# Patient Record
Sex: Female | Born: 1950
Health system: Southern US, Community
[De-identification: ages and names within clinical notes are randomized; demographics above are authoritative.]

## PROBLEM LIST (undated history)

## (undated) DIAGNOSIS — E785 Hyperlipidemia, unspecified: Secondary | ICD-10-CM

## (undated) DIAGNOSIS — M8580 Other specified disorders of bone density and structure, unspecified site: Secondary | ICD-10-CM

## (undated) DIAGNOSIS — H269 Unspecified cataract: Secondary | ICD-10-CM

## (undated) DIAGNOSIS — F419 Anxiety disorder, unspecified: Secondary | ICD-10-CM

## (undated) DIAGNOSIS — K219 Gastro-esophageal reflux disease without esophagitis: Secondary | ICD-10-CM

## (undated) DIAGNOSIS — T7840XA Allergy, unspecified, initial encounter: Secondary | ICD-10-CM

## (undated) HISTORY — DX: Gastro-esophageal reflux disease without esophagitis: K21.9

## (undated) HISTORY — PX: EYE SURGERY: SHX253

## (undated) HISTORY — DX: Other specified disorders of bone density and structure, unspecified site: M85.80

## (undated) HISTORY — DX: Unspecified cataract: H26.9

## (undated) HISTORY — DX: Hyperlipidemia, unspecified: E78.5

## (undated) HISTORY — DX: Allergy, unspecified, initial encounter: T78.40XA

## (undated) HISTORY — PX: BREAST EXCISIONAL BIOPSY: SUR124

## (undated) HISTORY — PX: OTHER SURGICAL HISTORY: SHX169

---

## 1998-10-31 ENCOUNTER — Ambulatory Visit (HOSPITAL_COMMUNITY): Admission: RE | Admit: 1998-10-31 | Discharge: 1998-10-31 | Payer: Self-pay | Admitting: Obstetrics and Gynecology

## 1998-10-31 ENCOUNTER — Encounter: Payer: Self-pay | Admitting: Obstetrics and Gynecology

## 1998-11-02 ENCOUNTER — Other Ambulatory Visit: Admission: RE | Admit: 1998-11-02 | Discharge: 1998-11-02 | Payer: Self-pay | Admitting: Obstetrics and Gynecology

## 1999-04-12 ENCOUNTER — Other Ambulatory Visit: Admission: RE | Admit: 1999-04-12 | Discharge: 1999-04-12 | Payer: Self-pay | Admitting: Obstetrics and Gynecology

## 2000-05-13 ENCOUNTER — Ambulatory Visit (HOSPITAL_COMMUNITY): Admission: RE | Admit: 2000-05-13 | Discharge: 2000-05-13 | Payer: Self-pay | Admitting: Obstetrics and Gynecology

## 2000-05-13 ENCOUNTER — Encounter: Payer: Self-pay | Admitting: Obstetrics and Gynecology

## 2001-10-01 ENCOUNTER — Encounter: Payer: Self-pay | Admitting: Family Medicine

## 2001-10-01 ENCOUNTER — Ambulatory Visit (HOSPITAL_COMMUNITY): Admission: RE | Admit: 2001-10-01 | Discharge: 2001-10-01 | Payer: Self-pay | Admitting: Family Medicine

## 2003-01-03 ENCOUNTER — Encounter: Payer: Self-pay | Admitting: Family Medicine

## 2003-01-03 ENCOUNTER — Ambulatory Visit (HOSPITAL_COMMUNITY): Admission: RE | Admit: 2003-01-03 | Discharge: 2003-01-03 | Payer: Self-pay | Admitting: Family Medicine

## 2004-01-25 ENCOUNTER — Ambulatory Visit (HOSPITAL_COMMUNITY): Admission: RE | Admit: 2004-01-25 | Discharge: 2004-01-25 | Payer: Self-pay | Admitting: Family Medicine

## 2005-01-16 ENCOUNTER — Ambulatory Visit: Payer: Self-pay | Admitting: Family Medicine

## 2005-01-17 ENCOUNTER — Other Ambulatory Visit: Admission: RE | Admit: 2005-01-17 | Discharge: 2005-01-17 | Payer: Self-pay | Admitting: Internal Medicine

## 2005-01-17 ENCOUNTER — Ambulatory Visit: Payer: Self-pay | Admitting: Family Medicine

## 2005-02-19 DIAGNOSIS — Z780 Asymptomatic menopausal state: Secondary | ICD-10-CM | POA: Insufficient documentation

## 2005-02-19 DIAGNOSIS — M8580 Other specified disorders of bone density and structure, unspecified site: Secondary | ICD-10-CM

## 2005-02-19 DIAGNOSIS — M899 Disorder of bone, unspecified: Secondary | ICD-10-CM

## 2005-02-19 DIAGNOSIS — M949 Disorder of cartilage, unspecified: Secondary | ICD-10-CM

## 2005-02-19 HISTORY — DX: Other specified disorders of bone density and structure, unspecified site: M85.80

## 2005-03-05 ENCOUNTER — Encounter: Admission: RE | Admit: 2005-03-05 | Discharge: 2005-03-05 | Payer: Self-pay | Admitting: Family Medicine

## 2005-03-13 ENCOUNTER — Ambulatory Visit: Payer: Self-pay | Admitting: Family Medicine

## 2005-03-18 ENCOUNTER — Ambulatory Visit: Payer: Self-pay | Admitting: Family Medicine

## 2005-08-07 ENCOUNTER — Ambulatory Visit: Payer: Self-pay | Admitting: Family Medicine

## 2005-10-07 ENCOUNTER — Ambulatory Visit: Payer: Self-pay | Admitting: Family Medicine

## 2005-10-24 ENCOUNTER — Ambulatory Visit: Payer: Self-pay

## 2005-11-20 ENCOUNTER — Ambulatory Visit: Payer: Self-pay | Admitting: Family Medicine

## 2006-01-13 ENCOUNTER — Ambulatory Visit: Payer: Self-pay | Admitting: Family Medicine

## 2006-07-22 ENCOUNTER — Encounter (INDEPENDENT_AMBULATORY_CARE_PROVIDER_SITE_OTHER): Payer: Self-pay | Admitting: Internal Medicine

## 2006-07-23 ENCOUNTER — Other Ambulatory Visit: Admission: RE | Admit: 2006-07-23 | Discharge: 2006-07-23 | Payer: Self-pay | Admitting: Family Medicine

## 2006-07-23 ENCOUNTER — Ambulatory Visit: Payer: Self-pay | Admitting: Family Medicine

## 2006-07-23 ENCOUNTER — Encounter (INDEPENDENT_AMBULATORY_CARE_PROVIDER_SITE_OTHER): Payer: Self-pay | Admitting: Internal Medicine

## 2006-08-13 ENCOUNTER — Ambulatory Visit: Payer: Self-pay | Admitting: Internal Medicine

## 2006-08-13 ENCOUNTER — Encounter: Admission: RE | Admit: 2006-08-13 | Discharge: 2006-08-13 | Payer: Self-pay | Admitting: Family Medicine

## 2006-08-22 DIAGNOSIS — K5730 Diverticulosis of large intestine without perforation or abscess without bleeding: Secondary | ICD-10-CM

## 2006-08-22 HISTORY — PX: COLONOSCOPY: SHX174

## 2006-08-29 ENCOUNTER — Ambulatory Visit: Payer: Self-pay | Admitting: Internal Medicine

## 2006-10-15 ENCOUNTER — Ambulatory Visit: Payer: Self-pay | Admitting: Family Medicine

## 2006-10-15 LAB — CONVERTED CEMR LAB
Cholesterol: 206 mg/dL (ref 0–200)
HDL: 86.5 mg/dL (ref >39.0)
Total CHOL/HDL Ratio: 2.4
Triglycerides: 85 mg/dL (ref 0–149)

## 2006-10-21 ENCOUNTER — Ambulatory Visit: Payer: Self-pay | Admitting: Family Medicine

## 2007-04-09 ENCOUNTER — Encounter (INDEPENDENT_AMBULATORY_CARE_PROVIDER_SITE_OTHER): Payer: Self-pay | Admitting: Internal Medicine

## 2007-04-09 DIAGNOSIS — R32 Unspecified urinary incontinence: Secondary | ICD-10-CM

## 2007-04-09 DIAGNOSIS — E785 Hyperlipidemia, unspecified: Secondary | ICD-10-CM

## 2007-06-08 ENCOUNTER — Ambulatory Visit: Payer: Self-pay | Admitting: Family Medicine

## 2007-06-08 DIAGNOSIS — F4321 Adjustment disorder with depressed mood: Secondary | ICD-10-CM | POA: Insufficient documentation

## 2007-06-30 ENCOUNTER — Ambulatory Visit (HOSPITAL_BASED_OUTPATIENT_CLINIC_OR_DEPARTMENT_OTHER): Admission: RE | Admit: 2007-06-30 | Discharge: 2007-06-30 | Payer: Self-pay | Admitting: Urology

## 2007-08-07 ENCOUNTER — Telehealth (INDEPENDENT_AMBULATORY_CARE_PROVIDER_SITE_OTHER): Payer: Self-pay | Admitting: *Deleted

## 2007-08-24 ENCOUNTER — Telehealth (INDEPENDENT_AMBULATORY_CARE_PROVIDER_SITE_OTHER): Payer: Self-pay | Admitting: Internal Medicine

## 2007-09-08 ENCOUNTER — Ambulatory Visit: Payer: Self-pay | Admitting: Family Medicine

## 2007-09-08 DIAGNOSIS — N951 Menopausal and female climacteric states: Secondary | ICD-10-CM

## 2007-09-09 ENCOUNTER — Encounter (INDEPENDENT_AMBULATORY_CARE_PROVIDER_SITE_OTHER): Payer: Self-pay | Admitting: Internal Medicine

## 2007-09-29 ENCOUNTER — Telehealth (INDEPENDENT_AMBULATORY_CARE_PROVIDER_SITE_OTHER): Payer: Self-pay | Admitting: Internal Medicine

## 2007-10-05 ENCOUNTER — Telehealth (INDEPENDENT_AMBULATORY_CARE_PROVIDER_SITE_OTHER): Payer: Self-pay | Admitting: Internal Medicine

## 2007-10-08 ENCOUNTER — Encounter (INDEPENDENT_AMBULATORY_CARE_PROVIDER_SITE_OTHER): Payer: Self-pay | Admitting: Internal Medicine

## 2007-10-12 ENCOUNTER — Encounter (INDEPENDENT_AMBULATORY_CARE_PROVIDER_SITE_OTHER): Payer: Self-pay | Admitting: Internal Medicine

## 2007-10-12 ENCOUNTER — Other Ambulatory Visit: Admission: RE | Admit: 2007-10-12 | Discharge: 2007-10-12 | Payer: Self-pay | Admitting: Family Medicine

## 2007-10-12 ENCOUNTER — Ambulatory Visit: Payer: Self-pay | Admitting: Family Medicine

## 2007-10-13 ENCOUNTER — Encounter (INDEPENDENT_AMBULATORY_CARE_PROVIDER_SITE_OTHER): Payer: Self-pay | Admitting: Internal Medicine

## 2007-10-19 ENCOUNTER — Encounter (INDEPENDENT_AMBULATORY_CARE_PROVIDER_SITE_OTHER): Payer: Self-pay | Admitting: Internal Medicine

## 2007-10-19 ENCOUNTER — Encounter (INDEPENDENT_AMBULATORY_CARE_PROVIDER_SITE_OTHER): Payer: Self-pay | Admitting: *Deleted

## 2007-11-03 ENCOUNTER — Encounter (INDEPENDENT_AMBULATORY_CARE_PROVIDER_SITE_OTHER): Payer: Self-pay | Admitting: *Deleted

## 2007-11-03 ENCOUNTER — Encounter (INDEPENDENT_AMBULATORY_CARE_PROVIDER_SITE_OTHER): Payer: Self-pay | Admitting: Internal Medicine

## 2007-11-13 ENCOUNTER — Ambulatory Visit: Payer: Self-pay | Admitting: Family Medicine

## 2007-11-13 ENCOUNTER — Encounter (INDEPENDENT_AMBULATORY_CARE_PROVIDER_SITE_OTHER): Payer: Self-pay | Admitting: Internal Medicine

## 2007-11-16 LAB — CONVERTED CEMR LAB
AST: 19 units/L (ref 0–37)
Basophils Absolute: 0 10*3/uL (ref 0.0–0.1)
Basophils Relative: 0.1 % (ref 0.0–1.0)
CO2: 31 meq/L (ref 19–32)
Creatinine, Ser: 0.7 mg/dL (ref 0.4–1.2)
Eosinophils Absolute: 0.4 10*3/uL (ref 0.0–0.7)
Eosinophils Relative: 5.1 % — ABNORMAL HIGH (ref 0.0–5.0)
GFR calc Af Amer: 112 mL/min
GFR calc non Af Amer: 93 mL/min
Glucose, Bld: 100 mg/dL — ABNORMAL HIGH (ref 70–99)
HDL: 78.1 mg/dL (ref >39.0)
Hemoglobin: 14.8 g/dL (ref 12.0–15.0)
MCHC: 35.5 g/dL (ref 30.0–36.0)
MCV: 97.4 fL (ref 78.0–100.0)
Monocytes Absolute: 0.6 10*3/uL (ref 0.1–1.0)
Monocytes Relative: 7.9 % (ref 3.0–12.0)
Neutro Abs: 4.9 10*3/uL (ref 1.4–7.7)
Neutrophils Relative %: 66 % (ref 43.0–77.0)
Platelets: 254 10*3/uL (ref 150–400)
Potassium: 4.3 meq/L (ref 3.5–5.1)
WBC: 7.4 10*3/uL (ref 4.5–10.5)

## 2007-11-18 LAB — CONVERTED CEMR LAB: Vit D, 1,25-Dihydroxy: 30 (ref 30–89)

## 2007-12-28 ENCOUNTER — Telehealth (INDEPENDENT_AMBULATORY_CARE_PROVIDER_SITE_OTHER): Payer: Self-pay | Admitting: Internal Medicine

## 2008-01-31 ENCOUNTER — Encounter: Payer: Self-pay | Admitting: Family Medicine

## 2008-03-10 ENCOUNTER — Encounter (INDEPENDENT_AMBULATORY_CARE_PROVIDER_SITE_OTHER): Payer: Self-pay | Admitting: Internal Medicine

## 2008-03-15 ENCOUNTER — Telehealth: Payer: Self-pay | Admitting: Family Medicine

## 2008-03-30 ENCOUNTER — Telehealth (INDEPENDENT_AMBULATORY_CARE_PROVIDER_SITE_OTHER): Payer: Self-pay | Admitting: Internal Medicine

## 2008-04-01 ENCOUNTER — Telehealth (INDEPENDENT_AMBULATORY_CARE_PROVIDER_SITE_OTHER): Payer: Self-pay | Admitting: Internal Medicine

## 2008-04-15 ENCOUNTER — Telehealth: Payer: Self-pay | Admitting: Family Medicine

## 2008-04-21 ENCOUNTER — Ambulatory Visit: Payer: Self-pay | Admitting: Family Medicine

## 2008-04-21 DIAGNOSIS — Z888 Allergy status to other drugs, medicaments and biological substances status: Secondary | ICD-10-CM

## 2008-04-21 DIAGNOSIS — J309 Allergic rhinitis, unspecified: Secondary | ICD-10-CM | POA: Insufficient documentation

## 2008-05-03 ENCOUNTER — Telehealth (INDEPENDENT_AMBULATORY_CARE_PROVIDER_SITE_OTHER): Payer: Self-pay | Admitting: Internal Medicine

## 2008-05-17 ENCOUNTER — Ambulatory Visit: Payer: Self-pay | Admitting: Family Medicine

## 2008-05-20 LAB — CONVERTED CEMR LAB
Albumin: 4.1 g/dL (ref 3.5–5.2)
HDL: 82.1 mg/dL (ref >39.0)
Total Protein: 6.7 g/dL (ref 6.0–8.3)
VLDL: 12 mg/dL (ref 0–40)

## 2008-08-23 ENCOUNTER — Ambulatory Visit: Payer: Self-pay | Admitting: Family Medicine

## 2008-08-23 DIAGNOSIS — F411 Generalized anxiety disorder: Secondary | ICD-10-CM

## 2008-08-23 DIAGNOSIS — F419 Anxiety disorder, unspecified: Secondary | ICD-10-CM | POA: Insufficient documentation

## 2008-09-12 ENCOUNTER — Telehealth (INDEPENDENT_AMBULATORY_CARE_PROVIDER_SITE_OTHER): Payer: Self-pay | Admitting: Internal Medicine

## 2008-09-22 ENCOUNTER — Telehealth (INDEPENDENT_AMBULATORY_CARE_PROVIDER_SITE_OTHER): Payer: Self-pay | Admitting: Internal Medicine

## 2008-11-02 ENCOUNTER — Telehealth (INDEPENDENT_AMBULATORY_CARE_PROVIDER_SITE_OTHER): Payer: Self-pay | Admitting: Internal Medicine

## 2009-02-24 ENCOUNTER — Telehealth (INDEPENDENT_AMBULATORY_CARE_PROVIDER_SITE_OTHER): Payer: Self-pay | Admitting: Internal Medicine

## 2009-10-18 ENCOUNTER — Encounter: Payer: Self-pay | Admitting: Family Medicine

## 2009-10-18 LAB — CONVERTED CEMR LAB: LDL Cholesterol: 105 mg/dL

## 2010-02-28 ENCOUNTER — Telehealth: Payer: Self-pay | Admitting: Family Medicine

## 2010-03-08 ENCOUNTER — Encounter: Payer: Self-pay | Admitting: Family Medicine

## 2010-03-19 ENCOUNTER — Ambulatory Visit: Payer: Self-pay | Admitting: Family Medicine

## 2010-03-19 ENCOUNTER — Other Ambulatory Visit: Admission: RE | Admit: 2010-03-19 | Discharge: 2010-03-19 | Payer: Self-pay | Admitting: Family Medicine

## 2010-03-19 DIAGNOSIS — R5383 Other fatigue: Secondary | ICD-10-CM

## 2010-03-19 DIAGNOSIS — R5381 Other malaise: Secondary | ICD-10-CM

## 2010-03-19 LAB — CONVERTED CEMR LAB
BUN: 11 mg/dL (ref 6–23)
Basophils Relative: 0.4 % (ref 0.0–3.0)
Calcium: 9 mg/dL (ref 8.4–10.5)
Chloride: 103 meq/L (ref 96–112)
Eosinophils Absolute: 0.2 10*3/uL (ref 0.0–0.7)
Eosinophils Relative: 4.3 % (ref 0.0–5.0)
Folate: 19.4 ng/mL
Monocytes Absolute: 0.5 10*3/uL (ref 0.1–1.0)
Monocytes Relative: 8.3 % (ref 3.0–12.0)
Neutrophils Relative %: 56.7 % (ref 43.0–77.0)
Platelets: 230 10*3/uL (ref 150.0–400.0)
Potassium: 4.3 meq/L (ref 3.5–5.1)
TSH: 1.51 microintl units/mL (ref 0.35–5.50)
Vitamin B-12: 384 pg/mL (ref 211–911)

## 2010-03-27 ENCOUNTER — Encounter: Payer: Self-pay | Admitting: Family Medicine

## 2010-08-23 NOTE — Assessment & Plan Note (Signed)
Summary: PAP SMEAR AND CPX/BILLIE'S PT/CLE   Vital Signs:  Patient profile:   60 year old female Height:      60.75 inches Weight:      123.38 pounds BMI:     23.59 Temp:     98.2 degrees F oral Pulse rate:   68 / minute Pulse rhythm:   regular BP sitting:   114 / 80  (left arm) Cuff size:   regular  Vitals Entered By: Sydell Axon LPN (March 19, 2010 8:23 AM) CC: 30 Minute checkup with pap   History of Present Illness: 60 yo female new to me here for CPX/Pap.  Hyperlipidemia- well controlled on Simvastatin 20 mg daily.  Denies any myalgias.  Brings in blood work from 09/2009.  Well woman- menopausal for past 3 years.  On Estratest daily for past 3 years, helped with hotflashes and vaginal dryness. No h/o abnormal pap smears. Due for mammogram.  Fatigue- does feel more fatigued than usual although she has been "running around a lot." No CP, SOB, DOE, HA or blurred vision. No weight loss or night sweats.  Current Medications (verified): 1)  Simvastatin 20 Mg  Tabs (Simvastatin) .... Take 1 Tablet By Mouth Once A Day 2)  Calcium 600/vitamin D 600-200 Mg-Unit  Tabs (Calcium Carbonate-Vitamin D) .... Take 1 Tablet By Mouth Two Times A Day 3)  Alprazolam 0.25 Mg  Tabs (Alprazolam) .Marland Kitchen.. 1 Every 6 Hours As Needed For Anxiety By Mouth 4)  Vitamin D 1000 Unit  Tabs (Cholecalciferol) .... Take 1 Tablet By Mouth Once A Day 5)  Flonase 50 Mcg/act Susp (Fluticasone Propionate) .... 2 Sprays Each Nostril Once Daily For Allergic Rhinitis 6)  Estratest Hs .Marland Kitchen.. 1 Once Daily For Hrt  Allergies: 1)  ! * Amoxil  Past History:  Past Medical History: Last updated: 04/09/2007 Osteopenia (02/19/2005) Diverticulosis, colon (08/22/2006) Hyperlipidemia Urinary incontinence  Past Surgical History: Last updated: 04/09/2007 colonoscopy-- 2/08 cardiolite stress test-- neg-- 4/07 bone densometry-- 8/06  Family History: Last updated: 04/09/2007 01/16/05 father living age 22 CVA age 45,  smoked, several MI's/CABG mother-- died age 62-- MI x2,  elevated lipids, smoker PGF died,-- DM gallbladder prob PGM died-- MI age 32 MGM died-- breast CA MGF died MI--  age 81+ 2 brothers living --1 w/ DM, HBP, does not take care of self, 2 MI/stents + CABG, depression/ elevated lipids 1 brother w/ elevated lipids sisters 5 living-- 1 w/ HBP, depression, abd prob., wt loss 90 lbs, smokes/ETOH 1 obese, others l + w  Social History: Last updated: 04/09/2007 walks married 28 years children 2-- (24/22)  Risk Factors: Alcohol Use: <1 (10/12/2007) Caffeine Use: 2 (10/12/2007) Exercise: yes (10/12/2007)  Risk Factors: Smoking Status: never (04/09/2007) Passive Smoke Exposure: no (10/12/2007)  Review of Systems      See HPI General:  Complains of fatigue; denies fever and loss of appetite. Eyes:  Denies blurring. ENT:  Denies difficulty swallowing. CV:  Denies chest pain or discomfort. Resp:  Denies shortness of breath. GI:  Denies abdominal pain, bloody stools, and change in bowel habits. GU:  Denies abnormal vaginal bleeding, discharge, and dysuria. MS:  Denies joint pain, joint redness, and joint swelling. Derm:  Denies rash. Neuro:  Denies headaches. Psych:  Denies anxiety and depression. Endo:  Denies cold intolerance and heat intolerance. Heme:  Denies abnormal bruising and bleeding.  Physical Exam  General:  alert, well-developed, well-nourished, and well-hydrated.   Head:  normocephalic, atraumatic, and no abnormalities observed.   Eyes:  pupils equal and pupils round.   Ears:  External ear exam shows no significant lesions or deformities.  Otoscopic examination reveals clear canals, tympanic membranes are intact bilaterally without bulging, retraction, inflammation or discharge. Hearing is grossly normal bilaterally. Nose:  External nasal examination shows no deformity or inflammation. Nasal mucosa are pink and moist without lesions or exudates. Mouth:  no exudates  and pharyngeal erythema.   Breasts:  skin/areolae normal, no masses, no abnormal thickening, no nipple discharge, no tenderness, and no adenopathy.  pendulous Lungs:  Normal respiratory effort, chest expands symmetrically. Lungs are clear to auscultation, no crackles or wheezes. Heart:  normal rate, regular rhythm, no murmur, and no gallop.  r Abdomen:  Bowel sounds positive,abdomen soft and non-tender without masses, organomegaly or hernias noted. Rectal:  no external abnormalities and no hemorrhoids.   Genitalia:  Normal introitus for age, no external lesions, no vaginal discharge, mucosa pink and moist, no vaginal or cervical lesions, no vaginal atrophy, no friaility or hemorrhage, normal uterus size and position, no adnexal masses or tenderness Msk:  No deformity or scoliosis noted of thoracic or lumbar spine.   Extremities:  No clubbing, cyanosis, edema, or deformity noted with normal full range of motion of all joints.   Neurologic:  alert & oriented X3, strength normal in all extremities, sensation intact to light touch, gait normal, and DTRs symmetrical and normal.   Psych:  Cognition and judgment appear intact. Alert and cooperative with normal attention span and concentration. No apparent delusions, illusions, hallucinations   Impression & Recommendations:  Problem # 1:  WELL ADULT EXAM (ICD-V70.0) Reviewed preventive care protocols, scheduled due services, and updated immunizations Discussed nutrition, exercise, diet, and healthy lifestyle.  Pap smear today.    Problem # 2:  FATIGUE (ICD-780.79) Assessment: New Likely due to busy lifestyle, but will check labs to rule out reverisble causes. Orders: Venipuncture ([address]) TLB-CBC Platelet - w/Differential (85025-CBCD) TLB-TSH (Thyroid Stimulating Hormone) (84443-TSH) TLB-BMP (Basic Metabolic Panel-BMET) (80048-METABOL) TLB-B12 + Folate Pnl (60454_09811-B14/NWG)  Complete Medication List: 1)  Simvastatin 20 Mg Tabs  (Simvastatin) .... Take 1 tablet by mouth once a day 2)  Calcium 600/vitamin D 600-200 Mg-unit Tabs (Calcium carbonate-vitamin d) .... Take 1 tablet by mouth two times a day 3)  Alprazolam 0.25 Mg Tabs (Alprazolam) .Marland Kitchen.. 1 every 6 hours as needed for anxiety by mouth 4)  Vitamin D 1000 Unit Tabs (Cholecalciferol) .... Take 1 tablet by mouth once a day 5)  Flonase 50 Mcg/act Susp (Fluticasone propionate) .... 2 sprays each nostril once daily for allergic rhinitis 6)  Estratest Hs  .Marland Kitchen.. 1 once daily for hrt  Current Allergies (reviewed today): ! * AMOXIL  Flu Vaccine Next Due:  Not Indicated Last HDL:  82.1 (05/17/2008 8:28:00 AM) HDL Result Date:  10/18/2009 HDL Result:  83 HDL Next Due:  1 yr Last LDL:  DEL (05/17/2008 8:28:00 AM) LDL Result Date:  10/18/2009 LDL Result:  105 LDL Next Due:  1 yr Flex Sig Next Due:  Not Indicated Hemoccult Next Due:  Not Indicated Last Mammogram:  normal (11/03/2007 4:39:54 PM) Mammogram Result Date:  03/01/2010 Mammogram Result:  normal Mammogram Next Due:  1 yr  Prevention & Chronic Care Immunizations   Influenza vaccine: Not documented   Influenza vaccine due: Not Indicated    Tetanus booster: 01/16/2005: Td   Tetanus booster due: 01/17/2015    Pneumococcal vaccine: Not documented  Colorectal Screening   Hemoccult: Not documented   Hemoccult due: Not Indicated  Colonoscopy: Abnormal  (08/22/2006)   Colonoscopy due: 08/2016  Other Screening   Pap smear: normal  (10/08/2007)   Pap smear action/deferral: Ordered  (03/19/2010)   Pap smear due: 10/2008    Mammogram: normal  (03/01/2010)   Mammogram due: 03/02/2011   Smoking status: never  (04/09/2007)  Lipids   Total Cholesterol: 220  (05/17/2008)   Lipid panel action/deferral: Not indicated   LDL: 105  (10/18/2009)   LDL Direct: 113.1  (05/17/2008)   HDL: 83  (10/18/2009)   Triglycerides: 59  (05/17/2008)    SGOT (AST): 21  (05/17/2008)   BMP action: Not indicated   SGPT  (ALT): 19  (05/17/2008)   Alkaline phosphatase: 44  (05/17/2008)   Total bilirubin: 0.8  (05/17/2008)    Lipid flowsheet reviewed?: Yes   Progress toward LDL goal: At goal  Self-Management Support :    Lipid self-management support: Not documented    Nursing Instructions: Pap smear today

## 2010-08-23 NOTE — Progress Notes (Signed)
Summary: needs order for mammogram  Phone Note Call from Patient Call back at Home Phone [phone]   Caller: Patient Call For: Gildardo Griffes FNP Summary of Call: Patient is asking if she could have an order faxed over to the breast center at [facility]. Her appt has already been scheduled, they just need an order.  She has an appt scheduled w/ you on 03-08-10.  Initial call taken by: Melody Comas,  February 28, 2010 1:53 PM

## 2010-08-23 NOTE — Letter (Signed)
Summary: Results Follow up Letter  [place] [facility]  [address] [place], [place] [address]   Phone: [phone]  Fax: [phone]    03/27/2010 MRN: [id number]  [facility] [address] [place], [place]  [address]  Dear Ms. Pettus,  The following are the results of your recent test(s):  Test         Result    Pap Smear:        Normal __X___  Not Normal _____ Comments: ______________________________________________________ Cholesterol: LDL(Bad cholesterol):         Your goal is less than:         HDL (Good cholesterol):       Your goal is more than: Comments:  ______________________________________________________ Mammogram:        Normal _____  Not Normal _____ Comments:  ___________________________________________________________________ Hemoccult:        Normal _____  Not normal _______ Comments:    _____________________________________________________________________ Other Tests:    We routinely do not discuss normal results over the telephone.  If you desire a copy of the results, or you have any questions about this information we can discuss them at your next office visit.   Sincerely,      Ruthe Mannan, MD

## 2010-11-26 ENCOUNTER — Telehealth: Payer: Self-pay | Admitting: *Deleted

## 2010-11-26 MED ORDER — CONJ ESTROG-MEDROXYPROGEST ACE 0.3-1.5 MG PO TABS: 1.0000 | ORAL_TABLET | Freq: Every day | ORAL | Status: DC

## 2010-11-26 NOTE — Telephone Encounter (Signed)
Patient has been taking Estratest for the past 3 years, but she is asking if for this months she could try prempro to see how that works for her. Uses [facility] [address]

## 2010-11-26 NOTE — Telephone Encounter (Signed)
Rx sent 

## 2010-11-26 NOTE — Telephone Encounter (Signed)
Patient advised as instructed via telephone, Rx sent to pharmacy.

## 2010-11-27 ENCOUNTER — Telehealth: Payer: Self-pay | Admitting: *Deleted

## 2010-11-27 NOTE — Telephone Encounter (Signed)
Opened in error

## 2010-12-04 ENCOUNTER — Ambulatory Visit (INDEPENDENT_AMBULATORY_CARE_PROVIDER_SITE_OTHER): Payer: BC Managed Care – PPO | Admitting: Family Medicine

## 2010-12-04 ENCOUNTER — Encounter: Payer: Self-pay | Admitting: Family Medicine

## 2010-12-04 VITALS — BP 130/90 | HR 79 | Temp 98.7°F | Ht 60.5 in | Wt 122.8 lb

## 2010-12-04 DIAGNOSIS — F4321 Adjustment disorder with depressed mood: Secondary | ICD-10-CM

## 2010-12-04 DIAGNOSIS — F411 Generalized anxiety disorder: Secondary | ICD-10-CM

## 2010-12-04 LAB — HM PAP SMEAR

## 2010-12-04 LAB — HM MAMMOGRAPHY

## 2010-12-04 MED ORDER — VITAMIN D 1000 UNITS PO TABS: 1000.0000 [IU] | ORAL_TABLET | Freq: Every day | ORAL | Status: DC

## 2010-12-04 MED ORDER — CALCIUM CARBONATE-VITAMIN D 600-400 MG-UNIT PO TABS: 1.0000 | ORAL_TABLET | Freq: Two times a day (BID) | ORAL | Status: DC

## 2010-12-04 MED ORDER — BIOTIN 1000 MCG PO TABS: 1000.0000 ug | ORAL_TABLET | Freq: Every day | ORAL | Status: DC

## 2010-12-04 MED ORDER — CITALOPRAM HYDROBROMIDE 10 MG PO TABS: 10.0000 mg | ORAL_TABLET | Freq: Every day | ORAL | Status: DC

## 2010-12-04 NOTE — Progress Notes (Signed)
60 yo here for depression.  Has never been on any antidepressants. Husband was deployed overseas for 3rd time and this time has been harder on her. They have less communication with each other so she is worried. Not having panic attacks but does feel anxious. Frequently tearful. No SI or HI. Sleeping ok. Does not want psychotherapy--says she has a good support system in her family and friends.  The PMH, PSH, Social History, Family History, Medications, and allergies have been reviewed in [facility], and have been updated if relevant.   Review of Systems       See HPI General:  Complains of fatigue; denies fever and loss of appetite. Eyes:  Denies blurring. ENT:  Denies difficulty swallowing. CV:  Denies chest pain or discomfort. Resp:  Denies shortness of breath. GI:  Denies abdominal pain, bloody stools, and change in bowel habits. GU:  Denies abnormal vaginal bleeding, discharge, and dysuria. MS:  Denies joint pain, joint redness, and joint swelling. Derm:  Denies rash. Neuro:  Denies headaches. Psych:  Complains of anxiety and depression. Denies SI or HI Endo:  Denies cold intolerance and heat intolerance. Heme:  Denies abnormal bruising and bleeding.  Physical Exam BP 130/90  Pulse 79  Temp(Src) 98.7 F (37.1 C) (Oral)  Ht 5' 0.5" (1.537 m)  Wt 122 lb 12.8 oz (55.702 kg)  BMI 23.59 kg/m2  General:  alert, well-developed, well-nourished, and well-hydrated.   Head:  normocephalic, atraumatic, and no abnormalities observed.   Eyes:  pupils equal and pupils round.   Lungs:  Normal respiratory effort, chest expands symmetrically. Lungs are clear to auscultation, no crackles or wheezes. Heart:  normal rate, regular rhythm, no murmur, and no gallop.  r Abdomen:  Bowel sounds positive,abdomen soft and non-tender without masses, organomegaly or hernias noted. Extremities:  No clubbing, cyanosis, edema, or deformity noted with normal full range of motion of all joints.   Neurologic:   alert & oriented X3, strength normal in all extremities, sensation intact to light touch, gait normal, and DTRs symmetrical and normal.   Psych:  Cognition and judgment appear intact. Alert and cooperative with normal attention span and concentration. No apparent delusions, illusions, hallucinations  A/P- adjustment disorder with mixed features. >25 min spent with face to face with patient counseling and coordinating care Will start low dose Celexa 10 mg daily. Pt to call me in a few weeks with an update. See pt instruction for details.

## 2010-12-04 NOTE — Patient Instructions (Signed)
Have fun at the graduation this weekend. Please call me in a few weeks to let me know how you're feeling.  IMPORTANT: HOW TO USE THIS INFORMATION:  This is a summary and does NOT have all possible information about this product. This information does not assure that this product is safe, effective, or appropriate for you. This information is not individual medical advice and does not substitute for the advice of your health care professional. Always ask your health care professional for complete information about this product and your specific health needs.    CITALOPRAM - ORAL (sye-TAL-oh-pram)    COMMON BRAND NAME(S): Celexa    WARNING:  Antidepressant medications are used to treat a variety of conditions, including depression and other mental/mood disorders. These medications can help prevent suicidal thoughts/attempts and provide other important benefits. However, studies have shown that a small number of people (especially people younger than 17) who take antidepressants for any condition may experience worsening depression, other mental/mood symptoms, or suicidal thoughts/attempts. Therefore, it is very important to talk with the doctor about the risks and benefits of antidepressant medication (especially for people younger than 25), even if treatment is not for a mental/mood condition. Tell the doctor immediately if you notice worsening depression/other psychiatric conditions, unusual behavior changes (including possible suicidal thoughts/attempts), or other mental/mood changes (including new/worsening anxiety, panic attacks, trouble sleeping, irritability, hostile/angry feelings, impulsive actions, severe restlessness, very rapid speech). Be especially watchful for these symptoms when a new antidepressant is started or when the dose is changed.    USES:  Citalopram is an antidepressant (selective serotonin reuptake inhibitor-SSRI) used to treat depression. It works by restoring the balance of  certain natural substances (neurotransmitters such as serotonin) in the brain. Citalopram may improve your feelings of well-being and energy level.    OTHER USES:  This section contains uses of this drug that are not listed in the approved professional labeling for the drug but that may be prescribed by your health care professional. Use this drug for a condition that is listed in this section only if it has been so prescribed by your health care professional. This drug is also used to treat other mental conditions (obsessive-compulsive disorder, panic disorder).    HOW TO USE:  Read the Medication Guide provided by your pharmacist before you start using citalopram and each time you get a refill. If you have any questions, consult your doctor or pharmacist. Take this medication once daily in the morning or evening, with or without food or as directed by your doctor. The dosage is based on your medical condition and response to treatment. If you are using the liquid form of this medication, measure the dose carefully using a special measuring device/spoon. Do not use a household spoon because you may not get the correct dose. To reduce your risk of side effects, your doctor may direct you to start taking this drug at a low dose and gradually increase your dose. Follow your doctor's instructions carefully. Do not take more or less medication or take it more frequently than prescribed. Your condition will not improve any faster, and your risk of side effects will increase. Use this medication regularly in order to get the most benefit from it. To help you remember, use it at the same time each day. It is important to continue taking this medication even if you feel well. Do not stop taking this medication without consulting your doctor. Some conditions may become worse when the drug is suddenly  stopped. Your dose may need to be gradually decreased. This medication may cause withdrawal reactions, especially if it  has been used regularly for a long time or in high doses. In such cases, withdrawal symptoms (such as nervousness, headache, numbness, tingling, trouble sleeping, nightmares) may occur if you suddenly stop using this medication. To prevent withdrawal reactions, your doctor may reduce your dose gradually. Consult your doctor or pharmacist for more details, and report any withdrawal reactions immediately. It may take 1 to 2 weeks to feel a benefit from this drug and 4 weeks to feel the full benefit of this medication. Tell your doctor if your condition persists or worsens.    SIDE EFFECTS:  See also Warning section. Nausea, dry mouth, trouble sleeping, loss of appetite, weakness, tiredness, drowsiness, dizziness, increased sweating, blurred vision, or yawning may occur. If any of these effects persist or worsen, tell your doctor promptly. Remember that your doctor has prescribed this medication because he or she has judged that the benefit to you is greater than the risk of side effects. Many people using this medication do not have serious side effects. Tell your doctor immediately if any of these unlikely but serious side effects occur: unusual or severe mental/mood changes (e.g., nervousness, unusual high energy/excitement, rare thoughts of suicide), shakiness (tremor), decreased interest in sex, changes in sexual ability. Tell your doctor immediately if any of these rare but very serious side effects occur: stomach pain, fainting, bloody/black/tarry stools, vomit that looks like coffee grounds, easy bruising/bleeding, muscle weakness/cramps, seizures, fast/irregular/pounding heartbeat, difficulty urinating, change in the amount of urine. This medication may rarely cause a very serious condition called serotonin syndrome. The risk increases when this medication is used with certain other drugs such as "triptans" used to treat migraine headaches (e.g., sumatriptan, eletriptan), certain antidepressants including  other SSRIs (e.g., fluoxetine, paroxetine) and SNRIs (e.g., venlafaxine), lithium, tramadol, tryptophan, or a certain drug to treat obesity (sibutramine). See also Drug Interactions section. Before taking this drug, tell your doctor if you take any of these medications. Serotonin syndrome may be more likely when you start or increase the dose of any of these medications. Seek immediate medical attention if you develop some of the following symptoms: hallucinations, restlessness, loss of coordination, fast heartbeat, severe dizziness, unexplained fever, severe nausea/vomiting/diarrhea, twitchy muscles. For males, in the very unlikely event you have a painful or prolonged erection lasting 4 or more hours, stop using this drug and seek immediate medical attention, or permanent problems could occur. A very serious allergic reaction to this drug is rare. However, seek immediate medical attention if you notice any symptoms of a serious allergic reaction, including: rash, itching/swelling (especially of the face/tongue/throat), severe dizziness, trouble breathing. This is not a complete list of possible side effects. If you notice other effects not listed above, contact your doctor or pharmacist. In the [place] - Call your doctor for medical advice about side effects. You may report side effects to FDA at 1-800-FDA-1088. In [place] - Call your doctor for medical advice about side effects. You may report side effects to [facility] [place] at [phone].    PRECAUTIONS:  Before taking citalopram, tell your doctor or pharmacist if you are allergic to it; or to escitalopram; or if you have any other allergies. This product may contain inactive ingredients, which can cause allergic reactions or other problems. Talk to your pharmacist for more details. Before using this medication, tell your doctor or pharmacist your medical history, especially of: personal or family history of  psychiatric disorders (e.g., bipolar/manic-depressive  disorder), personal or family history of suicide attempts, bleeding problems, liver disease, seizures, severe kidney disease, stomach bleeding, severe loss of body water (dehydration), low sodium in the blood (hyponatremia). This drug may make you dizzy or drowsy. Do not drive, use machinery, or do any activity that requires alertness until you are sure you can perform such activities safely. Avoid alcoholic beverages. Caution is advised when using this drug in the elderly because they may be more sensitive to its effects. The elderly are more likely to lose too much salt (hyponatremia), especially if they are also taking "water pills" (diuretics) with this medication. Use this medication only when clearly needed during pregnancy. It may harm an unborn baby. Also, babies born to mothers who have used this drug during the last 3 months of pregnancy may infrequently develop withdrawal symptoms such as feeding/breathing difficulties, seizures, muscle stiffness, or constant crying. If you notice any of these symptoms in your newborn, tell the doctor promptly. Since untreated depression can be a serious condition, do not stop taking this medication unless directed by your doctor. If you are planning pregnancy, become pregnant, or think you may be pregnant, immediately discuss the benefits and risks of using this medication during pregnancy with your doctor. This drug passes into breast milk and may have undesirable effects on a nursing infant. Consult your doctor before breast-feeding.    DRUG INTERACTIONS:  Your doctor or pharmacist may already be aware of any possible drug interactions and may be monitoring you for them. Do not start, stop, or change the dosage of any medicine before checking with your doctor or pharmacist first. Taking certain medications with this product could result in serious (rarely fatal) drug interactions. Avoid taking MAO inhibitors (e.g., furazolidone, isocarboxazid, linezolid, moclobemide,  phenelzine, procarbazine, rasagiline, selegiline, tranylcypromine) with citalopram for 2 weeks before treatment, during treatment, or for 2 weeks after your last dose of citalopram. This drug should not be used with the following medications because very serious interactions may occur: pimozide, tryptophan, weight loss drugs (e.g., sibutramine, phentermine). If you are currently using any of these medications, tell your doctor or pharmacist before starting citalopram. Before using this medication, tell your doctor or pharmacist of all prescription and nonprescription/herbal products you may use, especially of: cimetidine, metoprolol, "water pills" (diuretics such as furosemide), drugs that can cause bleeding/bruising (e.g., aspirin, antiplatelet drugs such as clopidogrel, NSAIDs such as ibuprofen, "blood thinners" such as heparin/warfarin). Aspirin can increase the risk of bleeding when used with this medication (see above). If your doctor has directed you to take low-dose aspirin for heart attack or stroke prevention (usually at dosages of 81-325 milligrams a day), you should continue taking it unless your doctor instructs you otherwise. Discuss the risks and benefits with your doctor. Also tell your doctor if you take any other drugs that increase serotonin, such as buspirone, dextromethorphan, lithium, meperidine, propoxyphene, phentermine, other SSRIs (e.g., paroxetine), SNRIs (e.g., duloxetine), tryptophan, St. John's wort, drugs used to treat migraines such as "triptans" and dihydroergotamine, street drugs such as MDMA/"ecstasy," amphetamine. (See also Side Effects section.) Tell your doctor or pharmacist if you also take drugs that cause drowsiness, such as certain antihistamines (e.g., diphenhydramine), anti-seizure drugs (e.g., carbamazepine), medicine for sleep or anxiety (e.g., lorazepam, zolpidem), muscle relaxants, narcotic pain relievers (e.g., codeine), psychiatric medicines (e.g., chlorpromazine,  quetiapine, nortriptyline, trazodone). Check the labels on all your medicines (e.g., cough-and-cold products) because they may contain ingredients that cause drowsiness. Cimetidine is a nonprescription drug  that is commonly used to treat extra stomach acid. Because it may cause undesirable interactions when used with citalopram, ask your pharmacist about other products to treat stomach acid. This document does not contain all possible interactions. Therefore, before using this product, tell your doctor or pharmacist of all the products you use. Keep a list of all your medications with you, and share the list with your doctor and pharmacist.    OVERDOSE:  If overdose is suspected, contact your local poison control center or emergency room immediately. [place] residents can call the [place] National Poison Hotline at 848-155-3500. [place] residents can call a Technical sales engineer center.    NOTES:  Do not share this medication with others. Psychiatric/medical check-ups and possibly lab tests should be done periodically to monitor your progress or check for side effects. Consult your doctor for more details.    MISSED DOSE:  If you miss a dose, take it as soon as you remember. If it is near the time of the next dose, skip the missed dose and resume your usual dosing schedule. Do not double the dose to catch up.    STORAGE:  Store at room temperature at 77 degrees F (25 degrees C) away from light and moisture. Brief storage between 59-86 degrees F (15-30 degrees C) is permitted. Do not store in the bathroom. Keep all medicines away from children and pets. Do not flush medications down the toilet or pour them into a drain unless instructed to do so. Properly discard this product when it is expired or no longer needed. Consult your pharmacist or local waste disposal company for more details about how to safely discard your product.    Information last revised October 2010 Copyright(c) 2010 [employer].

## 2010-12-04 NOTE — Op Note (Signed)
NAMEAMYRE, Patricia Hampton           ACCOUNT NO.:  [id number]   MEDICAL RECORD NO.:  [id number]          PATIENT TYPE:  AMB   LOCATION:  [facility]                         FACILITY:  [facility]   PHYSICIAN:  Martina Sinner, MD DATE OF BIRTH:  11/24/1950   DATE OF PROCEDURE:  06/30/2007  DATE OF DISCHARGE:                               OPERATIVE REPORT   PREOPERATIVE DIAGNOSIS:  Stress incontinence.   POSTOPERATIVE DIAGNOSES:  Stress incontinence.   SURGERY:  Sling, cystourethropexy (SPARC plus cystoscopy).   SURGEON:  Dr. Lorin Picket MacDiarmid   Patricia Hampton has stress urinary incontinence.  She consented to a  sling.   The patient was prepped and draped in the usual fashion.  Preoperative  lab and blood were, of course, normal.  She was given preoperative  antibiotics.  Extra care was taken when we positioned her legs to  minimize the risk of compartment syndrome and DVT and neuropathy.   Two 1 cm incisions were made 0.5 cm lateral to the midline 1  fingerbreadth above the symphysis pubis where she had a previous  Pfannenstiel incision.  I made a 2 cm incision under the mid urethra  after instilling approximately 7 mL of a lidocaine/epinephrine mixture.  I made the incision such that there was a thick pubis urethral flap.  I  dissected sharply to the urethrovesical angle bilaterally.   With the bladder emptied, I passed the [facility] needle on top all the way  in the back of the symphysis pubis parallel to the midline onto the pulp  of my index finger bilaterally.  I cystoscoped the patient.  I felt that  the left needle was a little bit close to the bladder since I could see  it easily, indent the bladder.  For this reason, I placed approximately  0.5 cm more lateral.  I then recystoscoped the patient, and there was no  indentation of the bladder with the flexion of the needle.  It was  effluxing some indigo carmine from both ureteral orifices.  There was no  injury to the urethra.  I  had a very good look inside the bladder, and I  was happy with the position of the trocars.   With the bladder empty, I attached the sling and brought it up through  the retropubic space,  I tensioned it over the fat part of a midsize  Kelly clamp.  A couple of the blue dots, irrigated the seeds, and  removed the seeds.  I was very happy with the hypermobility of the  sling.  I was happy with the tension with no spring-back effect.  It  nicely sat in the mid urethra.  Importantly, she did have a short  urethra, and I was diligent to try to stay in the middle aspect of it.   All incisions were irrigated.  Then 2-0 Vicryl on a CT-1 needle was used  to close the vaginal incisions followed by 1 interrupted suture.  I cut  the sling below the skin and closed the suprapubic incisions with 4-0  Vicryl and Dermabond.   The catheter was draining blue urine at the  end of the case.  The  patient was taken to the recovery room.  Hopefully, this operation will  reach her treatment goal.           ______________________________  Martina Sinner, MD  Electronically Signed     SAM/MEDQ  D:  06/30/2007  T:  06/30/2007  Job:  [id number]

## 2010-12-13 ENCOUNTER — Other Ambulatory Visit: Payer: Self-pay | Admitting: *Deleted

## 2010-12-13 MED ORDER — SIMVASTATIN 20 MG PO TABS: 20.0000 mg | ORAL_TABLET | Freq: Every day | ORAL | Status: DC

## 2010-12-26 ENCOUNTER — Telehealth: Payer: Self-pay | Admitting: *Deleted

## 2010-12-26 NOTE — Telephone Encounter (Signed)
[facility] has faxed forms for new scripts for citalopram, simvastatin, prempro.  Forms are on your desk.  Please check and or correct quantities and refills.

## 2010-12-27 NOTE — Telephone Encounter (Signed)
Completed on my desk

## 2011-01-17 MED ORDER — CITALOPRAM HYDROBROMIDE 10 MG PO TABS: 10.0000 mg | ORAL_TABLET | Freq: Every day | ORAL | Status: DC

## 2011-01-17 MED ORDER — CONJ ESTROG-MEDROXYPROGEST ACE 0.3-1.5 MG PO TABS: 1.0000 | ORAL_TABLET | Freq: Every day | ORAL | Status: DC

## 2011-01-17 MED ORDER — SIMVASTATIN 20 MG PO TABS: 20.0000 mg | ORAL_TABLET | Freq: Every day | ORAL | Status: DC

## 2011-01-21 ENCOUNTER — Telehealth: Payer: Self-pay | Admitting: *Deleted

## 2011-01-21 MED ORDER — CITALOPRAM HYDROBROMIDE 20 MG PO TABS: 20.0000 mg | ORAL_TABLET | Freq: Every day | ORAL | Status: DC

## 2011-01-21 NOTE — Telephone Encounter (Signed)
Rx called to pharmacy, patient notified as instructed via message left on cell phone voicemail.

## 2011-01-21 NOTE — Telephone Encounter (Signed)
Patient is currently taking celexa 10 mg tablets. She says that it is not working and is asking if she could increase it. Uses [facility] [place].

## 2011-01-21 NOTE — Telephone Encounter (Signed)
Yes, I will send 20 mg dose to her pharmacy. Please let me know how she is doing in a few weeks.

## 2011-02-20 ENCOUNTER — Telehealth: Payer: Self-pay | Admitting: *Deleted

## 2011-02-20 NOTE — Telephone Encounter (Signed)
Patient notified

## 2011-02-20 NOTE — Telephone Encounter (Signed)
Pt states she has been taking citalopram for about 2 months, takes 20 mg's a day, and she says this isn't working well for her.  She is asking if she can increase the dose.  [facility] [place]

## 2011-02-20 NOTE — Telephone Encounter (Signed)
Yes try taking two tablets daily- 40 mg. If that helps, we can call in higher dose.

## 2011-04-02 ENCOUNTER — Other Ambulatory Visit: Payer: Self-pay | Admitting: *Deleted

## 2011-04-02 MED ORDER — CONJ ESTROG-MEDROXYPROGEST ACE 0.3-1.5 MG PO TABS: 1.0000 | ORAL_TABLET | Freq: Every day | ORAL | Status: DC

## 2011-04-02 NOTE — Telephone Encounter (Signed)
Pt is requesting a 90 day supply of prempro be [facility] [place].

## 2011-04-08 NOTE — Telephone Encounter (Signed)
Prempro called to [facility].

## 2011-04-22 ENCOUNTER — Other Ambulatory Visit: Payer: Self-pay | Admitting: *Deleted

## 2011-04-22 NOTE — Telephone Encounter (Signed)
Pt is requesting refill. Advised her that you are out until Wednesday, so you may not see this note till then. Please call pt when this is called in.

## 2011-04-23 MED ORDER — ALPRAZOLAM 0.25 MG PO TABS: 0.2500 mg | ORAL_TABLET | Freq: Four times a day (QID) | ORAL | Status: DC | PRN

## 2011-04-24 NOTE — Telephone Encounter (Signed)
[facility], patient notified as instructed via telephone. 

## 2011-04-29 LAB — TYPE AND SCREEN: ABO/RH(D): A NEG

## 2011-04-29 LAB — CBC
Hemoglobin: 14.7
RBC: 4.42
RDW: 11.5
WBC: 6

## 2011-04-29 LAB — ABO/RH: ABO/RH(D): A NEG

## 2011-04-29 LAB — PROTIME-INR
INR: 0.9
Prothrombin Time: 12.1

## 2011-04-29 LAB — APTT: aPTT: 27

## 2011-06-25 ENCOUNTER — Telehealth: Payer: Self-pay | Admitting: *Deleted

## 2011-06-25 NOTE — Telephone Encounter (Signed)
Yes I will

## 2011-06-25 NOTE — Telephone Encounter (Signed)
[facility] is called to advise that patient is scheduled for a screening mammogram on 07/02/11 and wants to make sure that you will accept the report. This does not require an order from a physician, but does need a doctor to confirm that they will receive the report.

## 2011-06-25 NOTE — Telephone Encounter (Signed)
Spoke with Herbert Seta at [facility] and advised her as instructed.

## 2011-07-02 ENCOUNTER — Encounter: Payer: Self-pay | Admitting: Family Medicine

## 2011-07-02 ENCOUNTER — Ambulatory Visit (INDEPENDENT_AMBULATORY_CARE_PROVIDER_SITE_OTHER): Payer: BC Managed Care – PPO | Admitting: Family Medicine

## 2011-07-02 DIAGNOSIS — N951 Menopausal and female climacteric states: Secondary | ICD-10-CM

## 2011-07-02 DIAGNOSIS — R6889 Other general symptoms and signs: Secondary | ICD-10-CM

## 2011-07-02 DIAGNOSIS — Z0000 Encounter for general adult medical examination without abnormal findings: Secondary | ICD-10-CM

## 2011-07-02 DIAGNOSIS — Z1211 Encounter for screening for malignant neoplasm of colon: Secondary | ICD-10-CM

## 2011-07-02 DIAGNOSIS — F411 Generalized anxiety disorder: Secondary | ICD-10-CM

## 2011-07-02 DIAGNOSIS — E785 Hyperlipidemia, unspecified: Secondary | ICD-10-CM

## 2011-07-02 DIAGNOSIS — M949 Disorder of cartilage, unspecified: Secondary | ICD-10-CM

## 2011-07-02 DIAGNOSIS — R5381 Other malaise: Secondary | ICD-10-CM

## 2011-07-02 LAB — HEPATIC FUNCTION PANEL
ALT: 33 U/L (ref 0–35)
AST: 27 U/L (ref 0–37)
Bilirubin, Direct: 0.1 mg/dL (ref 0.0–0.3)
Total Bilirubin: 0.8 mg/dL (ref 0.3–1.2)

## 2011-07-02 LAB — CBC WITH DIFFERENTIAL/PLATELET
Basophils Absolute: 0 10*3/uL (ref 0.0–0.1)
Eosinophils Absolute: 0.4 10*3/uL (ref 0.0–0.7)
HCT: 42.9 % (ref 36.0–46.0)
Hemoglobin: 15 g/dL (ref 12.0–15.0)
Lymphs Abs: 1.9 10*3/uL (ref 0.7–4.0)
MCHC: 35 g/dL (ref 30.0–36.0)
Monocytes Relative: 8.2 % (ref 3.0–12.0)
Neutro Abs: 3.9 10*3/uL (ref 1.4–7.7)
RDW: 12 % (ref 11.5–14.6)

## 2011-07-02 LAB — BASIC METABOLIC PANEL
CO2: 28 mEq/L (ref 19–32)
Calcium: 9.2 mg/dL (ref 8.4–10.5)
Creatinine, Ser: 0.7 mg/dL (ref 0.4–1.2)
Glucose, Bld: 103 mg/dL — ABNORMAL HIGH (ref 70–99)

## 2011-07-02 MED ORDER — CALCIUM CARBONATE-VITAMIN D 600-400 MG-UNIT PO TABS: 1.0000 | ORAL_TABLET | Freq: Two times a day (BID) | ORAL | Status: DC

## 2011-07-02 MED ORDER — SIMVASTATIN 20 MG PO TABS: 20.0000 mg | ORAL_TABLET | Freq: Every day | ORAL | Status: DC

## 2011-07-02 MED ORDER — BIOTIN 1000 MCG PO TABS: 1000.0000 ug | ORAL_TABLET | Freq: Every day | ORAL | Status: DC

## 2011-07-02 MED ORDER — VITAMIN D 1000 UNITS PO TABS: 1000.0000 [IU] | ORAL_TABLET | Freq: Every day | ORAL | Status: DC

## 2011-07-02 MED ORDER — ALPRAZOLAM 0.25 MG PO TABS: 0.2500 mg | ORAL_TABLET | Freq: Four times a day (QID) | ORAL | Status: DC | PRN

## 2011-07-02 MED ORDER — ESTRADIOL-NORETHINDRONE ACET 0.05-0.14 MG/DAY TD PTTW: 1.0000 | MEDICATED_PATCH | TRANSDERMAL | Status: DC

## 2011-07-02 NOTE — Patient Instructions (Addendum)
Great to see you. Please take one tablet of your nexium nightly. Call me with an update in 2 weeks. Have a wonderful Christmas.

## 2011-07-02 NOTE — Progress Notes (Signed)
60 yo here for CPX. Hyperlipidemia- well controlled on Simvastatin 20 mg daily.  Denies any myalgias.   Due for fasting labs.  Well woman- menopausal for past 4 1/2 years.  On Prempro which has helped with hotflashes and vaginal dryness. No h/o abnormal pap smears.  Mammogram scheduled for later today.  Husband is back from [place] so she feels she no longer needs celexa, stopped taking it.  Menopausal symptoms- hot flashes and night sweats improved with Prempro but still having decreased sex drive. Years ago was taking combipatch and would like to restart that again.  Feels like she is clearing her throat frequently. Does have a runny nose or other symptoms of allergic rhinitis. Does not feel sensation of acid in throat when she lies down. Husband reports she has done this for months. Food not getting stuck. Appetite good.  No nausea or vomiting.  Patient Active Problem List  Diagnoses  . HYPERLIPIDEMIA  . ANXIETY  . DEPRESSION, SITUATIONAL  . ALLERGIC RHINITIS  . DIVERTICULOSIS, COLON  . MENOPAUSAL SYNDROME  . OSTEOPENIA  . FATIGUE  . URINARY INCONTINENCE  . OTHER DRUG ALLERGY  . Routine general medical examination at a health care facility   Past Medical History  Diagnosis Date  . Osteopenia 02/19/2005  . Diverticulosis of colon 08/22/2006  . Hyperlipidemia   . Urinary incontinence    Past Surgical History  Procedure Date  . Colonoscopy 08/2006   History  Substance Use Topics  . Smoking status: Never Smoker   . Smokeless tobacco: Not on file  . Alcohol Use: Not on file   Family History  Problem Relation Age of Onset  . Hyperlipidemia Mother   . Stroke Father   . Hypertension Sister   . Depression Sister   . Alcohol abuse Sister   . Hyperlipidemia Brother   . Cancer Maternal Grandmother     breast  . Diabetes Paternal Grandfather   . Diabetes Brother   . Hypertension Brother   . Depression Brother   . Hyperlipidemia Brother   . Obesity Sister     Allergies  Allergen Reactions  . Amoxicillin    Current Outpatient Prescriptions on File Prior to Visit  Medication Sig Dispense Refill  . ALPRAZolam (XANAX) 0.25 MG tablet Take 1 tablet (0.25 mg total) by mouth every 6 (six) hours as needed.  30 tablet  0  . Biotin 1000 MCG tablet Take 1 tablet (1 mg total) by mouth daily.  180 tablet  3  . Calcium Carbonate-Vitamin D (CALCIUM 600/VITAMIN D) 600-400 MG-UNIT per tablet Take 1 tablet by mouth 2 (two) times daily.  180 tablet  3  . cholecalciferol (VITAMIN D) 1000 UNITS tablet Take 1 tablet (1,000 Units total) by mouth daily.  90 tablet  3  . citalopram (CELEXA) 20 MG tablet Take 1 tablet (20 mg total) by mouth daily.  90 tablet  0  . estrogen, conjugated,-medroxyprogesterone (PREMPRO) 0.3-1.5 MG per tablet Take 1 tablet by mouth daily.  90 tablet  0  . fluticasone (FLONASE) 50 MCG/ACT nasal spray 2 sprays by Nasal route daily.        . simvastatin (ZOCOR) 20 MG tablet Take 1 tablet (20 mg total) by mouth daily.  90 tablet  0   The PMH, PSH, Social History, Family History, Medications, and allergies have been reviewed in [facility], and have been updated if relevant.  ROS: See HPI Patient reports no  vision/ hearing changes,anorexia, weight change, fever ,adenopathy, persistant / recurrent hoarseness, swallowing  issues, chest pain, edema,persistant / recurrent cough, hemoptysis, dyspnea(rest, exertional, paroxysmal nocturnal), gastrointestinal  bleeding (melena, rectal bleeding), abdominal pain, excessive heart burn, GU symptoms(dysuria, hematuria, pyuria, voiding/incontinence  Issues) syncope, focal weakness, severe memory loss, concerning skin lesions, depression, anxiety, abnormal bruising/bleeding, major joint swelling, breast masses or abnormal vaginal bleeding.    Physical exam: BP 130/80  Pulse 78  Temp(Src) 98.1 F (36.7 C) (Oral)  Ht 5' 0.05" (1.525 m)  Wt 122 lb (55.339 kg)  BMI 23.79 kg/m2  General:   Well-developed,well-nourished,in no acute distress; alert,appropriate and cooperative throughout examination Head:  normocephalic and atraumatic.   Eyes:  vision grossly intact, pupils equal, pupils round, and pupils reactive to light.   Ears:  R ear normal and L ear normal.   Nose:  no external deformity.   Mouth:  good dentition.   Neck:  No deformities, masses, or tenderness noted. Lungs:  Normal respiratory effort, chest expands symmetrically. Lungs are clear to auscultation, no crackles or wheezes. Heart:  Normal rate and regular rhythm. S1 and S2 normal without gallop, murmur, click, rub or other extra sounds. Abdomen:  Bowel sounds positive,abdomen soft and non-tender without masses, organomegaly or hernias noted. Msk:  No deformity or scoliosis noted of thoracic or lumbar spine.   Extremities:  No clubbing, cyanosis, edema, or deformity noted with normal full range of motion of all joints.   Neurologic:  alert & oriented X3 and gait normal.   Skin:  Intact without suspicious lesions or rashes Cervical Nodes:  No lymphadenopathy noted Psych:  Cognition and judgment appear intact. Alert and cooperative with normal attention span and concentration. No apparent delusions, illusions, hallucinations  Assessment and Plan:  1. Routine general medical examination at a health care facility  Reviewed preventive care protocols, scheduled due services, and updated immunizations Discussed nutrition, exercise, diet, and healthy lifestyle.  Basic Metabolic Panel (BMET) IFOB  2. HYPERLIPIDEMIA  Stable, recheck labs today. Continue Simvastatin 20 mg qhs. Lipid Profile, Hepatic function panel  3. FATIGUE  Unchanged, likely due to life changes. Will check labs to rule out other possible reversible causes.  TSH, T4, free, CBC w/Diff, Vitamin D (25 hydroxy)  4. MENOPAUSAL SYNDROME   Improved but would like to switch to combipatch to see if it will help with sex drive. D/c Prempro.   5. Throat  clearing  New. ?laryngopharyngeal reflux. Will try Nexium- samples given.  Follow up in 2 weeks.

## 2011-07-03 ENCOUNTER — Encounter: Payer: Self-pay | Admitting: *Deleted

## 2011-07-04 ENCOUNTER — Encounter: Payer: Self-pay | Admitting: Family Medicine

## 2011-07-08 ENCOUNTER — Other Ambulatory Visit: Payer: Self-pay | Admitting: Family Medicine

## 2011-07-08 ENCOUNTER — Other Ambulatory Visit: Payer: BC Managed Care – PPO

## 2011-07-08 DIAGNOSIS — Z1211 Encounter for screening for malignant neoplasm of colon: Secondary | ICD-10-CM

## 2011-07-08 LAB — FECAL OCCULT BLOOD, IMMUNOCHEMICAL: Fecal Occult Bld: NEGATIVE

## 2011-07-09 ENCOUNTER — Encounter: Payer: Self-pay | Admitting: *Deleted

## 2011-08-01 ENCOUNTER — Other Ambulatory Visit: Payer: Self-pay | Admitting: Internal Medicine

## 2011-08-01 MED ORDER — ESTRADIOL-NORETHINDRONE ACET 0.05-0.14 MG/DAY TD PTTW: 1.0000 | MEDICATED_PATCH | TRANSDERMAL | Status: DC

## 2011-08-01 NOTE — Telephone Encounter (Signed)
Patient called and stated the Combipatch worked great and wanted to know if she could have a 3 month supply [facility] [address].

## 2011-08-26 ENCOUNTER — Telehealth: Payer: Self-pay | Admitting: *Deleted

## 2011-08-26 MED ORDER — ESOMEPRAZOLE MAGNESIUM 40 MG PO CPDR: 40.0000 mg | DELAYED_RELEASE_CAPSULE | Freq: Every day | ORAL | Status: DC

## 2011-08-26 NOTE — Telephone Encounter (Signed)
Patient was given samples of Nexium. Patient would like a 90 day script [facility].

## 2011-08-26 NOTE — Telephone Encounter (Signed)
rx sent

## 2011-08-26 NOTE — Telephone Encounter (Signed)
Patient advised as instructed via telephone. 

## 2011-09-12 ENCOUNTER — Other Ambulatory Visit: Payer: Self-pay

## 2011-09-12 ENCOUNTER — Telehealth: Payer: Self-pay | Admitting: Family Medicine

## 2011-09-12 MED ORDER — ESTRADIOL-NORETHINDRONE ACET 0.05-0.14 MG/DAY TD PTTW: 1.0000 | MEDICATED_PATCH | TRANSDERMAL | Status: DC

## 2011-09-12 NOTE — Telephone Encounter (Signed)
Ok to increase to 2 pills at a time.  Changed in med list and will route to PCP as fyi.

## 2011-09-12 NOTE — Telephone Encounter (Signed)
Triage Record Num: [id number] Operator: Di Kindle Patient Name: Patricia Hampton [facility]" [facility] Call Date & Time: 09/12/2011 10:01:12AM Patient Phone: [phone] PCP: Ruthe Mannan Patient Gender: Female PCP Fax : [phone] Patient DOB: 12-02-1950 Practice Name: [facility] Reason for Call: Caller: Madhavi/Patient; PCP: Ruthe Mannan [facility]); CB#: [phone]; Call regarding diarrhea; onset ~ 1 week of diarrhea, fatigue, low grade fever earlier. Symptoms reviewed Diarrhea, BM x 3-4 since midnight, only small amts, using Gatoraide, decreased appetite, now with a headache, with nausea, that has now improved with hives that have improved now. Questions dosage of Xanax that she uses as needed, dosage .25 to take 1 PO Q 6 hrs as needed, states last used 1-2 weeks ago, and she felt like she needed more, can she increase to take 2 at a times when she needs? Please call [phone]. OFFICE PLEASE MED Question: Xanax Protocol(s) Used: Diarrhea or Other Change in Bowel Habits Recommended Outcome per Protocol: Provide Home/Self Care Reason for Outcome: New onset diarrhea with any of the following: mild abdominal cramping, generalized aching, temperature up to 101.5 F (38.6 C) or several episodes of nausea/vomiting Care Advice: Antidiarrheal medications are usually unnecessary. Use only after consulting your provider. Application of A&D ointment or witch hazel medicated pads may help relieve anal irritation. ~ ~ Consult your provider for advice regarding continuing prescription medication. Go to the ED if you have developed signs and symptoms of dehydration such as very dry mouth and tongue; increased pulse rate at rest; no urine output for 8 hours or more; increasing weakness or drowsiness, or lightheadedness when trying to sit upright or standing. ~ Vomiting and Diarrhea Care: - Do not eat solid foods until vomiting subsides. - Begin taking fluids by sucking on ice chips or  popsicles or taking sips of cool, clear fluids (soda, fruit juices that are low acid, sports drinks or nonprescription oral rehydration solution). - Gradually drink larger amounts of these fluids so that you are drinking six to eight 8 oz. (1.2 to 1.6 liters) of fluids a day. - Keep activity to a minimum. - Once vomiting and diarrhea subside, eat smaller, more frequent meals of easily digested foods such as crackers, toast, bananas, rice, cooked cereal, applesauce, broth, baked or mashed potatoes, chicken or [place] without skin. Eat slowly. - Take fluids 30 minutes before or 60 minutes after meals. - Avoid high fat, highly seasoned, high fiber or high sugar content foods. - Avoid extremely hot or cold foods. - Do not take pain medication (such as aspirin, NSAIDs) while nauseated or vomiting. - Do not drink caffeinated or alcoholic beverages.

## 2011-09-12 NOTE — Telephone Encounter (Signed)
Pharmacy called to request a change in quantity on patient's Combipatch prescription.  Patient is wanting a 90 day supply now instead of 30 day.  I sent a new prescription with the change made.

## 2011-09-13 NOTE — Telephone Encounter (Signed)
Patient advised.

## 2012-02-13 ENCOUNTER — Other Ambulatory Visit: Payer: Self-pay

## 2012-02-13 MED ORDER — ALPRAZOLAM 0.25 MG PO TABS: ORAL_TABLET | ORAL | Status: DC

## 2012-02-13 NOTE — Telephone Encounter (Signed)
Pt request refill alprazolam 0.25 mg called to [facility].Please advise.

## 2012-02-13 NOTE — Telephone Encounter (Signed)
[facility] 

## 2012-02-22 ENCOUNTER — Emergency Department (HOSPITAL_BASED_OUTPATIENT_CLINIC_OR_DEPARTMENT_OTHER)
Admission: EM | Admit: 2012-02-22 | Discharge: 2012-02-22 | Disposition: A | Discharge status: Home / Self Care | Payer: BC Managed Care – PPO | Attending: Emergency Medicine | Admitting: Emergency Medicine

## 2012-02-22 ENCOUNTER — Encounter (HOSPITAL_BASED_OUTPATIENT_CLINIC_OR_DEPARTMENT_OTHER): Payer: Self-pay | Admitting: *Deleted

## 2012-02-22 DIAGNOSIS — Z209 Contact with and (suspected) exposure to unspecified communicable disease: Secondary | ICD-10-CM

## 2012-02-22 DIAGNOSIS — Z23 Encounter for immunization: Secondary | ICD-10-CM | POA: Insufficient documentation

## 2012-02-22 MED ORDER — RABIES VACCINE, PCEC IM SUSR
1.0000 mL | Freq: Once | INTRAMUSCULAR | Status: AC
  Administered 2012-02-22: 1 mL via INTRAMUSCULAR
  Filled 2012-02-22: qty 1

## 2012-02-22 MED ORDER — RABIES IMMUNE GLOBULIN 150 UNIT/ML IM INJ
20.0000 [IU]/kg | INJECTION | Freq: Once | INTRAMUSCULAR | Status: AC
  Administered 2012-02-22: 1200 [IU] via INTRAMUSCULAR

## 2012-02-22 MED ORDER — RABIES IMMUNE GLOBULIN 150 UNIT/ML IM INJ
INJECTION | INTRAMUSCULAR | Status: AC
  Filled 2012-02-22: qty 6

## 2012-02-22 MED ORDER — RABIES IMMUNE GLOBULIN 150 UNIT/ML IM INJ
INJECTION | INTRAMUSCULAR | Status: AC
  Administered 2012-02-22: 1200 [IU] via INTRAMUSCULAR
  Filled 2012-02-22: qty 2

## 2012-02-22 NOTE — ED Notes (Signed)
Patient states that a bat landed on her foot last night and concerned it bit her on her foot, no pain

## 2012-02-22 NOTE — ED Provider Notes (Signed)
History     CSN: [id number]  Arrival date & time 02/22/12  1228   First MD Initiated Contact with Patient 02/22/12 1246      Chief Complaint  Patient presents with  . Animal Bite    (Consider location/radiation/quality/duration/timing/severity/associated sxs/prior treatment) HPI Comments: Pt states that a bat landed on her foot last night and she is here for the rabies series  Patient is a 61 y.o. female presenting with animal bite. The history is provided by the patient. No language interpreter was used.  Animal Bite  The incident occurred yesterday. There is an injury to the left foot. The patient is experiencing no pain.    Past Medical History  Diagnosis Date  . Osteopenia 02/19/2005  . Hyperlipidemia   . Urinary incontinence     Past Surgical History  Procedure Date  . Colonoscopy 08/2006    Family History  Problem Relation Age of Onset  . Hyperlipidemia Mother   . Stroke Father   . Hypertension Sister   . Depression Sister   . Alcohol abuse Sister   . Hyperlipidemia Brother   . Cancer Maternal Grandmother     breast  . Diabetes Paternal Grandfather   . Diabetes Brother   . Hypertension Brother   . Depression Brother   . Hyperlipidemia Brother   . Obesity Sister     History  Substance Use Topics  . Smoking status: Never Smoker   . Smokeless tobacco: Not on file  . Alcohol Use: Yes    OB History    Grav Para Term Preterm Abortions TAB SAB Ect Mult Living                  Review of Systems  Constitutional: Negative.   Respiratory: Negative.   Cardiovascular: Negative.     Allergies  Amoxicillin  Home Medications   Current Outpatient Rx  Name Route Sig Dispense Refill  . ALPRAZOLAM 0.25 MG PO TABS  1-2 tab every 6 hours as needed for anxiety 30 tablet 0  . BIOTIN 1000 MCG PO TABS Oral Take 1 tablet (1 mg total) by mouth daily. 180 tablet 3  . CALCIUM CARBONATE-VITAMIN D 600-400 MG-UNIT PO TABS Oral Take 1 tablet by mouth 2 (two) times  daily. 180 tablet 3  . VITAMIN D 1000 UNITS PO TABS Oral Take 1 tablet (1,000 Units total) by mouth daily. 90 tablet 3  . ESOMEPRAZOLE MAGNESIUM 40 MG PO CPDR Oral Take 1 capsule (40 mg total) by mouth daily. 90 capsule 3  . ESTRADIOL-NORETHINDRONE ACET 0.05-0.14 MG/DAY TD PTTW Transdermal Place 1 patch onto the skin 2 (two) times a week. 24 patch 3  . SIMVASTATIN 20 MG PO TABS Oral Take 1 tablet (20 mg total) by mouth daily. 90 tablet 3    BP 125/76  Pulse 85  Temp 98 F (36.7 C) (Oral)  Resp 18  Wt 126 lb (57.153 kg)  Physical Exam  Nursing note and vitals reviewed. Constitutional: She appears well-developed and well-nourished.  Cardiovascular: Normal rate and regular rhythm.   Pulmonary/Chest: Effort normal and breath sounds normal.  Musculoskeletal: Normal range of motion.  Skin: Skin is warm and dry.    ED Course  Procedures (including critical care time)  Labs Reviewed - No data to display No results found.   1. Exposure to bat without known bite   2. Need for prophylactic vaccination against rabies       MDM  Pt given rabies series and follow up instructions  Teressa Lower, NP 02/22/12 1407

## 2012-02-22 NOTE — ED Provider Notes (Signed)
Medical screening examination/treatment/procedure(s) were performed by non-physician practitioner and as supervising physician I was immediately available for consultation/collaboration.   Carleene Cooper III, MD 02/22/12 [phone]

## 2012-02-24 ENCOUNTER — Telehealth: Payer: Self-pay | Admitting: Family Medicine

## 2012-02-24 NOTE — Telephone Encounter (Signed)
Dee, we do not have this is stock here, correct?  If so, please call pt to let her know that she needs to return to UC. Thanks.

## 2012-02-24 NOTE — Telephone Encounter (Signed)
Noted! Thank you

## 2012-02-24 NOTE — Telephone Encounter (Signed)
Spoke with patient and advised that we do not carry the rabies vaccine, that it wouldn't be cost effective for [place] to stock the rabies vaccine. Advised pt to try the health dept and she contacted them and they don't carry the vaccine either, they told her to contact the ER or UC, pt states she will, she tried to avoid the high co-pay.

## 2012-02-24 NOTE — Telephone Encounter (Signed)
Caller: Patricia Hampton/Patient; PCP: Ruthe Mannan ([facility]); CB#: [phone]; ; ; Call regarding Rabies Vaccine; Seen in ED 02/22/12; got rabies immune globulin, and first vaccine.  Told to come to [facility] for remainder of vaccine series.  States her copayment is cheaper at primary care office, and wants to know if office carries vaccine and can dose her.  RN unable to answer question; note to office for staff review/callback. MAY REACH PATIENT AT [phone].

## 2012-02-25 ENCOUNTER — Emergency Department (HOSPITAL_COMMUNITY)
Admission: EM | Admit: 2012-02-25 | Discharge: 2012-02-25 | Disposition: A | Discharge status: Home / Self Care | Payer: BC Managed Care – PPO | Source: Home / Self Care

## 2012-02-25 ENCOUNTER — Encounter (HOSPITAL_COMMUNITY): Payer: Self-pay | Admitting: *Deleted

## 2012-02-25 DIAGNOSIS — Z23 Encounter for immunization: Secondary | ICD-10-CM

## 2012-02-25 MED ORDER — RABIES VACCINE, PCEC IM SUSR
INTRAMUSCULAR | Status: AC
  Filled 2012-02-25: qty 1

## 2012-02-25 MED ORDER — RABIES VACCINE, PCEC IM SUSR
1.0000 mL | Freq: Once | INTRAMUSCULAR | Status: AC
  Administered 2012-02-25: 1 mL via INTRAMUSCULAR

## 2012-02-25 NOTE — ED Notes (Signed)
Here for 2nd rabies vaccine for a bat exposure.  States the bat landed on her L foot and looked sickly.  Her husband is a Administrator, [employer] and took it to be tested. Had 2 small dots on her foot but are not visible now.

## 2012-02-28 ENCOUNTER — Telehealth (HOSPITAL_COMMUNITY): Payer: Self-pay | Admitting: *Deleted

## 2012-02-28 NOTE — ED Notes (Signed)
Call pt. to remind her about rabies shot tomorrow @ 1100.

## 2012-02-29 ENCOUNTER — Emergency Department (HOSPITAL_COMMUNITY)
Admission: EM | Admit: 2012-02-29 | Discharge: 2012-02-29 | Disposition: A | Discharge status: Home / Self Care | Payer: BC Managed Care – PPO | Source: Home / Self Care

## 2012-02-29 ENCOUNTER — Encounter (HOSPITAL_COMMUNITY): Payer: Self-pay | Admitting: *Deleted

## 2012-02-29 HISTORY — DX: Anxiety disorder, unspecified: F41.9

## 2012-02-29 MED ORDER — RABIES VACCINE, PCEC IM SUSR
1.0000 mL | Freq: Once | INTRAMUSCULAR | Status: AC
  Administered 2012-02-29: 1 mL via INTRAMUSCULAR

## 2012-02-29 MED ORDER — RABIES VACCINE, PCEC IM SUSR
INTRAMUSCULAR | Status: AC
  Filled 2012-02-29: qty 1

## 2012-02-29 NOTE — ED Notes (Signed)
Patient here for Day 7 rabies injection.

## 2012-03-07 ENCOUNTER — Emergency Department (INDEPENDENT_AMBULATORY_CARE_PROVIDER_SITE_OTHER)
Admission: EM | Admit: 2012-03-07 | Discharge: 2012-03-07 | Disposition: A | Discharge status: Home / Self Care | Payer: BC Managed Care – PPO | Source: Home / Self Care

## 2012-03-07 DIAGNOSIS — Z23 Encounter for immunization: Secondary | ICD-10-CM

## 2012-03-07 MED ORDER — RABIES VACCINE, PCEC IM SUSR
1.0000 mL | Freq: Once | INTRAMUSCULAR | Status: AC
  Administered 2012-03-07: 1 mL via INTRAMUSCULAR

## 2012-03-07 MED ORDER — RABIES VACCINE, PCEC IM SUSR
INTRAMUSCULAR | Status: AC
  Filled 2012-03-07: qty 1

## 2012-05-01 ENCOUNTER — Telehealth: Payer: Self-pay | Admitting: *Deleted

## 2012-05-01 MED ORDER — TRETINOIN (FACIAL WRINKLES) 0.02 % EX CREA: TOPICAL_CREAM | CUTANEOUS | Status: DC

## 2012-05-01 NOTE — Telephone Encounter (Signed)
Pt calls requesting rx for Renova 0.02% cream. She was given a 60 gram sample of it (can't remember if it was from you or dermatologist) and now wants an rx called in. She has already called dermatologist but there were not accepting appointments until late November. Wants to know if you will prescribe. If so she request a written rx of the generic so she can purchase it online.

## 2012-05-01 NOTE — Telephone Encounter (Signed)
Script is on your desk

## 2012-05-01 NOTE — Telephone Encounter (Signed)
Yes it is not a medication that requiring lab monitoring so ok to refill. Ok to print out as entered.

## 2012-05-01 NOTE — Telephone Encounter (Signed)
Left message advising patient script is ready for pick up. 

## 2012-05-07 ENCOUNTER — Telehealth: Payer: Self-pay | Admitting: *Deleted

## 2012-05-07 MED ORDER — CETIRIZINE HCL 10 MG PO TABS: 10.0000 mg | ORAL_TABLET | Freq: Every day | ORAL | Status: DC

## 2012-05-07 NOTE — Telephone Encounter (Signed)
Pt calls stating she is going to d/c nexium because it is not working. She wants to try zyrtec for her post nasal drip. She request an written rx to pick up because her health savings account will cover it with an rx. She is coming to office to pick up another rx and would like to pick up the zyrtec rx with it.

## 2012-05-07 NOTE — Telephone Encounter (Signed)
Script given to patient

## 2012-05-07 NOTE — Telephone Encounter (Signed)
Zyrtec rx printed.

## 2012-05-14 ENCOUNTER — Other Ambulatory Visit: Payer: Self-pay | Admitting: *Deleted

## 2012-05-14 MED ORDER — ESTRADIOL-NORETHINDRONE ACET 0.05-0.14 MG/DAY TD PTTW: 1.0000 | MEDICATED_PATCH | TRANSDERMAL | Status: DC

## 2012-05-14 MED ORDER — SIMVASTATIN 20 MG PO TABS: 20.0000 mg | ORAL_TABLET | Freq: Every day | ORAL | Status: DC

## 2012-05-14 NOTE — Addendum Note (Signed)
Addended by: Liane Comber C on: 05/14/2012 01:30 PM   Modules accepted: Orders

## 2012-06-22 ENCOUNTER — Other Ambulatory Visit: Payer: Self-pay

## 2012-06-22 MED ORDER — ALPRAZOLAM 0.25 MG PO TABS: ORAL_TABLET | ORAL | Status: DC

## 2012-06-22 NOTE — Telephone Encounter (Signed)
[facility] 

## 2012-06-22 NOTE — Telephone Encounter (Signed)
Pt left v/m requesting refill [facility].Please advise.

## 2012-08-20 ENCOUNTER — Encounter: Payer: Self-pay | Admitting: Family Medicine

## 2012-08-20 ENCOUNTER — Encounter: Payer: Self-pay | Admitting: *Deleted

## 2012-08-20 ENCOUNTER — Telehealth: Payer: Self-pay

## 2012-08-20 ENCOUNTER — Ambulatory Visit (INDEPENDENT_AMBULATORY_CARE_PROVIDER_SITE_OTHER): Payer: BC Managed Care – PPO | Admitting: Family Medicine

## 2012-08-20 ENCOUNTER — Other Ambulatory Visit (HOSPITAL_COMMUNITY)
Admission: RE | Admit: 2012-08-20 | Discharge: 2012-08-20 | Disposition: A | Discharge status: Home / Self Care | Payer: BC Managed Care – PPO | Source: Ambulatory Visit | Attending: Family Medicine | Admitting: Family Medicine

## 2012-08-20 VITALS — BP 120/82 | HR 64 | Temp 97.9°F | Ht 60.75 in | Wt 127.0 lb

## 2012-08-20 DIAGNOSIS — E559 Vitamin D deficiency, unspecified: Secondary | ICD-10-CM

## 2012-08-20 DIAGNOSIS — Z1211 Encounter for screening for malignant neoplasm of colon: Secondary | ICD-10-CM

## 2012-08-20 DIAGNOSIS — K5730 Diverticulosis of large intestine without perforation or abscess without bleeding: Secondary | ICD-10-CM

## 2012-08-20 DIAGNOSIS — Z01419 Encounter for gynecological examination (general) (routine) without abnormal findings: Secondary | ICD-10-CM | POA: Insufficient documentation

## 2012-08-20 DIAGNOSIS — M949 Disorder of cartilage, unspecified: Secondary | ICD-10-CM

## 2012-08-20 DIAGNOSIS — E785 Hyperlipidemia, unspecified: Secondary | ICD-10-CM

## 2012-08-20 DIAGNOSIS — M899 Disorder of bone, unspecified: Secondary | ICD-10-CM

## 2012-08-20 DIAGNOSIS — Z0000 Encounter for general adult medical examination without abnormal findings: Secondary | ICD-10-CM

## 2012-08-20 DIAGNOSIS — R32 Unspecified urinary incontinence: Secondary | ICD-10-CM

## 2012-08-20 DIAGNOSIS — F411 Generalized anxiety disorder: Secondary | ICD-10-CM

## 2012-08-20 LAB — LIPID PANEL
HDL: 85.7 mg/dL (ref >39.00)
Triglycerides: 98 mg/dL (ref 0.0–149.0)
VLDL: 19.6 mg/dL (ref 0.0–40.0)

## 2012-08-20 LAB — COMPREHENSIVE METABOLIC PANEL
Alkaline Phosphatase: 34 U/L — ABNORMAL LOW (ref 39–117)
Creatinine, Ser: 0.7 mg/dL (ref 0.4–1.2)
Glucose, Bld: 100 mg/dL — ABNORMAL HIGH (ref 70–99)
Sodium: 138 mEq/L (ref 135–145)
Total Bilirubin: 0.9 mg/dL (ref 0.3–1.2)
Total Protein: 6.6 g/dL (ref 6.0–8.3)

## 2012-08-20 NOTE — Patient Instructions (Addendum)
Restart Zyrtec daily. Call me in 10 days with an update.  Please call to schedule your mammogram.

## 2012-08-20 NOTE — Telephone Encounter (Signed)
Pt was seen today and when got home noticed on AVS a long list of medical problems that were not discussed today. Pt said only talked about check up and hormone replacements. Pt wants to know why all med diagnosis listed for today's visit.Please advise.

## 2012-08-20 NOTE — Progress Notes (Signed)
62 yo very pleasant female here for CPX.  Last pap smear was normal in 02/2010.  No h/o abnormal pap smears.    Hyperlipidemia- well controlled on Simvastatin 20 mg daily.  Denies any myalgias.   Due for fasting labs.  Well woman- menopausal for past 4 1/2 years.  On combipatch which has helped with hotflashes and vaginal dryness. No h/o abnormal pap smears.  Denies any post menopausal bleeding.  Has not yet scheduled her mammogram this year.     . Patient Active Problem List  Diagnosis  . HYPERLIPIDEMIA  . ANXIETY  . DEPRESSION, SITUATIONAL  . ALLERGIC RHINITIS  . DIVERTICULOSIS, COLON  . OSTEOPENIA  . URINARY INCONTINENCE  . Routine general medical examination at a health care facility   Past Medical History  Diagnosis Date  . Osteopenia 02/19/2005  . Hyperlipidemia   . Urinary incontinence   . Anxiety    Past Surgical History  Procedure Date  . Colonoscopy 08/2006  . Cesarean section   . Repeat cesarean section   . Bladder tack    History  Substance Use Topics  . Smoking status: Never Smoker   . Smokeless tobacco: Not on file  . Alcohol Use: Yes   Family History  Problem Relation Age of Onset  . Hyperlipidemia Mother   . Stroke Father   . Hypertension Sister   . Depression Sister   . Alcohol abuse Sister   . Hyperlipidemia Brother   . Cancer Maternal Grandmother     breast  . Diabetes Paternal Grandfather   . Diabetes Brother   . Hypertension Brother   . Depression Brother   . Hyperlipidemia Brother   . Obesity Sister    No Known Allergies Current Outpatient Prescriptions on File Prior to Visit  Medication Sig Dispense Refill  . ALPRAZolam (XANAX) 0.25 MG tablet 1-2 tab every 6 hours as needed for anxiety  30 tablet  0  . Biotin 1000 MCG tablet Take 1 tablet (1 mg total) by mouth daily.  180 tablet  3  . Calcium Carbonate-Vitamin D (CALCIUM 600/VITAMIN D) 600-400 MG-UNIT per tablet Take 1 tablet by mouth 2 (two) times daily.  180 tablet  3  .  cetirizine (ZYRTEC) 10 MG tablet Take 1 tablet (10 mg total) by mouth daily.  30 tablet  11  . cholecalciferol (VITAMIN D) 1000 UNITS tablet Take 1 tablet (1,000 Units total) by mouth daily.  90 tablet  3  . esomeprazole (NEXIUM) 40 MG capsule Take 1 capsule (40 mg total) by mouth daily.  90 capsule  3  . estradiol-norethindrone (COMBIPATCH) 0.05-0.14 MG/DAY Place 1 patch onto the skin 2 (two) times a week.  24 patch  3  . fish oil-omega-3 fatty acids 1000 MG capsule Take 2 g by mouth daily.      . simvastatin (ZOCOR) 20 MG tablet Take 1 tablet (20 mg total) by mouth daily.  90 tablet  0  . Tretinoin, Facial Wrinkles, 0.02 % CREA Apply topically qhs  1 Tube  0   The PMH, PSH, Social History, Family History, Medications, and allergies have been reviewed in [facility], and have been updated if relevant.  ROS: See HPI Patient reports no  vision/ hearing changes,anorexia, weight change, fever ,adenopathy, persistant / recurrent hoarseness, swallowing issues, chest pain, edema,persistant / recurrent cough, hemoptysis, dyspnea(rest, exertional, paroxysmal nocturnal), gastrointestinal  bleeding (melena, rectal bleeding), abdominal pain, excessive heart burn, GU symptoms(dysuria, hematuria, pyuria, voiding/incontinence  Issues) syncope, focal weakness, severe memory loss, concerning skin  lesions, depression, anxiety, abnormal bruising/bleeding, major joint swelling, breast masses or abnormal vaginal bleeding.    Physical exam: BP 120/82  Pulse 64  Temp 97.9 F (36.6 C)  Ht 5' 0.75" (1.543 m)  Wt 127 lb (57.607 kg)  BMI 24.19 kg/m2   General:  Well-developed,well-nourished,in no acute distress; alert,appropriate and cooperative throughout examination Head:  normocephalic and atraumatic.   Eyes:  vision grossly intact, pupils equal, pupils round, and pupils reactive to light.   Ears:  R ear normal and L ear normal.   Nose:  no external deformity.   Mouth:  good dentition.   Neck:  No deformities, masses,  or tenderness noted. Breasts:  No mass, nodules, thickening, tenderness, bulging, retraction, inflamation, nipple discharge or skin changes noted.   Lungs:  Normal respiratory effort, chest expands symmetrically. Lungs are clear to auscultation, no crackles or wheezes. Heart:  Normal rate and regular rhythm. S1 and S2 normal without gallop, murmur, click, rub or other extra sounds. Abdomen:  Bowel sounds positive,abdomen soft and non-tender without masses, organomegaly or hernias noted. Rectal:  no external abnormalities.   Genitalia:  Pelvic Exam:        External: normal female genitalia without lesions or masses        Vagina: normal without lesions or masses        Cervix: normal without lesions or masses        Adnexa: normal bimanual exam without masses or fullness        Uterus: normal by palpation        Pap smear: performed Msk:  No deformity or scoliosis noted of thoracic or lumbar spine.   Extremities:  No clubbing, cyanosis, edema, or deformity noted with normal full range of motion of all joints.   Neurologic:  alert & oriented X3 and gait normal.   Skin:  Intact without suspicious lesions or rashes Cervical Nodes:  No lymphadenopathy noted Axillary Nodes:  No palpable lymphadenopathy Psych:  Cognition and judgment appear intact. Alert and cooperative with normal attention span and concentration. No apparent delusions, illusions, hallucinations   Assessment and Plan:  1. Routine general medical examination at a health care facility  Reviewed preventive care protocols, scheduled due services, and updated immunizations Discussed nutrition, exercise, diet, and healthy lifestyle.  Orders Placed This Encounter  Procedures  . Fecal occult blood, imunochemical  . Lipid Panel  . Comprehensive metabolic panel  Pap smear   2. HYPERLIPIDEMIA  Stable, recheck labs today. Continue Simvastatin 20 mg qhs.   3. MENOPAUSAL SYNDROME   Stable with combipatch       4.  Throat  clearing- Did see some improvement with Zyrtec.  She will restart this and call me.  If no improvement, refer to GI for endoscopy.

## 2012-08-21 ENCOUNTER — Encounter: Payer: Self-pay | Admitting: Family Medicine

## 2012-08-21 NOTE — Telephone Encounter (Signed)
Spoke with patient, she says she understands that her problem list is a summary, that will print on every AVS sheet.

## 2012-08-24 ENCOUNTER — Encounter: Payer: Self-pay | Admitting: Family Medicine

## 2012-08-24 ENCOUNTER — Encounter: Payer: Self-pay | Admitting: *Deleted

## 2012-08-24 LAB — HM PAP SMEAR: HM Pap smear: NORMAL

## 2012-08-25 ENCOUNTER — Encounter: Payer: Self-pay | Admitting: Family Medicine

## 2012-08-26 ENCOUNTER — Encounter: Payer: Self-pay | Admitting: Family Medicine

## 2012-08-26 ENCOUNTER — Encounter: Payer: Self-pay | Admitting: *Deleted

## 2012-08-26 LAB — HM MAMMOGRAPHY: HM Mammogram: NORMAL

## 2012-09-14 ENCOUNTER — Other Ambulatory Visit: Payer: Self-pay | Admitting: *Deleted

## 2012-09-14 MED ORDER — SIMVASTATIN 20 MG PO TABS: 20.0000 mg | ORAL_TABLET | Freq: Every day | ORAL | Status: DC

## 2012-09-15 ENCOUNTER — Other Ambulatory Visit: Payer: Self-pay | Admitting: *Deleted

## 2012-09-15 MED ORDER — BIOTIN 1000 MCG PO TABS: 1000.0000 ug | ORAL_TABLET | Freq: Every day | ORAL | Status: DC

## 2012-10-15 ENCOUNTER — Other Ambulatory Visit: Payer: Self-pay | Admitting: Family Medicine

## 2012-10-15 ENCOUNTER — Encounter: Payer: Self-pay | Admitting: Family Medicine

## 2012-10-16 MED ORDER — TRETINOIN (FACIAL WRINKLES) 0.02 % EX CREA: TOPICAL_CREAM | CUTANEOUS | Status: DC

## 2012-10-16 NOTE — Telephone Encounter (Signed)
Medicine called to [facility], but I had to change to 0.025% so that patient could get generic.

## 2012-11-05 ENCOUNTER — Other Ambulatory Visit: Payer: Self-pay | Admitting: Family Medicine

## 2012-11-05 ENCOUNTER — Other Ambulatory Visit (INDEPENDENT_AMBULATORY_CARE_PROVIDER_SITE_OTHER): Payer: Self-pay

## 2012-11-05 MED ORDER — ALPRAZOLAM 0.25 MG PO TABS: ORAL_TABLET | ORAL | Status: DC

## 2012-11-09 ENCOUNTER — Encounter: Payer: Self-pay | Admitting: Family Medicine

## 2012-11-09 MED ORDER — CONJ ESTROG-MEDROXYPROGEST ACE 0.3-1.5 MG PO TABS: 1.0000 | ORAL_TABLET | Freq: Every day | ORAL | Status: DC

## 2012-12-31 ENCOUNTER — Encounter: Payer: Self-pay | Admitting: Family Medicine

## 2013-01-05 ENCOUNTER — Other Ambulatory Visit: Payer: Self-pay | Admitting: Family Medicine

## 2013-01-05 MED ORDER — ESTRADIOL-NORETHINDRONE ACET 0.05-0.14 MG/DAY TD PTTW: 1.0000 | MEDICATED_PATCH | TRANSDERMAL | Status: DC

## 2013-02-03 ENCOUNTER — Other Ambulatory Visit: Payer: Self-pay | Admitting: Family Medicine

## 2013-02-17 ENCOUNTER — Encounter: Payer: Self-pay | Admitting: Family Medicine

## 2013-03-02 ENCOUNTER — Other Ambulatory Visit: Payer: Self-pay | Admitting: Family Medicine

## 2013-04-19 ENCOUNTER — Encounter: Payer: Self-pay | Admitting: Family Medicine

## 2013-04-19 DIAGNOSIS — J329 Chronic sinusitis, unspecified: Secondary | ICD-10-CM

## 2013-05-03 ENCOUNTER — Other Ambulatory Visit: Payer: Self-pay | Admitting: Family Medicine

## 2013-05-11 ENCOUNTER — Other Ambulatory Visit: Payer: Self-pay | Admitting: Family Medicine

## 2013-05-11 NOTE — Telephone Encounter (Signed)
Ok to refill by phone it- thx

## 2013-05-11 NOTE — Telephone Encounter (Signed)
Rx called to pharmacy as instructed. 

## 2013-05-11 NOTE — Telephone Encounter (Signed)
Received refill request electroncially. Last office visit 08/20/12. Is it okay to refill medication?

## 2013-05-27 ENCOUNTER — Other Ambulatory Visit: Payer: Self-pay

## 2013-07-29 NOTE — Addendum Note (Signed)
Addended by: Lucille Passy on: 07/29/2013 02:11 PM   Modules accepted: Orders

## 2013-08-24 ENCOUNTER — Ambulatory Visit (INDEPENDENT_AMBULATORY_CARE_PROVIDER_SITE_OTHER): Payer: BC Managed Care – PPO | Admitting: Family Medicine

## 2013-08-24 ENCOUNTER — Encounter: Payer: Self-pay | Admitting: Family Medicine

## 2013-08-24 VITALS — BP 112/70 | HR 70 | Temp 97.7°F | Ht 60.25 in | Wt 123.5 lb

## 2013-08-24 DIAGNOSIS — Z1231 Encounter for screening mammogram for malignant neoplasm of breast: Secondary | ICD-10-CM

## 2013-08-24 DIAGNOSIS — Z0000 Encounter for general adult medical examination without abnormal findings: Secondary | ICD-10-CM

## 2013-08-24 DIAGNOSIS — M949 Disorder of cartilage, unspecified: Secondary | ICD-10-CM

## 2013-08-24 DIAGNOSIS — R32 Unspecified urinary incontinence: Secondary | ICD-10-CM

## 2013-08-24 DIAGNOSIS — K5730 Diverticulosis of large intestine without perforation or abscess without bleeding: Secondary | ICD-10-CM

## 2013-08-24 DIAGNOSIS — J309 Allergic rhinitis, unspecified: Secondary | ICD-10-CM

## 2013-08-24 DIAGNOSIS — Z136 Encounter for screening for cardiovascular disorders: Secondary | ICD-10-CM

## 2013-08-24 DIAGNOSIS — F4321 Adjustment disorder with depressed mood: Secondary | ICD-10-CM

## 2013-08-24 DIAGNOSIS — Z79890 Hormone replacement therapy (postmenopausal): Secondary | ICD-10-CM

## 2013-08-24 DIAGNOSIS — M899 Disorder of bone, unspecified: Secondary | ICD-10-CM

## 2013-08-24 DIAGNOSIS — E785 Hyperlipidemia, unspecified: Secondary | ICD-10-CM

## 2013-08-24 DIAGNOSIS — F411 Generalized anxiety disorder: Secondary | ICD-10-CM

## 2013-08-24 LAB — LIPID PANEL
Cholesterol: 256 mg/dL — ABNORMAL HIGH (ref 0–200)
HDL: 97.7 mg/dL (ref >39.00)
Total CHOL/HDL Ratio: 3
Triglycerides: 96 mg/dL (ref 0.0–149.0)
VLDL: 19.2 mg/dL (ref 0.0–40.0)

## 2013-08-24 LAB — COMPREHENSIVE METABOLIC PANEL
ALBUMIN: 4.4 g/dL (ref 3.5–5.2)
ALT: 24 U/L (ref 0–35)
AST: 20 U/L (ref 0–37)
Alkaline Phosphatase: 43 U/L (ref 39–117)
BUN: 13 mg/dL (ref 6–23)
CALCIUM: 9.6 mg/dL (ref 8.4–10.5)
CHLORIDE: 103 meq/L (ref 96–112)
CO2: 26 meq/L (ref 19–32)
CREATININE: 0.6 mg/dL (ref 0.4–1.2)
GFR: 100.51 mL/min (ref >60.00)
Glucose, Bld: 91 mg/dL (ref 70–99)
POTASSIUM: 4 meq/L (ref 3.5–5.1)
SODIUM: 139 meq/L (ref 135–145)
TOTAL PROTEIN: 7.3 g/dL (ref 6.0–8.3)
Total Bilirubin: 1.1 mg/dL (ref 0.3–1.2)

## 2013-08-24 LAB — CBC WITH DIFFERENTIAL/PLATELET
Basophils Absolute: 0 10*3/uL (ref 0.0–0.1)
Basophils Relative: 0.6 % (ref 0.0–3.0)
EOS PCT: 3.7 % (ref 0.0–5.0)
Eosinophils Absolute: 0.2 10*3/uL (ref 0.0–0.7)
HEMATOCRIT: 45.5 % (ref 36.0–46.0)
Hemoglobin: 15.1 g/dL — ABNORMAL HIGH (ref 12.0–15.0)
LYMPHS PCT: 31.8 % (ref 12.0–46.0)
Lymphs Abs: 1.9 10*3/uL (ref 0.7–4.0)
MCHC: 33.2 g/dL (ref 30.0–36.0)
MCV: 100.8 fl — ABNORMAL HIGH (ref 78.0–100.0)
Monocytes Absolute: 0.6 10*3/uL (ref 0.1–1.0)
Monocytes Relative: 9.4 % (ref 3.0–12.0)
NEUTROS PCT: 54.5 % (ref 43.0–77.0)
Neutro Abs: 3.3 10*3/uL (ref 1.4–7.7)
PLATELETS: 248 10*3/uL (ref 150.0–400.0)
RBC: 4.51 Mil/uL (ref 3.87–5.11)
RDW: 12.5 % (ref 11.5–14.6)
WBC: 6 10*3/uL (ref 4.5–10.5)

## 2013-08-24 LAB — TSH: TSH: 1.06 u[IU]/mL (ref 0.35–5.50)

## 2013-08-24 LAB — LDL CHOLESTEROL, DIRECT: LDL DIRECT: 134.6 mg/dL

## 2013-08-24 MED ORDER — CONJ ESTROG-MEDROXYPROGEST ACE 0.3-1.5 MG PO TABS: 1.0000 | ORAL_TABLET | Freq: Every day | ORAL | Status: DC

## 2013-08-24 MED ORDER — ALPRAZOLAM 0.25 MG PO TABS: ORAL_TABLET | ORAL | Status: DC

## 2013-08-24 NOTE — Patient Instructions (Signed)
Great to see you. I will send a note when we get your labs back on mychart.  Please call to set up your mammogram.

## 2013-08-24 NOTE — Assessment & Plan Note (Signed)
No changes- on zocor. Recheck labs today. Orders Placed This Encounter  Procedures  . MM DIGITAL SCREENING BILATERAL  . Comprehensive metabolic panel  . Lipid panel  . CBC with Differential  . TSH  . Vitamin D, 25-hydroxy

## 2013-08-24 NOTE — Assessment & Plan Note (Signed)
Stopped taking flonase and zyrtec bc she has appt coming up with ENT and would like to "start over." She will update me after office visit.

## 2013-08-24 NOTE — Progress Notes (Signed)
Pre-visit discussion using our clinic review tool. No additional management support is needed unless otherwise documented below in the visit note.  

## 2013-08-24 NOTE — Assessment & Plan Note (Signed)
Reviewed preventive care protocols, scheduled due services, and updated immunizations Discussed nutrition, exercise, diet, and healthy lifestyle.  

## 2013-08-24 NOTE — Progress Notes (Signed)
63  yo very pleasant female here for CPX.   Well woman- menopausal for past 5 + years.   No h/o abnormal pap smears.  Denies any post menopausal bleeding. Last pap smear was normal and done by me in 07/2012.  No h/o abnormal pap smears.    [facility].  Due for mammogram.  Colonoscopy UTD- Sees Dr. Carlean Purl.  Recall 08/22/2016.   Unfortunately her sister had a massive stroke with very significant deficits in 05/2013.  She feels she is handling this ok.  Taking very infrequent xanax.  Hyperlipidemia- well controlled on Simvastatin 20 mg daily.  Denies any myalgias.   Due for fasting labs. Lab Results  Component Value Date   CHOL 194 08/20/2012   HDL 85.70 08/20/2012   LDLCALC 89 08/20/2012   LDLDIRECT 128.4 07/02/2011   TRIG 98.0 08/20/2012   CHOLHDL 2 08/20/2012   Lab Results  Component Value Date   ALT 20 08/20/2012   AST 19 08/20/2012   ALKPHOS 34* 08/20/2012   BILITOT 0.9 08/20/2012          . Patient Active Problem List   Diagnosis Date Noted  . Routine general medical examination at a health care facility 07/02/2011  . ANXIETY 08/23/2008  . ALLERGIC RHINITIS 04/21/2008  . DEPRESSION, SITUATIONAL 06/08/2007  . HYPERLIPIDEMIA 04/09/2007  . URINARY INCONTINENCE 04/09/2007  . DIVERTICULOSIS, COLON 08/22/2006  . OSTEOPENIA 02/19/2005   Past Medical History  Diagnosis Date  . Osteopenia 02/19/2005  . Hyperlipidemia   . Urinary incontinence   . Anxiety    Past Surgical History  Procedure Laterality Date  . Colonoscopy  08/2006  . Cesarean section    . Repeat cesarean section    . Bladder tack     History  Substance Use Topics  . Smoking status: Never Smoker   . Smokeless tobacco: Not on file  . Alcohol Use: Yes   Family History  Problem Relation Age of Onset  . Hyperlipidemia Mother   . Stroke Father   . Hypertension Sister   . Depression Sister   . Alcohol abuse Sister   . Hyperlipidemia Brother   . Cancer  Maternal Grandmother     breast  . Diabetes Paternal Grandfather   . Diabetes Brother   . Hypertension Brother   . Depression Brother   . Hyperlipidemia Brother   . Obesity Sister    No Known Allergies Current Outpatient Prescriptions on File Prior to Visit  Medication Sig Dispense Refill  . ALPRAZolam (XANAX) 0.25 MG tablet TAKE 1 TO 2 TABLETS BY MOUTH EVERY 6 HOURS AS NEEDED ANXIETY  30 tablet  0  . Biotin 1000 MCG tablet Take 1 tablet (1 mg total) by mouth daily.  180 tablet  1  . Calcium Carbonate-Vitamin D (CALCIUM 600/VITAMIN D) 600-400 MG-UNIT per tablet Take 1 tablet by mouth 2 (two) times daily.  180 tablet  3  . cetirizine (ZYRTEC) 10 MG tablet Take 1 tablet (10 mg total) by mouth daily.  30 tablet  11  . Cholecalciferol ([facility] VITAMIN D3) 1000 UNITS capsule TAKE 1 TABLET BY MOUTH 2 (TWO) TIMES DAILY.  180 capsule  1  . estradiol-norethindrone (COMBIPATCH) 0.05-0.14 MG/DAY Place 1 patch onto the skin 2 (two) times a week.  8 patch  12  . estradiol-norethindrone (COMBIPATCH) 0.05-0.14 MG/DAY Place 1 patch onto the skin 2 (two) times a week.  24 patch  3  . estrogen, conjugated,-medroxyprogesterone (PREMPRO) 0.3-1.5 MG per  tablet Take 1 tablet by mouth daily.  30 tablet  11  . fish oil-omega-3 fatty acids 1000 MG capsule Take 2 g by mouth daily.      Marland Kitchen NEXIUM 40 MG capsule TAKE ONE CAPSULE BY MOUTH EVERY DAY  90 capsule  1  . simvastatin (ZOCOR) 20 MG tablet TAKE 1 TABLET BY MOUTH DAILY.  90 tablet  1  . Tretinoin, Facial Wrinkles, 0.02 % CREA Apply topically qhs  1 Tube  0   No current facility-administered medications on file prior to visit.   The PMH, PSH, Social History, Family History, Medications, and allergies have been reviewed in [facility], and have been updated if relevant.  ROS: See HPI Patient reports no  vision/ hearing changes,anorexia, weight change, fever ,adenopathy, persistant / recurrent hoarseness, swallowing issues, chest pain, edema,persistant / recurrent cough,  hemoptysis, dyspnea(rest, exertional, paroxysmal nocturnal), gastrointestinal  bleeding (melena, rectal bleeding), abdominal pain, excessive heart burn, GU symptoms(dysuria, hematuria, pyuria, voiding/incontinence  Issues) syncope, focal weakness, severe memory loss, concerning skin lesions, depression, anxiety, abnormal bruising/bleeding, major joint swelling, breast masses or abnormal vaginal bleeding.    Physical exam: BP 112/70  Pulse 70  Temp(Src) 97.7 F (36.5 C) (Oral)  Ht 5' 0.25" (1.53 m)  Wt 123 lb 8 oz (56.019 kg)  BMI 23.93 kg/m2  SpO2 97%   General:  Well-developed,well-nourished,in no acute distress; alert,appropriate and cooperative throughout examination Head:  normocephalic and atraumatic.   Eyes:  vision grossly intact, pupils equal, pupils round, and pupils reactive to light.   Ears:  R ear normal and L ear normal.   Nose:  no external deformity.   Mouth:  good dentition.   Neck:  No deformities, masses, or tenderness noted. Breasts:  No mass, nodules, thickening, tenderness, bulging, retraction, inflamation, nipple discharge or skin changes noted.   Lungs:  Normal respiratory effort, chest expands symmetrically. Lungs are clear to auscultation, no crackles or wheezes. Heart:  Normal rate and regular rhythm. S1 and S2 normal without gallop, murmur, click, rub or other extra sounds. Abdomen:  Bowel sounds positive,abdomen soft and non-tender without masses, organomegaly or hernias noted. Msk:  No deformity or scoliosis noted of thoracic or lumbar spine.   Extremities:  No clubbing, cyanosis, edema, or deformity noted with normal full range of motion of all joints.   Neurologic:  alert & oriented X3 and gait normal.   Skin:  Intact without suspicious lesions or rashes Cervical Nodes:  No lymphadenopathy noted Axillary Nodes:  No palpable lymphadenopathy Psych:  Cognition and judgment appear intact. Alert and cooperative with normal attention span and concentration. No  apparent delusions, illusions, hallucinations   Assessment and Plan:

## 2013-08-24 NOTE — Assessment & Plan Note (Signed)
Refilled prempro. UTD on prevention.  She will attempt to wean in near future.

## 2013-08-24 NOTE — Assessment & Plan Note (Signed)
Well controlled. No changes Rx handed to pt- xanax.

## 2013-08-25 ENCOUNTER — Encounter: Payer: Self-pay | Admitting: Family Medicine

## 2013-08-25 LAB — VITAMIN D 25 HYDROXY (VIT D DEFICIENCY, FRACTURES): VIT D 25 HYDROXY: 41 ng/mL (ref 30–89)

## 2013-09-21 ENCOUNTER — Encounter: Payer: Self-pay | Admitting: Family Medicine

## 2013-09-22 ENCOUNTER — Encounter: Payer: Self-pay | Admitting: Family Medicine

## 2013-11-29 ENCOUNTER — Encounter: Payer: Self-pay | Admitting: Family Medicine

## 2013-11-29 NOTE — Telephone Encounter (Signed)
Pt left v/m that pt is returning a call from Dr Elonda Husky' office.Please advise.

## 2013-11-30 NOTE — Telephone Encounter (Signed)
Spoke to pt who states that she has spoken with her insurance company and they will cover the original Rx that she received. She states that she has picked up and applied.

## 2013-11-30 NOTE — Telephone Encounter (Signed)
Does she want a patch?

## 2013-12-20 ENCOUNTER — Other Ambulatory Visit: Payer: Self-pay | Admitting: Family Medicine

## 2013-12-27 ENCOUNTER — Encounter: Payer: Self-pay | Admitting: Family Medicine

## 2014-01-12 ENCOUNTER — Other Ambulatory Visit: Payer: Self-pay | Admitting: Family Medicine

## 2014-01-24 ENCOUNTER — Other Ambulatory Visit: Payer: Self-pay | Admitting: Family Medicine

## 2014-01-24 NOTE — Telephone Encounter (Signed)
Spoke to pt and informed her Rx has been faxed to requested pharmacy 

## 2014-01-24 NOTE — Telephone Encounter (Signed)
Pt requesting medication refill. Last f/u ov 08/2013 with f/u appt 01/2014. pls advise

## 2014-02-03 ENCOUNTER — Other Ambulatory Visit: Payer: Self-pay

## 2014-02-03 ENCOUNTER — Encounter: Payer: Self-pay | Admitting: Family Medicine

## 2014-02-03 ENCOUNTER — Ambulatory Visit (INDEPENDENT_AMBULATORY_CARE_PROVIDER_SITE_OTHER): Payer: BC Managed Care – PPO | Admitting: Family Medicine

## 2014-02-03 VITALS — BP 118/72 | HR 81 | Temp 97.5°F | Wt 123.5 lb

## 2014-02-03 DIAGNOSIS — Z823 Family history of stroke: Secondary | ICD-10-CM

## 2014-02-03 DIAGNOSIS — Z79890 Hormone replacement therapy (postmenopausal): Secondary | ICD-10-CM

## 2014-02-03 DIAGNOSIS — F4323 Adjustment disorder with mixed anxiety and depressed mood: Secondary | ICD-10-CM

## 2014-02-03 DIAGNOSIS — Z8249 Family history of ischemic heart disease and other diseases of the circulatory system: Secondary | ICD-10-CM | POA: Insufficient documentation

## 2014-02-03 DIAGNOSIS — E785 Hyperlipidemia, unspecified: Secondary | ICD-10-CM

## 2014-02-03 MED ORDER — CITALOPRAM HYDROBROMIDE 10 MG PO TABS: ORAL_TABLET | ORAL | Status: DC

## 2014-02-03 MED ORDER — ROSUVASTATIN CALCIUM 10 MG PO TABS: 10.0000 mg | ORAL_TABLET | Freq: Every day | ORAL | Status: DC

## 2014-02-03 NOTE — Assessment & Plan Note (Signed)
Discussed tx options. At this point, she would like to restart SSRI- eRx sent for citalopram. Follow up in 3 weeks by phone.

## 2014-02-03 NOTE — Assessment & Plan Note (Signed)
Carotid dopplers ordered. 

## 2014-02-03 NOTE — Patient Instructions (Addendum)
Good to see you. We are stopping zocor. Start taking crestor 10 mg daily. Follow up labs in 4 weeks.  We are starting celexa 20 mg daily.  Call me in a few weeks with an update.  Stop by to see Rosaria Ferries on your way out.  Hang in there.

## 2014-02-03 NOTE — Assessment & Plan Note (Signed)
Discuss wean off HRT to decrease stroke risk. She is willing to try this and will update me with her symptoms.

## 2014-02-03 NOTE — Telephone Encounter (Signed)
Pt called back and [facility] did not have celexa rx. Spoke with [facility] CMA and [facility]lexa was not called to [facility] as indicated from med list; pt requested celexa sent to [place]. Advised pt done.

## 2014-02-03 NOTE — Progress Notes (Signed)
Subjective:   Patient ID: Patricia Hampton, female    DOB: 10-Oct-1950, 63 y.o.   MRN: [id number]  Patricia Hampton is a pleasant 63 y.o. year old female who presents to clinic today with Follow-up  on 02/03/2014  HPI: Here to discuss concerns.  We asked her to make this appointment after we received this my chart message from her:  I have a concern about my heart health .  My mother died at 61 with a massive heart attack, my father had multiple Heart surgeries and finally had a stroke at 10 that left him paralyzed on the left side until he died at the age of 19 . My brother began having heart problems at age 12 . he is had several heart attacks , bypass surgeries, multiple stints , carotid artery surgery , pacemaker and now has A defibrillator installed. In November last year my 4 year old sister had a massive stroke that has left her completely bed ridden . Her mind is fine ... She in non ambulatory, non-verbal, can not eat or drink.... Has feeding tube, she requires 100% care 24/7 at home. She had a few mini strokes prior to this and also carotid artery surgery and one side. 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  Is there a test that I can have done to prevent a stroke or heart attack? Can I have my carotid arteries checked for blockage before something happens to me ?   She is a non smoker. She does take a statin, Zocor 20 mg daily. Lab Results  Component Value Date   CHOL 256* 08/24/2013   HDL 97.70 08/24/2013   LDLCALC 89 08/20/2012   LDLDIRECT 134.6 08/24/2013   TRIG 96.0 08/24/2013   CHOLHDL 3 08/24/2013   She does take Prempro, HRT for post menopausal symptoms.  She is also more tearful and anxious lately.  Having to take more xanax. Having a tough time dealing with her sibling being so ill. Very supportive husband.  Sleeping ok. No SI or HI.  Current Outpatient Prescriptions on File Prior to Visit  Medication Sig Dispense Refill  . ALPRAZolam (XANAX)  0.25 MG tablet TAKE 1 TO 2 TABLETS BY MOUTH EVERY 6 HOURS AS NEEDED FOR ANXIETY  30 tablet  0  . Biotin 1000 MCG tablet Take 1 tablet (1 mg total) by mouth daily.  180 tablet  1  . Calcium Carbonate-Vitamin D (CALCIUM 600/VITAMIN D) 600-400 MG-UNIT per tablet Take 1 tablet by mouth 2 (two) times daily.  180 tablet  3  . Cholecalciferol ([facility] VITAMIN D3) 1000 UNITS capsule TAKE 1 TABLET BY MOUTH 2 (TWO) TIMES DAILY.  180 capsule  1  . COMBIPATCH 0.05-0.14 MG/DAY PLACE 1 PATCH ONTO THE SKIN TWO TIMES A WEEK  8 patch  6  . estrogen, conjugated,-medroxyprogesterone (PREMPRO) 0.3-1.5 MG per tablet Take 1 tablet by mouth daily.  30 tablet  11  . fish oil-omega-3 fatty acids 1000 MG capsule Take 2 g by mouth daily.      . Tretinoin, Facial Wrinkles, 0.02 % CREA Apply topically qhs  1 Tube  0   No current facility-administered medications on file prior to visit.    No Known Allergies  Past Medical History  Diagnosis Date  . Osteopenia 02/19/2005  . Hyperlipidemia   . Urinary incontinence   . Anxiety     Past Surgical History  Procedure Laterality Date  . Colonoscopy  08/2006  .  Cesarean section    . Repeat cesarean section    . Bladder tack      Family History  Problem Relation Age of Onset  . Hyperlipidemia Mother   . Stroke Father   . Hypertension Sister   . Depression Sister   . Alcohol abuse Sister   . Stroke Sister   . Hyperlipidemia Brother   . Cancer Maternal Grandmother     breast  . Diabetes Paternal Grandfather   . Diabetes Brother   . Hypertension Brother   . Depression Brother   . Hyperlipidemia Brother   . Obesity Sister     History   Social History  . Marital Status: Married    Spouse Name: N/A    Number of Children: 2  . Years of Education: N/A   Occupational History  .  [place] History Main Topics  . Smoking status: Never Smoker   . Smokeless tobacco: Not on file  . Alcohol Use: Yes  . Drug Use: No  . Sexual Activity:  Not on file   Other Topics Concern  . Not on file   Social History Narrative  . No narrative on file   The PMH, PSH, Social History, Family History, Medications, and allergies have been reviewed in [facility], and have been updated if relevant.     Review of Systems    See HPI No CP No dizziness No SOB No blurred vision Objective:    BP 118/72  Pulse 81  Temp(Src) 97.5 F (36.4 C) (Oral)  Wt 123 lb 8 oz (56.019 kg)  SpO2 97%   Physical Exam Gen:  Alert, pleasant, NAD HEENT:  No carotid bruits CVS:  RRR Psych:  Tearful, good eye contact, appropriate       Assessment & Plan:   Family history of carotid artery stenosis - Plan: Ultrasound doppler carotid  Family history of stroke - Plan: Ultrasound doppler carotid, CANCELED: Carotid duplex  FH: stroke  HYPERLIPIDEMIA  Post-menopause on HRT (hormone replacement therapy)  Adjustment disorder with mixed anxiety and depressed mood Return in about 4 weeks (around 03/03/2014) for follow up labs.

## 2014-02-03 NOTE — Assessment & Plan Note (Signed)
LDL under reasonable control with zocor but she would like to decrease her LDL more without increasing zocor as she has had some muscle aches. We agreed to d/c zocor. Start crestor 10 mg daily. Follow up labs in 4 weeks. The patient indicates understanding of these issues and agrees with the plan.

## 2014-02-28 ENCOUNTER — Other Ambulatory Visit: Payer: Self-pay | Admitting: Family Medicine

## 2014-02-28 DIAGNOSIS — E785 Hyperlipidemia, unspecified: Secondary | ICD-10-CM

## 2014-03-03 ENCOUNTER — Other Ambulatory Visit (INDEPENDENT_AMBULATORY_CARE_PROVIDER_SITE_OTHER): Payer: BC Managed Care – PPO

## 2014-03-03 ENCOUNTER — Encounter: Payer: Self-pay | Admitting: Family Medicine

## 2014-03-03 ENCOUNTER — Encounter: Payer: Self-pay | Admitting: *Deleted

## 2014-03-03 DIAGNOSIS — R5381 Other malaise: Secondary | ICD-10-CM

## 2014-03-03 DIAGNOSIS — E785 Hyperlipidemia, unspecified: Secondary | ICD-10-CM

## 2014-03-03 DIAGNOSIS — R5383 Other fatigue: Secondary | ICD-10-CM

## 2014-03-03 LAB — COMPREHENSIVE METABOLIC PANEL
ALBUMIN: 4.5 g/dL (ref 3.5–5.2)
ALK PHOS: 41 U/L (ref 39–117)
ALT: 29 U/L (ref 0–35)
AST: 25 U/L (ref 0–37)
BUN: 14 mg/dL (ref 6–23)
CO2: 28 mEq/L (ref 19–32)
Calcium: 9.5 mg/dL (ref 8.4–10.5)
Chloride: 100 mEq/L (ref 96–112)
Creatinine, Ser: 0.7 mg/dL (ref 0.4–1.2)
GFR: 95.16 mL/min (ref >60.00)
GLUCOSE: 93 mg/dL (ref 70–99)
POTASSIUM: 4.3 meq/L (ref 3.5–5.1)
SODIUM: 137 meq/L (ref 135–145)
TOTAL PROTEIN: 7.1 g/dL (ref 6.0–8.3)
Total Bilirubin: 1 mg/dL (ref 0.2–1.2)

## 2014-03-03 LAB — LIPID PANEL
CHOL/HDL RATIO: 2
CHOLESTEROL: 211 mg/dL — AB (ref 0–200)
HDL: 93.5 mg/dL (ref >39.00)
LDL CALC: 102 mg/dL — AB (ref 0–99)
NonHDL: 117.5
Triglycerides: 76 mg/dL (ref 0.0–149.0)
VLDL: 15.2 mg/dL (ref 0.0–40.0)

## 2014-03-03 LAB — CBC WITH DIFFERENTIAL/PLATELET
BASOS ABS: 0 10*3/uL (ref 0.0–0.1)
Basophils Relative: 0.5 % (ref 0.0–3.0)
EOS ABS: 0.3 10*3/uL (ref 0.0–0.7)
Eosinophils Relative: 5.1 % — ABNORMAL HIGH (ref 0.0–5.0)
HCT: 44.8 % (ref 36.0–46.0)
HEMOGLOBIN: 15.1 g/dL — AB (ref 12.0–15.0)
LYMPHS ABS: 2 10*3/uL (ref 0.7–4.0)
Lymphocytes Relative: 32.8 % (ref 12.0–46.0)
MCHC: 33.8 g/dL (ref 30.0–36.0)
MCV: 98.7 fl (ref 78.0–100.0)
MONO ABS: 0.6 10*3/uL (ref 0.1–1.0)
Monocytes Relative: 9.8 % (ref 3.0–12.0)
Neutro Abs: 3.2 10*3/uL (ref 1.4–7.7)
Neutrophils Relative %: 51.8 % (ref 43.0–77.0)
PLATELETS: 247 10*3/uL (ref 150.0–400.0)
RBC: 4.54 Mil/uL (ref 3.87–5.11)
RDW: 11.9 % (ref 11.5–15.5)
WBC: 6.1 10*3/uL (ref 4.0–10.5)

## 2014-03-03 LAB — VITAMIN B12: VITAMIN B 12: 835 pg/mL (ref 211–911)

## 2014-03-03 LAB — TSH: TSH: 0.78 u[IU]/mL (ref 0.35–4.50)

## 2014-03-03 LAB — VITAMIN D 25 HYDROXY (VIT D DEFICIENCY, FRACTURES): VITD: 44.24 ng/mL (ref 30.00–100.00)

## 2014-03-03 NOTE — Addendum Note (Signed)
Addended by: Ellamae Sia on: 03/03/2014 09:36 AM   Modules accepted: Orders

## 2014-03-09 ENCOUNTER — Other Ambulatory Visit: Payer: Self-pay | Admitting: Family Medicine

## 2014-03-22 ENCOUNTER — Encounter: Payer: Self-pay | Admitting: Family Medicine

## 2014-04-04 ENCOUNTER — Other Ambulatory Visit: Payer: Self-pay | Admitting: Family Medicine

## 2014-04-05 MED ORDER — ALPRAZOLAM 0.25 MG PO TABS: ORAL_TABLET | ORAL | Status: DC

## 2014-04-05 MED ORDER — CITALOPRAM HYDROBROMIDE 10 MG PO TABS: ORAL_TABLET | ORAL | Status: DC

## 2014-04-05 NOTE — Telephone Encounter (Signed)
Pt requesting medication refill. Last f/u appt 01/2014. Ok to fill per Dr Deborra Medina; will be faxed to requested pharmacy before end of day

## 2014-05-17 ENCOUNTER — Encounter: Payer: Self-pay | Admitting: Family Medicine

## 2014-05-17 NOTE — Telephone Encounter (Signed)
Will she need an Ov to discuss weaning options?

## 2014-06-06 ENCOUNTER — Other Ambulatory Visit: Payer: Self-pay | Admitting: *Deleted

## 2014-06-06 MED ORDER — CITALOPRAM HYDROBROMIDE 10 MG PO TABS: ORAL_TABLET | ORAL | Status: DC

## 2014-06-09 ENCOUNTER — Encounter: Payer: Self-pay | Admitting: Family Medicine

## 2014-06-20 ENCOUNTER — Ambulatory Visit: Payer: Self-pay | Admitting: Family Medicine

## 2014-06-20 ENCOUNTER — Encounter: Payer: Self-pay | Admitting: Family Medicine

## 2014-06-22 ENCOUNTER — Encounter: Payer: Self-pay | Admitting: Family Medicine

## 2014-06-22 ENCOUNTER — Ambulatory Visit (INDEPENDENT_AMBULATORY_CARE_PROVIDER_SITE_OTHER): Payer: BC Managed Care – PPO | Admitting: Family Medicine

## 2014-06-22 VITALS — BP 118/76 | HR 91 | Temp 97.7°F | Ht 60.5 in | Wt 127.8 lb

## 2014-06-22 DIAGNOSIS — R635 Abnormal weight gain: Secondary | ICD-10-CM | POA: Insufficient documentation

## 2014-06-22 NOTE — Assessment & Plan Note (Signed)
>  25 minutes spent in face to face time with patient, >50% spent in counselling or coordination of care Explained to pt that "diet pills" would not be appropriate for her for several reasons.  First of all, her BMI is normal and therefore they are not indicated.  Also, sister died of massive CVA and stimulants increase risk of stroke.  She is already taking hormonal therapy which already increases this risk.  Advised keeping a journal of caloric intake and increasing physical activity.  The patient indicates understanding of these issues and agrees with the plan.

## 2014-06-22 NOTE — Patient Instructions (Signed)
Great to see you. I am so sorry for your losses.  Try your fit bit calorie tracker or My Fitness Pal.

## 2014-06-22 NOTE — Progress Notes (Signed)
Subjective:   Patient ID: Patricia Hampton, female    DOB: [birth year], 63 y.o.   MRN: [id number]  Patricia Hampton is a pleasant 63 y.o. year old female who presents to clinic today with Weight Gain  on 06/22/2014  HPI: Weight gain- BMI normal but she feels she is the heaviest she has been.  Trying to walk more.  Both her brother and sister died this year-admits to not eating as well. She is interested in diet pills.  Current Outpatient Prescriptions on File Prior to Visit  Medication Sig Dispense Refill  . ALPRAZolam (XANAX) 0.25 MG tablet TAKE 1 TO 2 TABLETS BY MOUTH EVERY 6 HOURS AS NEEDED FOR ANXIETY 30 tablet 0  . Biotin 1000 MCG tablet Take 1 tablet (1 mg total) by mouth daily. 180 tablet 1  . Calcium Carbonate-Vitamin D (CALCIUM 600/VITAMIN D) 600-400 MG-UNIT per tablet Take 1 tablet by mouth 2 (two) times daily. 180 tablet 3  . Cholecalciferol ([facility] VITAMIN D3) 1000 UNITS capsule TAKE 1 TABLET BY MOUTH 2 (TWO) TIMES DAILY. 180 capsule 1  . COMBIPATCH 0.05-0.14 MG/DAY PLACE 1 PATCH ONTO THE SKIN TWO TIMES A WEEK (Patient taking differently: PLACE 1 PATCH ONTO THE SKIN once A WEEK) 8 patch 6  . fish oil-omega-3 fatty acids 1000 MG capsule Take 2 g by mouth daily.    . rosuvastatin (CRESTOR) 10 MG tablet Take 1 tablet (10 mg total) by mouth daily. 90 tablet 3  . Tretinoin, Facial Wrinkles, 0.02 % CREA Apply topically qhs 1 Tube 0   No current facility-administered medications on file prior to visit.    No Known Allergies  Past Medical History  Diagnosis Date  . Osteopenia 02/19/2005  . Hyperlipidemia   . Urinary incontinence   . Anxiety     Past Surgical History  Procedure Laterality Date  . Colonoscopy  08/2006  . Cesarean section    . Repeat cesarean section    . Bladder tack      Family History  Problem Relation Age of Onset  . Hyperlipidemia Mother   . Stroke Father   . Hypertension Sister   . Depression Sister   . Alcohol abuse Sister   . Stroke  Sister   . Hyperlipidemia Brother   . Cancer Maternal Grandmother     breast  . Diabetes Paternal Grandfather   . Diabetes Brother   . Hypertension Brother   . Depression Brother   . Hyperlipidemia Brother   . Obesity Sister     History   Social History  . Marital Status: Married    Spouse Name: N/A    Number of Children: 2  . Years of Education: N/A   Occupational History  .  [place] History Main Topics  . Smoking status: Never Smoker   . Smokeless tobacco: Not on file  . Alcohol Use: Yes  . Drug Use: No  . Sexual Activity: Not on file   Other Topics Concern  . Not on file   Social History Narrative   The PMH, PSH, Social History, Family History, Medications, and allergies have been reviewed in [facility], and have been updated if relevant.   Review of Systems See HPI    Objective:    BP 118/76 mmHg  Pulse 91  Temp(Src) 97.7 F (36.5 C) (Oral)  Ht 5' 0.5" (1.537 m)  Wt 127 lb 12 oz (57.947 kg)  BMI 24.53 kg/m2  SpO2 96% Wt Readings from Last 3  Encounters:  06/22/14 127 lb 12 oz (57.947 kg)  02/03/14 123 lb 8 oz (56.019 kg)  08/24/13 123 lb 8 oz (56.019 kg)     Physical Exam  Constitutional: She is oriented to person, place, and time. She appears well-developed and well-nourished. No distress.  HENT:  Head: Normocephalic.  Cardiovascular: Normal rate.   Neurological: She is alert and oriented to person, place, and time.  Skin: Skin is warm and dry.  Psychiatric: She has a normal mood and affect. Her behavior is normal. Judgment and thought content normal.  Nursing note and vitals reviewed.         Assessment & Plan:   No diagnosis found. No Follow-up on file.

## 2014-07-04 ENCOUNTER — Encounter: Payer: Self-pay | Admitting: Family Medicine

## 2014-07-08 ENCOUNTER — Ambulatory Visit: Payer: Self-pay | Admitting: Ophthalmology

## 2014-07-12 ENCOUNTER — Encounter: Payer: Self-pay | Admitting: Family Medicine

## 2014-07-27 ENCOUNTER — Encounter: Payer: Self-pay | Admitting: Family Medicine

## 2014-07-27 ENCOUNTER — Ambulatory Visit (INDEPENDENT_AMBULATORY_CARE_PROVIDER_SITE_OTHER): Payer: BC Managed Care – PPO | Admitting: Family Medicine

## 2014-07-27 VITALS — BP 116/76 | HR 67 | Temp 98.3°F | Wt 127.2 lb

## 2014-07-27 DIAGNOSIS — F4323 Adjustment disorder with mixed anxiety and depressed mood: Secondary | ICD-10-CM

## 2014-07-27 DIAGNOSIS — F411 Generalized anxiety disorder: Secondary | ICD-10-CM

## 2014-07-27 MED ORDER — BUPROPION HCL ER (XL) 150 MG PO TB24: 150.0000 mg | ORAL_TABLET | Freq: Every day | ORAL | Status: DC

## 2014-07-27 NOTE — Progress Notes (Signed)
Pre visit review using our clinic review tool, if applicable. No additional management support is needed unless otherwise documented below in the visit note. 

## 2014-07-27 NOTE — Patient Instructions (Signed)
Great to see you. We are starting Wellbutrin 150 mg daily every morning.  Ok to stop citalopram- 1 tablet every other for a week to wean.

## 2014-07-27 NOTE — Progress Notes (Signed)
Subjective:   Patient ID: [name], female    DOB: 1950-10-27, 64 y.o.   MRN: [id number]  Patricia Hampton is a pleasant 64 y.o. year old female who presents to clinic today with Follow-up  on 07/27/2014  HPI:  Depression- had a very difficult year last year.  Both her brother and sister died. She thought she was handling it as best she could but has been feeling more sad and less interested in doing things she likes to do.  Denies anxiety, panic attacks, SI or HI. Sleeping ok- decided to restart celexa 10 mg daily.  This has improved her symptoms but she is interested in discussing other options as she is concerned that citalopram has been causing weight gain.  Current Outpatient Prescriptions on File Prior to Visit  Medication Sig Dispense Refill  . ALPRAZolam (XANAX) 0.25 MG tablet TAKE 1 TO 2 TABLETS BY MOUTH EVERY 6 HOURS AS NEEDED FOR ANXIETY 30 tablet 0  . Biotin 1000 MCG tablet Take 1 tablet (1 mg total) by mouth daily. 180 tablet 1  . Calcium Carbonate-Vitamin D (CALCIUM 600/VITAMIN D) 600-400 MG-UNIT per tablet Take 1 tablet by mouth 2 (two) times daily. 180 tablet 3  . Cholecalciferol ([facility] VITAMIN D3) 1000 UNITS capsule TAKE 1 TABLET BY MOUTH 2 (TWO) TIMES DAILY. 180 capsule 1  . COMBIPATCH 0.05-0.14 MG/DAY PLACE 1 PATCH ONTO THE SKIN TWO TIMES A WEEK (Patient taking differently: PLACE 1 PATCH ONTO THE SKIN once A WEEK) 8 patch 6  . fish oil-omega-3 fatty acids 1000 MG capsule Take 2 g by mouth daily.    . rosuvastatin (CRESTOR) 10 MG tablet Take 1 tablet (10 mg total) by mouth daily. 90 tablet 3  . Tretinoin, Facial Wrinkles, 0.02 % CREA Apply topically qhs 1 Tube 0   No current facility-administered medications on file prior to visit.    No Known Allergies  Past Medical History  Diagnosis Date  . Osteopenia 02/19/2005  . Hyperlipidemia   . Urinary incontinence   . Anxiety     Past Surgical History  Procedure Laterality Date  . Colonoscopy  08/2006    . Cesarean section    . Repeat cesarean section    . Bladder tack      Family History  Problem Relation Age of Onset  . Hyperlipidemia Mother   . Stroke Father   . Hypertension Sister   . Depression Sister   . Alcohol abuse Sister   . Stroke Sister   . Hyperlipidemia Brother   . Cancer Maternal Grandmother     breast  . Diabetes Paternal Grandfather   . Diabetes Brother   . Hypertension Brother   . Depression Brother   . Hyperlipidemia Brother   . Obesity Sister     History   Social History  . Marital Status: Married    Spouse Name: N/A    Number of Children: 2  . Years of Education: N/A   Occupational History  .  [place] History Main Topics  . Smoking status: Never Smoker   . Smokeless tobacco: Not on file  . Alcohol Use: Yes  . Drug Use: No  . Sexual Activity: Not on file   Other Topics Concern  . Not on file   Social History Narrative   The PMH, PSH, Social History, Family History, Medications, and allergies have been reviewed in [facility], and have been updated if relevant.   Review of Systems  Constitutional: Negative.  HENT: Negative.   Respiratory: Negative.   Cardiovascular: Negative.   Gastrointestinal: Negative.   Genitourinary: Negative.   Musculoskeletal: Negative.   Skin: Negative.   Neurological: Negative.   Psychiatric/Behavioral: Positive for dysphoric mood and decreased concentration. Negative for suicidal ideas, behavioral problems, confusion, sleep disturbance, self-injury and agitation. The patient is not nervous/anxious and is not hyperactive.   All other systems reviewed and are negative.      Objective:    BP 116/76 mmHg  Pulse 67  Temp(Src) 98.3 F (36.8 C) (Oral)  Wt 127 lb 4 oz (57.72 kg)  SpO2 97%  Wt Readings from Last 3 Encounters:  07/27/14 127 lb 4 oz (57.72 kg)  06/22/14 127 lb 12 oz (57.947 kg)  02/03/14 123 lb 8 oz (56.019 kg)    Physical Exam  Constitutional: She is oriented to  person, place, and time. She appears well-developed and well-nourished. No distress.  HENT:  Head: Normocephalic.  Eyes: Conjunctivae are normal.  Neck: Normal range of motion.  Cardiovascular: Normal rate.   Pulmonary/Chest: Effort normal.  Musculoskeletal: Normal range of motion.  Neurological: She is alert and oriented to person, place, and time. No cranial nerve deficit.  Skin: Skin is warm and dry.  Psychiatric: She has a normal mood and affect. Her behavior is normal. Judgment and thought content normal.  Nursing note and vitals reviewed.         Assessment & Plan:   Anxiety state No Follow-up on file.

## 2014-07-27 NOTE — Assessment & Plan Note (Signed)
>  25 minutes spent in face to face time with patient, >50% spent in counselling or coordination of care Deteriorated which is not unexpected given recent life events. Offered my support again. Discussed rx options- she is interested in trial of Wellbutrin. Wean off celexa, start wellbutrin 150 mg XL daily. She will update me in 3 weeks. The patient indicates understanding of these issues and agrees with the plan.

## 2014-08-15 ENCOUNTER — Other Ambulatory Visit: Payer: Self-pay | Admitting: Family Medicine

## 2014-08-15 NOTE — Telephone Encounter (Signed)
Received refill request electronically from pharmacy. Last office visit 07/27/14. Medication is not on medication list. Is it okay to refill medication?

## 2014-08-23 ENCOUNTER — Other Ambulatory Visit: Payer: Self-pay | Admitting: Family Medicine

## 2014-08-23 NOTE — Telephone Encounter (Signed)
Rx called in to requested pharmacy 

## 2014-08-23 NOTE — Telephone Encounter (Signed)
Last f/u appt 07/2014 

## 2014-09-06 ENCOUNTER — Encounter: Payer: Self-pay | Admitting: Family Medicine

## 2014-09-26 ENCOUNTER — Other Ambulatory Visit: Payer: Self-pay | Admitting: Family Medicine

## 2014-10-23 ENCOUNTER — Other Ambulatory Visit: Payer: Self-pay | Admitting: Family Medicine

## 2014-12-20 ENCOUNTER — Other Ambulatory Visit: Payer: Self-pay | Admitting: Family Medicine

## 2014-12-22 ENCOUNTER — Other Ambulatory Visit: Payer: Self-pay | Admitting: Family Medicine

## 2014-12-22 NOTE — Telephone Encounter (Signed)
Last f/u appt 07/2014 

## 2014-12-22 NOTE — Telephone Encounter (Signed)
Rx called in to requested pharmacy 

## 2015-02-21 ENCOUNTER — Encounter: Payer: Self-pay | Admitting: Family Medicine

## 2015-02-22 ENCOUNTER — Other Ambulatory Visit (HOSPITAL_COMMUNITY)
Admission: RE | Admit: 2015-02-22 | Discharge: 2015-02-22 | Disposition: A | Discharge status: Home / Self Care | Payer: BC Managed Care – PPO | Source: Ambulatory Visit | Attending: Family Medicine | Admitting: Family Medicine

## 2015-02-22 ENCOUNTER — Ambulatory Visit (INDEPENDENT_AMBULATORY_CARE_PROVIDER_SITE_OTHER): Payer: BC Managed Care – PPO | Admitting: Family Medicine

## 2015-02-22 ENCOUNTER — Encounter: Payer: Self-pay | Admitting: Family Medicine

## 2015-02-22 VITALS — BP 118/72 | HR 68 | Temp 97.7°F | Ht 60.5 in | Wt 126.5 lb

## 2015-02-22 DIAGNOSIS — Z114 Encounter for screening for human immunodeficiency virus [HIV]: Secondary | ICD-10-CM | POA: Diagnosis not present

## 2015-02-22 DIAGNOSIS — Z79890 Hormone replacement therapy (postmenopausal): Secondary | ICD-10-CM

## 2015-02-22 DIAGNOSIS — E785 Hyperlipidemia, unspecified: Secondary | ICD-10-CM

## 2015-02-22 DIAGNOSIS — Z01419 Encounter for gynecological examination (general) (routine) without abnormal findings: Secondary | ICD-10-CM | POA: Diagnosis present

## 2015-02-22 DIAGNOSIS — Z0000 Encounter for general adult medical examination without abnormal findings: Secondary | ICD-10-CM | POA: Diagnosis not present

## 2015-02-22 DIAGNOSIS — H9313 Tinnitus, bilateral: Secondary | ICD-10-CM | POA: Insufficient documentation

## 2015-02-22 DIAGNOSIS — Z1151 Encounter for screening for human papillomavirus (HPV): Secondary | ICD-10-CM | POA: Insufficient documentation

## 2015-02-22 DIAGNOSIS — F4323 Adjustment disorder with mixed anxiety and depressed mood: Secondary | ICD-10-CM

## 2015-02-22 DIAGNOSIS — K5730 Diverticulosis of large intestine without perforation or abscess without bleeding: Secondary | ICD-10-CM

## 2015-02-22 LAB — COMPREHENSIVE METABOLIC PANEL
ALT: 22 U/L (ref 0–35)
AST: 21 U/L (ref 0–37)
Albumin: 4.4 g/dL (ref 3.5–5.2)
Alkaline Phosphatase: 41 U/L (ref 39–117)
BILIRUBIN TOTAL: 0.7 mg/dL (ref 0.2–1.2)
BUN: 16 mg/dL (ref 6–23)
CALCIUM: 9.1 mg/dL (ref 8.4–10.5)
CO2: 28 mEq/L (ref 19–32)
CREATININE: 0.65 mg/dL (ref 0.40–1.20)
Chloride: 104 mEq/L (ref 96–112)
GFR: 98.23 mL/min (ref >60.00)
Glucose, Bld: 102 mg/dL — ABNORMAL HIGH (ref 70–99)
Potassium: 3.9 mEq/L (ref 3.5–5.1)
SODIUM: 139 meq/L (ref 135–145)
TOTAL PROTEIN: 6.6 g/dL (ref 6.0–8.3)

## 2015-02-22 LAB — CBC WITH DIFFERENTIAL/PLATELET
BASOS ABS: 0 10*3/uL (ref 0.0–0.1)
Basophils Relative: 0.4 % (ref 0.0–3.0)
EOS ABS: 0.3 10*3/uL (ref 0.0–0.7)
Eosinophils Relative: 5.4 % — ABNORMAL HIGH (ref 0.0–5.0)
HCT: 43.1 % (ref 36.0–46.0)
Hemoglobin: 14.6 g/dL (ref 12.0–15.0)
LYMPHS ABS: 1.3 10*3/uL (ref 0.7–4.0)
LYMPHS PCT: 25.8 % (ref 12.0–46.0)
MCHC: 34 g/dL (ref 30.0–36.0)
MCV: 98.2 fl (ref 78.0–100.0)
Monocytes Absolute: 0.4 10*3/uL (ref 0.1–1.0)
Monocytes Relative: 9.2 % (ref 3.0–12.0)
Neutro Abs: 2.9 10*3/uL (ref 1.4–7.7)
Neutrophils Relative %: 59.2 % (ref 43.0–77.0)
Platelets: 238 10*3/uL (ref 150.0–400.0)
RBC: 4.39 Mil/uL (ref 3.87–5.11)
RDW: 12.8 % (ref 11.5–15.5)
WBC: 4.9 10*3/uL (ref 4.0–10.5)

## 2015-02-22 LAB — LIPID PANEL
CHOL/HDL RATIO: 2
Cholesterol: 190 mg/dL (ref 0–200)
HDL: 86.3 mg/dL (ref >39.00)
LDL Cholesterol: 86 mg/dL (ref 0–99)
NONHDL: 103.98
TRIGLYCERIDES: 91 mg/dL (ref 0.0–149.0)
VLDL: 18.2 mg/dL (ref 0.0–40.0)

## 2015-02-22 LAB — TSH: TSH: 1.08 u[IU]/mL (ref 0.35–4.50)

## 2015-02-22 MED ORDER — ALPRAZOLAM 0.25 MG PO TABS: ORAL_TABLET | ORAL | Status: DC

## 2015-02-22 NOTE — Progress Notes (Signed)
Pre visit review using our clinic review tool, if applicable. No additional management support is needed unless otherwise documented below in the visit note. 

## 2015-02-22 NOTE — Assessment & Plan Note (Signed)
Continue to wean down as tolerated.

## 2015-02-22 NOTE — Assessment & Plan Note (Signed)
Reviewed preventive care protocols, scheduled due services, and updated immunizations Discussed nutrition, exercise, diet, and healthy lifestyle.  Orders Placed This Encounter  Procedures  . CBC with Differential/Platelet  . Comprehensive metabolic panel  . Lipid panel  . TSH  . HIV antibody (with reflex)   Pt will call to set up mammogram.  Pap smear done today.

## 2015-02-22 NOTE — Assessment & Plan Note (Signed)
Discussed tx with pt today. Unfortunately I am unaware of much other than coping techniques like music therapy. She will continue to look into this. Xanax rx refille.d

## 2015-02-22 NOTE — Assessment & Plan Note (Signed)
UTD on colon CA screening.

## 2015-02-22 NOTE — Patient Instructions (Signed)
Great to see you. Please call to schedule your mammogram. 

## 2015-02-22 NOTE — Assessment & Plan Note (Signed)
Continue current dose of Crestor. Due for labs today.

## 2015-02-22 NOTE — Assessment & Plan Note (Signed)
She feels symptoms controlled/resolved without rx. Advised to call me if symptoms worsen. The patient indicates understanding of these issues and agrees with the plan.

## 2015-02-22 NOTE — Addendum Note (Signed)
Addended by: Modena Nunnery on: 02/22/2015 08:59 AM   Modules accepted: Orders

## 2015-02-22 NOTE — Progress Notes (Signed)
64  yo very pleasant female here for CPX and follow up of chronic medical conditions.   Well woman- menopausal for past 5 + years.   No h/o abnormal pap smears.  Denies any post menopausal bleeding. Last pap smear was normal and done by me in 07/2012.  No h/o abnormal pap smears.    Received zostavax at [facility] on 05/09/13.  Mammogram 09/20/13.  On HRT- trying to wean herself down.  Colonoscopy UTD-  [facility].  D/15/2012.  Saw her in January for symptoms of anxiety/depression after having a difficult year- lost two of her siblings.  Started her on Wellbutrin which she has since stopped taking.  Feels she no longer needs it.  Diagnosed with tinnitus.  Saw Dr. Redmond Baseman on 01/26/2015.  Note reviewed.  Advised coping techniques mainly.  Xanax does help a little but she tries not to take this too frequently.  Hyperlipidemia- well controlled on Simvastatin 20 mg daily.  Denies any myalgias.   Due for fasting labs. Lab Results  Component Value Date   CHOL 211* 03/03/2014   HDL 93.50 03/03/2014   LDLCALC 102* 03/03/2014   LDLDIRECT 134.6 08/24/2013   TRIG 76.0 03/03/2014   CHOLHDL 2 03/03/2014   Lab Results  Component Value Date   ALT 29 03/03/2014   AST 25 03/03/2014   ALKPHOS 41 03/03/2014   BILITOT 1.0 03/03/2014          . Patient Active Problem List   Diagnosis Date Noted  . Weight gain 06/22/2014  . FH: stroke 02/03/2014  . Adjustment disorder with mixed anxiety and depressed mood 02/03/2014  . Family history of carotid artery stenosis 02/03/2014  . Post-menopause on HRT (hormone replacement therapy) 08/24/2013  . Routine general medical examination at a health care facility 07/02/2011  . Anxiety state 08/23/2008  . ALLERGIC RHINITIS 04/21/2008  . HYPERLIPIDEMIA 04/09/2007  . DIVERTICULOSIS, COLON 08/22/2006  . OSTEOPENIA 02/19/2005   Past Medical History  Diagnosis Date  . Osteopenia 02/19/2005  . Hyperlipidemia   . Urinary incontinence   . Anxiety    Past  Surgical History  Procedure Laterality Date  . Colonoscopy  08/2006  . Cesarean section    . Repeat cesarean section    . Bladder tack     History  Substance Use Topics  . Smoking status: Never Smoker   . Smokeless tobacco: Not on file  . Alcohol Use: Yes   Family History  Problem Relation Age of Onset  . Hyperlipidemia Mother   . Stroke Father   . Hypertension Sister   . Depression Sister   . Alcohol abuse Sister   . Stroke Sister   . Hyperlipidemia Brother   . Cancer Maternal Grandmother     breast  . Diabetes Paternal Grandfather   . Diabetes Brother   . Hypertension Brother   . Depression Brother   . Hyperlipidemia Brother   . Obesity Sister    No Known Allergies Current Outpatient Prescriptions on File Prior to Visit  Medication Sig Dispense Refill  . Biotin 1000 MCG tablet Take 1 tablet (1 mg total) by mouth daily. 180 tablet 1  . Calcium Carbonate-Vitamin D (CALCIUM 600/VITAMIN D) 600-400 MG-UNIT per tablet Take 1 tablet by mouth 2 (two) times daily. 180 tablet 3  . Cholecalciferol ([facility] VITAMIN D3) 1000 UNITS capsule TAKE 1 TABLET BY MOUTH 2 (TWO) TIMES DAILY. 180 capsule 1  . citalopram (CELEXA) 10 MG tablet TAKE 1 TABLET BY MOUTH EVERY DAY X 1 WEEK, THEN  INCREASE TO 2 TABS DAILY 60 tablet 0  . COMBIPATCH 0.05-0.14 MG/DAY PLACE 1 PATCH ONTO THE SKIN TWO TIMES A WEEK 8 patch 6  . CRESTOR 10 MG tablet TAKE 1 TABLET (10 MG TOTAL) BY MOUTH DAILY. 30 tablet 0  . fish oil-omega-3 fatty acids 1000 MG capsule Take 2 g by mouth daily.    . Tretinoin, Facial Wrinkles, 0.02 % CREA Apply topically qhs 1 Tube 0   No current facility-administered medications on file prior to visit.   The PMH, PSH, Social History, Family History, Medications, and allergies have been reviewed in [facility], and have been updated if relevant.  ROS: Review of Systems  Constitutional: Negative.   HENT: Positive for tinnitus. Negative for ear pain, mouth sores, nosebleeds, postnasal drip, rhinorrhea,  sinus pressure, sneezing and sore throat.   Eyes: Negative.   Respiratory: Negative.   Cardiovascular: Negative.   Gastrointestinal: Negative.   Endocrine: Negative.   Genitourinary: Negative.   Musculoskeletal: Negative.   Skin: Negative.   Allergic/Immunologic: Negative.   Neurological: Negative.   Hematological: Negative.   Psychiatric/Behavioral: Negative.      Physical exam: BP 118/72 mmHg  Pulse 68  Temp(Src) 97.7 F (36.5 C) (Oral)  Ht 5' 0.5" (1.537 m)  Wt 126 lb 8 oz (57.38 kg)  BMI 24.29 kg/m2  SpO2 99%   General:  Well-developed,well-nourished,in no acute distress; alert,appropriate and cooperative throughout examination Head:  normocephalic and atraumatic.   Eyes:  vision grossly intact, pupils equal, pupils round, and pupils reactive to light.   Ears:  R ear normal and L ear normal.   Nose:  no external deformity.   Mouth:  good dentition.   Neck:  No deformities, masses, or tenderness noted. Breasts:  No mass, nodules, thickening, tenderness, bulging, retraction, inflamation, nipple discharge or skin changes noted.   Lungs:  Normal respiratory effort, chest expands symmetrically. Lungs are clear to auscultation, no crackles or wheezes. Heart:  Normal rate and regular rhythm. S1 and S2 normal without gallop, murmur, click, rub or other extra sounds. Abdomen:  Bowel sounds positive,abdomen soft and non-tender without masses, organomegaly or hernias noted. Rectal:  no external abnormalities.   Genitalia:  Pelvic Exam:        External: normal female genitalia without lesions or masses        Vagina: normal without lesions or masses        Cervix: normal without lesions or masses        Adnexa: normal bimanual exam without masses or fullness        Uterus: normal by palpation        Pap smear: performed Msk:  No deformity or scoliosis noted of thoracic or lumbar spine.   Extremities:  No clubbing, cyanosis, edema, or deformity noted with normal full range of  motion of all joints.   Neurologic:  alert & oriented X3 and gait normal.   Skin:  Intact without suspicious lesions or rashes Cervical Nodes:  No lymphadenopathy noted Axillary Nodes:  No palpable lymphadenopathy Psych:  Cognition and judgment appear intact. Alert and cooperative with normal attention span and concentration. No apparent delusions, illusions, hallucinations

## 2015-02-23 ENCOUNTER — Other Ambulatory Visit: Payer: Self-pay | Admitting: Family Medicine

## 2015-02-23 LAB — HIV ANTIBODY (ROUTINE TESTING W REFLEX): HIV 1&2 Ab, 4th Generation: NONREACTIVE

## 2015-02-24 ENCOUNTER — Encounter: Payer: Self-pay | Admitting: *Deleted

## 2015-02-24 LAB — CYTOLOGY - PAP

## 2015-04-10 ENCOUNTER — Encounter: Payer: Self-pay | Admitting: Family Medicine

## 2015-04-25 ENCOUNTER — Ambulatory Visit (INDEPENDENT_AMBULATORY_CARE_PROVIDER_SITE_OTHER): Payer: BC Managed Care – PPO | Admitting: Gynecology

## 2015-04-25 ENCOUNTER — Encounter: Payer: Self-pay | Admitting: Gynecology

## 2015-04-25 ENCOUNTER — Encounter: Payer: Self-pay | Admitting: Family Medicine

## 2015-04-25 VITALS — BP 132/86 | Ht 60.25 in | Wt 128.0 lb

## 2015-04-25 DIAGNOSIS — Z23 Encounter for immunization: Secondary | ICD-10-CM | POA: Diagnosis not present

## 2015-04-25 DIAGNOSIS — N951 Menopausal and female climacteric states: Secondary | ICD-10-CM | POA: Diagnosis not present

## 2015-04-25 DIAGNOSIS — M8580 Other specified disorders of bone density and structure, unspecified site: Secondary | ICD-10-CM

## 2015-04-25 DIAGNOSIS — Z9229 Personal history of other drug therapy: Secondary | ICD-10-CM | POA: Insufficient documentation

## 2015-04-25 DIAGNOSIS — F4323 Adjustment disorder with mixed anxiety and depressed mood: Secondary | ICD-10-CM

## 2015-04-25 MED ORDER — VENLAFAXINE HCL 75 MG PO TABS: 75.0000 mg | ORAL_TABLET | Freq: Two times a day (BID) | ORAL | Status: DC

## 2015-04-25 NOTE — Patient Instructions (Addendum)
Venlafaxine tablets What is this medicine? VENLAFAXINE (VEN la fax een) is used to treat depression, anxiety and panic disorder. This medicine may be used for other purposes; ask your health care provider or pharmacist if you have questions. COMMON BRAND NAME(S): Effexor What should I tell my health care provider before I take this medicine? They need to know if you have any of these conditions: -bleeding disorders -glaucoma -heart disease -high blood pressure -high cholesterol -kidney disease -liver disease -low levels of sodium in the blood -mania or bipolar disorder -seizures -suicidal thoughts, plans, or attempt; a previous suicide attempt by you or a family -take medicines that treat or prevent blood clots -thyroid disease -an unusual or allergic reaction to venlafaxine, desvenlafaxine, other medicines, foods, dyes, or preservatives -pregnant or trying to get pregnant -breast-feeding How should I use this medicine? Take this medicine by mouth with a glass of water. Follow the directions on the prescription label. Take it with food. Take your medicine at regular intervals. Do not take your medicine more often than directed. Do not stop taking this medicine suddenly except upon the advice of your doctor. Stopping this medicine too quickly may cause serious side effects or your condition may worsen. A special MedGuide will be given to you by the pharmacist with each prescription and refill. Be sure to read this information carefully each time. Talk to your pediatrician regarding the use of this medicine in children. Special care may be needed. Overdosage: If you think you have taken too much of this medicine contact a poison control center or emergency room at once. NOTE: This medicine is only for you. Do not share this medicine with others. What if I miss a dose? If you miss a dose, take it as soon as you can. If it is almost time for your next dose, take only that dose. Do not take  double or extra doses. What may interact with this medicine? Do not take this medicine with any of the following medications: -certain medicines for fungal infections like fluconazole, itraconazole, ketoconazole, posaconazole, voriconazole -cisapride -desvenlafaxine -dofetilide -dronedarone -duloxetine -levomilnacipran -linezolid -MAOIs like Carbex, Eldepryl, Marplan, Nardil, and Parnate -methylene blue (injected into a vein) -milnacipran -pimozide -thioridazine -ziprasidone This medicine may also interact with the following medications: -aspirin and aspirin-like medicines -certain medicines for depression, anxiety, or psychotic disturbances -certain medicines for migraine headaches like almotriptan, eletriptan, frovatriptan, naratriptan, rizatriptan, sumatriptan, zolmitriptan -certain medicines for sleep -certain medicines that treat or prevent blood clots like dalteparin, enoxaparin, warfarin -cimetidine -clozapine -diuretics -fentanyl -furazolidone -indinavir -isoniazid -lithium -metoprolol -NSAIDS, medicines for pain and inflammation, like ibuprofen or naproxen -other medicines that prolong the QT interval (cause an abnormal heart rhythm) -procarbazine -rasagiline -supplements like St. John's wort, kava kava, valerian -tramadol -tryptophan This list may not describe all possible interactions. Give your health care provider a list of all the medicines, herbs, non-prescription drugs, or dietary supplements you use. Also tell them if you smoke, drink alcohol, or use illegal drugs. Some items may interact with your medicine. What should I watch for while using this medicine? Tell your doctor if your symptoms do not get better or if they get worse. Visit your doctor or health care professional for regular checks on your progress. Because it may take several weeks to see the full effects of this medicine, it is important to continue your treatment as prescribed by your  doctor. Patients and their families should watch out for new or worsening thoughts of suicide or depression. Also watch   out for sudden changes in feelings such as feeling anxious, agitated, panicky, irritable, hostile, aggressive, impulsive, severely restless, overly excited and hyperactive, or not being able to sleep. If this happens, especially at the beginning of treatment or after a change in dose, call your health care professional. This medicine can cause an increase in blood pressure. Check with your doctor for instructions on monitoring your blood pressure while taking this medicine. You may get drowsy or dizzy. Do not drive, use machinery, or do anything that needs mental alertness until you know how this medicine affects you. Do not stand or sit up quickly, especially if you are an older patient. This reduces the risk of dizzy or fainting spells. Alcohol may interfere with the effect of this medicine. Avoid alcoholic drinks. Your mouth may get dry. Chewing sugarless gum, sucking hard candy and drinking plenty of water will help. Contact your doctor if the problem does not go away or is severe. What side effects may I notice from receiving this medicine? Side effects that you should report to your doctor or health care professional as soon as possible: -allergic reactions like skin rash, itching or hives, swelling of the face, lips, or tongue -breathing problems -changes in vision -seizures -suicidal thoughts or other mood changes -trouble passing urine or change in the amount of urine -unusual bleeding or bruising Side effects that usually do not require medical attention (report to your doctor or health care professional if they continue or are bothersome): -change in sex drive or performance -constipation -increased sweating -loss of appetite -nausea -tremors -weight loss This list may not describe all possible side effects. Call your doctor for medical advice about side effects.  You may report side effects to FDA at 1-800-FDA-1088. Where should I keep my medicine? Keep out of the reach of children. Store at a controlled temperature between 20 and 25 degrees C (68 and 77 degrees F), in a dry place. Throw away any unused medicine after the expiration date. NOTE: This sheet is a summary. It may not cover all possible information. If you have questions about this medicine, talk to your doctor, pharmacist, or health care provider.  2015, Elsevier/Gold Standard. (2013-02-02 12:43:55) Bone Densitometry Bone densitometry is a special X-ray that measures your bone density and can be used to help predict your risk of bone fractures. This test is used to determine bone mineral content and density to diagnose osteoporosis. Osteoporosis is the loss of bone that may cause the bone to become weak. Osteoporosis commonly occurs in women entering menopause. However, it may be found in men and in people with other diseases. PREPARATION FOR TEST No preparation necessary. WHO SHOULD BE TESTED?  All women older than 77.  Postmenopausal women (50 to 78) with risk factors for osteoporosis.  People with a previous fracture caused by normal activities.  People with a small body frame (less than 127 poundsor a body mass index [BMI] of less than 21).  People who have a parent with a hip fracture or history of osteoporosis.  People who smoke.  People who have rheumatoid arthritis.  Anyone who engages in excessive alcohol use (more than 3 drinks most days).  Women who experience early menopause. WHEN SHOULD YOU BE RETESTED? Current guidelines suggest that you should wait at least 2 years before doing a bone density test again if your first test was normal.Recent studies indicated that women with normal bone density may be able to wait a few years before needing to repeat a  bone density test. You should discuss this with your caregiver.  NORMAL FINDINGS   Normal: less than standard  deviation below normal (greater than -1).  Osteopenia: 1 to 2.5 standard deviations below normal (-1 to -2.5).  Osteoporosis: greater than 2.5 standard deviations below normal (less than -2.5). Test results are reported as a "T score" and a "Z score."The T score is a number that compares your bone density with the bone density of healthy, young women.The Z score is a number that compares your bone density with the scores of women who are the same age, gender, and race.  Ranges for normal findings may vary among different laboratories and hospitals. You should always check with your doctor after having lab work or other tests done to discuss the meaning of your test results and whether your values are considered within normal limits. MEANING OF TEST  Your caregiver will go over the test results with you and discuss the importance and meaning of your results, as well as treatment options and the need for additional tests if necessary. OBTAINING THE TEST RESULTS It is your responsibility to obtain your test results. Ask the lab or department performing the test when and how you will get your results. Document Released: 07/30/2004 Document Revised: 09/30/2011 Document Reviewed: 08/22/2010 [facility] Patient Information 2015 [place], [place]. This information is not intended to replace advice given to you by your health care provider. Make sure you discuss any questions you have with your health care provider

## 2015-04-25 NOTE — Progress Notes (Signed)
   Patient is a 64 year old new patient to the practice who is here to discuss her vasomotor symptoms and continuation of her hormone replacement therapy. Patient stated that she recently menopause in her early 31s and has been on CombiPatch 0.05-0.14 mg per day which she applied twice a week transdermally. She came off of it several weeks ago because concerns that she had been on it for more than 8 years. She states she has several hot flashes during the day and wakes her up at night as well. Her family doctor has done her Pap smear which was normal. Her mammogram is up-to-date as well as her colonoscopy. Her last bone density study was in the year 2006 and she had osteopenia. She is taking calcium and vitamin D daily along with weightbearing exercises 3-4 times a week.  Patient feels at times she feels depressed and has good family support has no suicidal ideation. She does have moments of anxiety in the past she has used Xanax. We had a discussion of the women's health initiative study in relationship to estrogen and risk of breast cancer. Also we discussed the risk of DVT and pulmonary embolism. We also had an extensive discussion for which literature information was provided dealing with nonhormonal management of menopause associated vasomotor symptoms and recommendations from the [place]. We discussed life changes such as yoga, weight loss and exercise programs as well as relaxation diagnosis. We discussed different dietary supplements to include slowly extract's, black cohosh, and others as well as pollen extract, vitamins, as well as SSRIs and SNRI's because of her depression and anxiety that she is expressing the past before I believe that she will be a good candidate for Effexor (venlafaxine) 75 mg daily and she could use peppermint oral when necessary to apply behind each ear 2-3 times a day in the event of sporadic hot flashes. The risks benefits and pros and cons of the  medication were discussed in detail.  I reviewed her last bone density study from 2006 which indicated that her lowest T score was at the left femoral neck with a value of -1.1 which was described as being in the normal range. Patient will schedule a bone density study here in the office in the next few weeks. We discussed importance of calcium 1200 mg daily with the addition of vitamin D 3 2000 units daily as well as weightbearing exercises 3-4 times a week for osteoporosis prevention.  Greater than 50% of the time was spent counseling and coordinating care of this patient in the menopause on hormone replacement therapy as well as on osteopenia and osteoporosis.

## 2015-04-25 NOTE — Addendum Note (Signed)
Addended by: Thurnell Garbe A on: 04/25/2015 02:10 PM   Modules accepted: Orders

## 2015-05-05 ENCOUNTER — Encounter: Payer: Self-pay | Admitting: Family Medicine

## 2015-05-16 ENCOUNTER — Other Ambulatory Visit: Payer: Self-pay | Admitting: Gynecology

## 2015-05-16 ENCOUNTER — Ambulatory Visit (INDEPENDENT_AMBULATORY_CARE_PROVIDER_SITE_OTHER): Payer: BC Managed Care – PPO

## 2015-05-16 DIAGNOSIS — M8589 Other specified disorders of bone density and structure, multiple sites: Secondary | ICD-10-CM | POA: Diagnosis not present

## 2015-05-16 DIAGNOSIS — M899 Disorder of bone, unspecified: Secondary | ICD-10-CM | POA: Diagnosis not present

## 2015-05-16 DIAGNOSIS — Z13820 Encounter for screening for osteoporosis: Secondary | ICD-10-CM | POA: Diagnosis not present

## 2015-05-16 DIAGNOSIS — M8580 Other specified disorders of bone density and structure, unspecified site: Secondary | ICD-10-CM

## 2015-06-11 ENCOUNTER — Other Ambulatory Visit: Payer: Self-pay | Admitting: Family Medicine

## 2015-06-12 NOTE — Telephone Encounter (Signed)
Rx called in to requested pharmacy 

## 2015-06-12 NOTE — Telephone Encounter (Signed)
Last f/u 02/2015-CPE 

## 2015-06-21 ENCOUNTER — Encounter: Payer: Self-pay | Admitting: Family Medicine

## 2015-06-21 MED ORDER — OMEPRAZOLE 40 MG PO CPDR: 40.0000 mg | DELAYED_RELEASE_CAPSULE | Freq: Every day | ORAL | Status: DC

## 2015-06-21 NOTE — Telephone Encounter (Signed)
No longer on pts med list. pls advise

## 2015-07-10 ENCOUNTER — Encounter: Payer: Self-pay | Admitting: Gynecology

## 2015-07-10 ENCOUNTER — Encounter: Payer: Self-pay | Admitting: Family Medicine

## 2015-07-10 MED ORDER — OMEPRAZOLE 40 MG PO CPDR: 40.0000 mg | DELAYED_RELEASE_CAPSULE | Freq: Every day | ORAL | Status: DC

## 2015-07-10 NOTE — Telephone Encounter (Signed)
Dr. JF-Patient will need Rx as well?  I am not familiar with Relizen.

## 2015-07-10 NOTE — Telephone Encounter (Signed)
rx sent. See message below

## 2015-07-10 NOTE — Telephone Encounter (Signed)
rx sent. See message below. Rerouted back to Dr Deborra Medina

## 2015-07-18 ENCOUNTER — Encounter: Payer: Self-pay | Admitting: Family Medicine

## 2015-07-20 ENCOUNTER — Telehealth: Payer: Self-pay | Admitting: *Deleted

## 2015-07-20 NOTE — Telephone Encounter (Signed)
Pt aware of the below note.

## 2015-07-20 NOTE — Telephone Encounter (Signed)
I have never used it. I would assume instructions are going to be on the bottle. If you google it, it says 2 tablets daily.

## 2015-07-20 NOTE — Telephone Encounter (Signed)
Take one twice a day

## 2015-07-20 NOTE — Telephone Encounter (Signed)
(  Dr.Fernandez patient) pt said Dr.Fernandez told her about medication Relizen and how to order medication, pt said she was never given directions how to take medication. I assume it would be 1 pill daily? Please advise

## 2015-07-20 NOTE — Telephone Encounter (Signed)
Pt aware with the below note, pt said directions are note on package. she is aware Dr.Fernandez is out of the office and asked me to relay this call to him.   Dr.Fernandez is patient to take 2 tablets daily?

## 2015-07-27 ENCOUNTER — Telehealth: Payer: Self-pay

## 2015-07-27 NOTE — Telephone Encounter (Signed)
Pt request status of omeprazole to [facility] [place]; spoke with pharmacist and rx ready for pick up. Pt voiced understanding and will ck with pharmacy.

## 2015-07-31 ENCOUNTER — Encounter: Payer: Self-pay | Admitting: Gynecology

## 2015-07-31 ENCOUNTER — Other Ambulatory Visit: Payer: Self-pay | Admitting: Gynecology

## 2015-08-29 ENCOUNTER — Encounter: Payer: Self-pay | Admitting: Family Medicine

## 2015-08-29 MED ORDER — ROSUVASTATIN CALCIUM 10 MG PO TABS: ORAL_TABLET | ORAL | Status: DC

## 2015-08-29 MED ORDER — OMEPRAZOLE 40 MG PO CPDR: 40.0000 mg | DELAYED_RELEASE_CAPSULE | Freq: Every day | ORAL | Status: DC

## 2015-12-25 ENCOUNTER — Encounter: Payer: Self-pay | Admitting: Family Medicine

## 2016-01-17 ENCOUNTER — Other Ambulatory Visit: Payer: Self-pay | Admitting: Family Medicine

## 2016-01-17 ENCOUNTER — Encounter: Payer: Self-pay | Admitting: Family Medicine

## 2016-01-17 MED ORDER — ALPRAZOLAM 0.25 MG PO TABS: ORAL_TABLET | ORAL | Status: DC

## 2016-01-17 NOTE — Addendum Note (Signed)
Addended by: Modena Nunnery on: 01/17/2016 10:08 AM   Modules accepted: Orders

## 2016-01-17 NOTE — Telephone Encounter (Signed)
Rx called in to requested pharmacy 

## 2016-01-17 NOTE — Telephone Encounter (Signed)
Last f/u 02/2015-CPE 

## 2016-01-22 ENCOUNTER — Encounter: Payer: Self-pay | Admitting: Family Medicine

## 2016-02-01 DIAGNOSIS — M539 Dorsopathy, unspecified: Secondary | ICD-10-CM | POA: Insufficient documentation

## 2016-02-28 ENCOUNTER — Ambulatory Visit (INDEPENDENT_AMBULATORY_CARE_PROVIDER_SITE_OTHER): Payer: BC Managed Care – PPO | Admitting: Family Medicine

## 2016-02-28 ENCOUNTER — Encounter: Payer: Self-pay | Admitting: Family Medicine

## 2016-02-28 VITALS — BP 122/78 | HR 83 | Temp 98.0°F | Ht 60.5 in | Wt 125.5 lb

## 2016-02-28 DIAGNOSIS — Z79890 Hormone replacement therapy (postmenopausal): Secondary | ICD-10-CM

## 2016-02-28 DIAGNOSIS — Z1159 Encounter for screening for other viral diseases: Secondary | ICD-10-CM | POA: Diagnosis not present

## 2016-02-28 DIAGNOSIS — E785 Hyperlipidemia, unspecified: Secondary | ICD-10-CM | POA: Diagnosis not present

## 2016-02-28 DIAGNOSIS — Z0000 Encounter for general adult medical examination without abnormal findings: Secondary | ICD-10-CM

## 2016-02-28 LAB — COMPREHENSIVE METABOLIC PANEL
ALBUMIN: 4.6 g/dL (ref 3.5–5.2)
ALK PHOS: 57 U/L (ref 39–117)
ALT: 30 U/L (ref 0–35)
AST: 23 U/L (ref 0–37)
BUN: 12 mg/dL (ref 6–23)
CO2: 32 mEq/L (ref 19–32)
Calcium: 9.8 mg/dL (ref 8.4–10.5)
Chloride: 103 mEq/L (ref 96–112)
Creatinine, Ser: 0.65 mg/dL (ref 0.40–1.20)
GFR: 97.91 mL/min (ref >60.00)
Glucose, Bld: 95 mg/dL (ref 70–99)
POTASSIUM: 4.2 meq/L (ref 3.5–5.1)
Sodium: 141 mEq/L (ref 135–145)
TOTAL PROTEIN: 7.2 g/dL (ref 6.0–8.3)
Total Bilirubin: 0.6 mg/dL (ref 0.2–1.2)

## 2016-02-28 LAB — LIPID PANEL
CHOLESTEROL: 215 mg/dL — AB (ref 0–200)
HDL: 91.9 mg/dL (ref >39.00)
LDL Cholesterol: 107 mg/dL — ABNORMAL HIGH (ref 0–99)
NonHDL: 123.2
Total CHOL/HDL Ratio: 2
Triglycerides: 81 mg/dL (ref 0.0–149.0)
VLDL: 16.2 mg/dL (ref 0.0–40.0)

## 2016-02-28 LAB — CBC
HEMATOCRIT: 44.2 % (ref 36.0–46.0)
HEMOGLOBIN: 15.2 g/dL — AB (ref 12.0–15.0)
MCHC: 34.3 g/dL (ref 30.0–36.0)
MCV: 96.1 fl (ref 78.0–100.0)
PLATELETS: 254 10*3/uL (ref 150.0–400.0)
RBC: 4.61 Mil/uL (ref 3.87–5.11)
RDW: 12.6 % (ref 11.5–15.5)
WBC: 6 10*3/uL (ref 4.0–10.5)

## 2016-02-28 LAB — TSH: TSH: 1 u[IU]/mL (ref 0.35–4.50)

## 2016-02-28 MED ORDER — ROSUVASTATIN CALCIUM 10 MG PO TABS: ORAL_TABLET | ORAL | 1 refills | Status: DC

## 2016-02-28 MED ORDER — ROSUVASTATIN CALCIUM 10 MG PO TABS
ORAL_TABLET | ORAL | 3 refills | Status: DC
Start: 2016-02-28 — End: 2017-02-13

## 2016-02-28 NOTE — Assessment & Plan Note (Signed)
Reviewed preventive care protocols, scheduled due services, and updated immunizations Discussed nutrition, exercise, diet, and healthy lifestyle.  

## 2016-02-28 NOTE — Patient Instructions (Signed)
Great to see you. I will call you with your lab results and you can view them online. 

## 2016-02-28 NOTE — Progress Notes (Signed)
Pre visit review using our clinic review tool, if applicable. No additional management support is needed unless otherwise documented below in the visit note. 

## 2016-02-28 NOTE — Addendum Note (Signed)
Addended by: Lucille Passy on: 02/28/2016 10:00 AM   Modules accepted: Orders

## 2016-02-28 NOTE — Progress Notes (Signed)
65 yo very pleasant female here for CPX and follow up of chronic medical conditions.   Well woman- menopausal for past 5 + years.   No h/o abnormal pap smears.  Denies any post menopausal bleeding. Last pap smear was normal and done by me on 02/22/15.  No h/o abnormal pap smears.    Received zostavax at [facility] on 05/09/13.  Mammogram 05/04/15  Weaned herself off of HRT.  Colonoscopy UTD-  [facility]-2012.   Hyperlipidemia- well controlled on Crestor 10 mg daily.  Denies any myalgias.   Due for fasting labs. Lab Results  Component Value Date   CHOL 190 02/22/2015   HDL 86.30 02/22/2015   LDLCALC 86 02/22/2015   LDLDIRECT 134.6 08/24/2013   TRIG 91.0 02/22/2015   CHOLHDL 2 02/22/2015   Lab Results  Component Value Date   ALT 22 02/22/2015   AST 21 02/22/2015   ALKPHOS 41 02/22/2015   BILITOT 0.7 02/22/2015          . Patient Active Problem List  Diagnosis  . HLD (hyperlipidemia)  . ALLERGIC RHINITIS  . Diverticulosis of large intestine  . OSTEOPENIA  . Routine general medical examination at a health care facility  . Post-menopause on HRT (hormone replacement therapy)  . FH: stroke  . Family history of carotid artery stenosis  . Osteopenia  . History of postmenopausal HRT   Past Medical History:  Diagnosis Date  . Anxiety   . Hyperlipidemia   . Osteopenia 02/19/2005  . Urinary incontinence    Past Surgical History:  Procedure Laterality Date  . bladder tack    . CESAREAN SECTION    . COLONOSCOPY  08/2006  . REPEAT CESAREAN SECTION     Social History  Substance Use Topics  . Smoking status: Never Smoker  . Smokeless tobacco: Not on file  . Alcohol use 0.0 oz/week   Family History  Problem Relation Age of Onset  . Hyperlipidemia Mother   . Stroke Father   . Hypertension Sister   . Depression Sister   . Alcohol abuse Sister   . Stroke Sister   . Hyperlipidemia Brother   . Cancer Brother     ESOPHAGEAL  . Cancer Maternal Grandmother     breast   . Diabetes Paternal Grandfather   . Diabetes Brother   . Hypertension Brother   . Depression Brother   . Hyperlipidemia Brother   . Obesity Sister    No Known Allergies Current Outpatient Prescriptions on File Prior to Visit  Medication Sig Dispense Refill  . ALPRAZolam (XANAX) 0.25 MG tablet TAKE 1 TO 2 TABLETS BY MOUTH EVERY 6 HOURS AS NEEDED FOR ANXIETY 30 tablet 0  . Biotin 1000 MCG tablet Take 1 tablet (1 mg total) by mouth daily. 180 tablet 1  . Calcium Carbonate-Vitamin D (CALCIUM 600/VITAMIN D) 600-400 MG-UNIT per tablet Take 1 tablet by mouth 2 (two) times daily. 180 tablet 3  . Cholecalciferol ([facility] VITAMIN D3) 1000 UNITS capsule TAKE 1 TABLET BY MOUTH 2 (TWO) TIMES DAILY. 180 capsule 1  . fish oil-omega-3 fatty acids 1000 MG capsule Take 2 g by mouth daily.    Marland Kitchen omeprazole (PRILOSEC) 40 MG capsule Take 1 capsule (40 mg total) by mouth daily. 90 capsule 1  . Tretinoin, Facial Wrinkles, 0.02 % CREA Apply topically qhs 1 Tube 0   No current facility-administered medications on file prior to visit.    The PMH, PSH, Social History, Family History, Medications, and allergies have been reviewed in  [facility], and have been updated if relevant.  ROS: Review of Systems  Constitutional: Negative.   HENT: Negative for ear pain, mouth sores, nosebleeds, postnasal drip, rhinorrhea, sinus pressure, sneezing, sore throat and tinnitus.   Eyes: Negative.   Respiratory: Negative.   Cardiovascular: Negative.   Gastrointestinal: Negative.   Endocrine: Negative.   Genitourinary: Negative.   Musculoskeletal: Negative.   Skin: Negative.   Allergic/Immunologic: Negative.   Neurological: Negative.   Hematological: Negative.   Psychiatric/Behavioral: Negative.      Physical exam: BP 122/78   Pulse 83   Temp 98 F (36.7 C) (Oral)   Ht 5' 0.5" (1.537 m)   Wt 125 lb 8 oz (56.9 kg)   SpO2 96%   BMI 24.11 kg/m    General:  Well-developed,well-nourished,in no acute distress;  alert,appropriate and cooperative throughout examination Head:  normocephalic and atraumatic.   Eyes:  vision grossly intact, pupils equal, pupils round, and pupils reactive to light.   Ears:  R ear normal and L ear normal.   Nose:  no external deformity.   Mouth:  good dentition.   Neck:  No deformities, masses, or tenderness noted. Breasts:  No mass, nodules, thickening, tenderness, bulging, retraction, inflamation, nipple discharge or skin changes noted.   Lungs:  Normal respiratory effort, chest expands symmetrically. Lungs are clear to auscultation, no crackles or wheezes. Heart:  Normal rate and regular rhythm. S1 and S2 normal without gallop, murmur, click, rub or other extra sounds. Abdomen:  Bowel sounds positive,abdomen soft and non-tender without masses, organomegaly or hernias noted. Msk:  No deformity or scoliosis noted of thoracic or lumbar spine.   Extremities:  No clubbing, cyanosis, edema, or deformity noted with normal full range of motion of all joints.   Neurologic:  alert & oriented X3 and gait normal.   Skin:  Intact without suspicious lesions or rashes Cervical Nodes:  No lymphadenopathy noted Axillary Nodes:  No palpable lymphadenopathy Psych:  Cognition and judgment appear intact. Alert and cooperative with normal attention span and concentration. No apparent delusions, illusions, hallucinations

## 2016-02-28 NOTE — Assessment & Plan Note (Signed)
Continue current dose of crestor. Due for labs today.

## 2016-02-29 LAB — HEPATITIS C ANTIBODY: HCV Ab: NEGATIVE

## 2016-03-12 ENCOUNTER — Other Ambulatory Visit: Payer: Self-pay | Admitting: Family Medicine

## 2016-03-13 NOTE — Telephone Encounter (Signed)
Last f/u 02/2016 

## 2016-03-14 NOTE — Telephone Encounter (Signed)
Rx called in to requested pharmacy 

## 2016-05-02 ENCOUNTER — Encounter: Payer: Self-pay | Admitting: Family Medicine

## 2016-06-05 ENCOUNTER — Other Ambulatory Visit: Payer: Self-pay | Admitting: Family Medicine

## 2016-06-05 NOTE — Telephone Encounter (Signed)
Rx called in to requested pharmacy 

## 2016-06-05 NOTE — Telephone Encounter (Signed)
Last f/u 02/2016-CPE 

## 2016-07-22 ENCOUNTER — Other Ambulatory Visit: Payer: Self-pay | Admitting: Family Medicine

## 2016-07-29 ENCOUNTER — Encounter: Payer: Self-pay | Admitting: Family Medicine

## 2016-08-20 ENCOUNTER — Encounter: Payer: Self-pay | Admitting: Family Medicine

## 2016-09-02 ENCOUNTER — Encounter: Payer: Self-pay | Admitting: Internal Medicine

## 2016-09-03 ENCOUNTER — Other Ambulatory Visit: Payer: Self-pay | Admitting: Family Medicine

## 2016-09-23 ENCOUNTER — Encounter: Payer: Self-pay | Admitting: Family Medicine

## 2016-09-30 ENCOUNTER — Encounter: Payer: Self-pay | Admitting: Family Medicine

## 2016-09-30 ENCOUNTER — Ambulatory Visit (INDEPENDENT_AMBULATORY_CARE_PROVIDER_SITE_OTHER): Payer: BC Managed Care – PPO | Admitting: Family Medicine

## 2016-09-30 VITALS — BP 124/80 | HR 70 | Temp 97.6°F | Wt 129.0 lb

## 2016-09-30 DIAGNOSIS — N62 Hypertrophy of breast: Secondary | ICD-10-CM | POA: Insufficient documentation

## 2016-09-30 DIAGNOSIS — G8929 Other chronic pain: Secondary | ICD-10-CM

## 2016-09-30 DIAGNOSIS — Z23 Encounter for immunization: Secondary | ICD-10-CM | POA: Diagnosis not present

## 2016-09-30 DIAGNOSIS — M545 Low back pain, unspecified: Secondary | ICD-10-CM | POA: Insufficient documentation

## 2016-09-30 NOTE — Assessment & Plan Note (Signed)
With low back pain. Refer to surgeon to discuss breast reduction. The patient indicates understanding of these issues and agrees with the plan.

## 2016-09-30 NOTE — Patient Instructions (Signed)
Great to see you. We are referring you to a surgeon.

## 2016-09-30 NOTE — Progress Notes (Signed)
Pre visit review using our clinic review tool, if applicable. No additional management support is needed unless otherwise documented below in the visit note. 

## 2016-09-30 NOTE — Addendum Note (Signed)
Addended by: Pilar Grammes on: 09/30/2016 10:53 AM   Modules accepted: Orders

## 2016-09-30 NOTE — Progress Notes (Signed)
Subjective:   Patient ID: Patricia Hampton, female    DOB: Apr 03, 1951, 66 y.o.   MRN: [id number]  Patricia Hampton is a pleasant 66 y.o. year old female who presents to clinic today with Back Pain (Discuss Breast Reduction); Tdap; and UDS (Lab sheet says she is due for UDS)  on 09/30/2016  HPI:  Has had thoracic back pain for years.  Physically active, cannot find a bra that doesn't cause discomfort- she is a small framed woman but breasts are a 34G.  Asking about breast reduction surgery.   Current Outpatient Prescriptions on File Prior to Visit  Medication Sig Dispense Refill  . ALPRAZolam (XANAX) 0.25 MG tablet TAKE ONE TO TWO TABLETS BY MOUTH EVERY 6 HOURS AS NEEDED FOR ANXIETY 30 tablet 0  . Biotin 1000 MCG tablet Take 1 tablet (1 mg total) by mouth daily. 180 tablet 1  . Calcium Carbonate-Vitamin D (CALCIUM 600/VITAMIN D) 600-400 MG-UNIT per tablet Take 1 tablet by mouth 2 (two) times daily. 180 tablet 3  . Cholecalciferol ([facility] VITAMIN D3) 1000 UNITS capsule TAKE 1 TABLET BY MOUTH 2 (TWO) TIMES DAILY. 180 capsule 1  . fish oil-omega-3 fatty acids 1000 MG capsule Take 2 g by mouth daily.    Marland Kitchen omeprazole (PRILOSEC) 40 MG capsule TAKE ONE CAPSULE BY MOUTH ONCE DAILY 90 capsule 1  . rosuvastatin (CRESTOR) 10 MG tablet TAKE 1 TABLET (10 MG TOTAL) BY MOUTH DAILY. 90 tablet 3  . Tretinoin, Facial Wrinkles, 0.02 % CREA Apply topically qhs 1 Tube 0   No current facility-administered medications on file prior to visit.     No Known Allergies  Past Medical History:  Diagnosis Date  . Anxiety   . Hyperlipidemia   . Osteopenia 02/19/2005  . Urinary incontinence     Past Surgical History:  Procedure Laterality Date  . bladder tack    . CESAREAN SECTION    . COLONOSCOPY  08/2006  . REPEAT CESAREAN SECTION      Family History  Problem Relation Age of Onset  . Hyperlipidemia Mother   . Stroke Father   . Hypertension Sister   . Depression Sister   . Alcohol abuse  Sister   . Stroke Sister   . Hyperlipidemia Brother   . Cancer Brother     ESOPHAGEAL  . Cancer Maternal Grandmother     breast  . Diabetes Paternal Grandfather   . Diabetes Brother   . Hypertension Brother   . Depression Brother   . Hyperlipidemia Brother   . Obesity Sister     Social History   Social History  . Marital status: Married    Spouse name: N/A  . Number of children: 2  . Years of education: N/A   Occupational History  .  [employer] History Main Topics  . Smoking status: Never Smoker  . Smokeless tobacco: Never Used  . Alcohol use 0.0 oz/week  . Drug use: No  . Sexual activity: Yes   Other Topics Concern  . Not on file   Social History Narrative  . No narrative on file   The PMH, PSH, Social History, Family History, Medications, and allergies have been reviewed in [facility], and have been updated if relevant.   Review of Systems  Musculoskeletal: Positive for back pain.  All other systems reviewed and are negative.      Objective:    BP 124/80 (BP Location: Left Arm, Patient Position: Sitting, Cuff Size: Normal)  Pulse 70   Temp 97.6 F (36.4 C) (Oral)   Wt 129 lb (58.5 kg)   SpO2 98%   BMI 24.78 kg/m    Physical Exam  Constitutional: She is oriented to person, place, and time. She appears well-developed and well-nourished. No distress.  HENT:  Head: Normocephalic and atraumatic.  Eyes: Conjunctivae are normal.  Cardiovascular: Normal rate.   Pulmonary/Chest: Effort normal.  Neurological: She is alert and oriented to person, place, and time. No cranial nerve deficit.  Skin: Skin is warm and dry. She is not diaphoretic.  Psychiatric: She has a normal mood and affect. Her behavior is normal. Judgment and thought content normal.  Nursing note and vitals reviewed.         Assessment & Plan:   Chronic low back pain, unspecified back pain laterality, with sciatica presence unspecified No Follow-up on file.

## 2016-10-08 ENCOUNTER — Other Ambulatory Visit: Payer: Self-pay | Admitting: Family Medicine

## 2016-10-08 NOTE — Telephone Encounter (Signed)
Last office visit 09/30/2016.  Ok to refill?

## 2016-10-09 MED ORDER — TRETINOIN 0.025 % EX CREA: TOPICAL_CREAM | Freq: Every day | CUTANEOUS | 0 refills | Status: DC

## 2016-10-09 MED ORDER — TRETINOIN (FACIAL WRINKLES) 0.02 % EX CREA: TOPICAL_CREAM | CUTANEOUS | 0 refills | Status: DC

## 2016-10-09 NOTE — Telephone Encounter (Signed)
[facility] called to get tretinoin 0.025% cream; pharmacy received 0.02% cream and they do not have that. Advised [facility] that rx resent for 0.025%. Pharmacist voiced understanding.

## 2016-10-09 NOTE — Addendum Note (Signed)
Addended by: Carter Kitten on: 10/09/2016 03:50 PM   Modules accepted: Orders

## 2016-10-09 NOTE — Telephone Encounter (Signed)
Received fax from [facility] stating [facility] comes in 0.025% cream.  Ok to change?

## 2016-10-22 ENCOUNTER — Encounter: Payer: Self-pay | Admitting: Family Medicine

## 2016-11-27 ENCOUNTER — Encounter: Payer: Self-pay | Admitting: Family Medicine

## 2016-11-27 ENCOUNTER — Other Ambulatory Visit: Payer: Self-pay | Admitting: Family Medicine

## 2016-11-27 DIAGNOSIS — G8929 Other chronic pain: Secondary | ICD-10-CM

## 2016-11-27 DIAGNOSIS — M545 Low back pain: Principal | ICD-10-CM

## 2016-11-28 ENCOUNTER — Other Ambulatory Visit: Payer: Self-pay

## 2016-11-28 MED ORDER — ALPRAZOLAM 0.25 MG PO TABS: ORAL_TABLET | ORAL | 0 refills | Status: DC

## 2016-11-28 NOTE — Telephone Encounter (Signed)
Pt left v/m requesting status of order for PT;pts ins co wants pt to try PT before trying anything further for back pain. Pt also request refill xanax. Last refilled # 30 on 06/05/16. Pt last seen 09/30/16; [facility] [place].

## 2016-11-29 ENCOUNTER — Other Ambulatory Visit: Payer: Self-pay | Admitting: Family Medicine

## 2016-11-29 NOTE — Telephone Encounter (Signed)
Left refill on voice mail at pharmacy  

## 2016-12-04 ENCOUNTER — Encounter: Payer: Self-pay | Admitting: Gynecology

## 2016-12-04 ENCOUNTER — Encounter: Payer: Self-pay | Admitting: Family Medicine

## 2017-01-15 ENCOUNTER — Encounter: Payer: Self-pay | Admitting: Family Medicine

## 2017-01-17 ENCOUNTER — Encounter: Payer: Self-pay | Admitting: Family Medicine

## 2017-01-20 ENCOUNTER — Encounter: Payer: Self-pay | Admitting: Family Medicine

## 2017-02-06 ENCOUNTER — Ambulatory Visit (INDEPENDENT_AMBULATORY_CARE_PROVIDER_SITE_OTHER): Payer: BC Managed Care – PPO | Admitting: Family Medicine

## 2017-02-06 ENCOUNTER — Encounter: Payer: Self-pay | Admitting: Family Medicine

## 2017-02-06 DIAGNOSIS — M542 Cervicalgia: Secondary | ICD-10-CM | POA: Diagnosis not present

## 2017-02-06 DIAGNOSIS — M549 Dorsalgia, unspecified: Secondary | ICD-10-CM | POA: Diagnosis not present

## 2017-02-06 NOTE — Progress Notes (Signed)
Subjective:   Patient ID: Patricia Hampton, female    DOB: [birth year], 66 y.o.   MRN: [id number]  Patricia Hampton is a pleasant 66 y.o. year old female who presents to clinic today with Follow-up (BREAST REDUCTION )  on 02/06/2017  HPI:  Has been going to PT for back and shoulder/neck pain. Completed 7 sessions but pain persists.  Her large breasts have been leading to these symptoms for years.  This is interfering with her ability to exercise which is really upsetting her because she has such a strong family history of premature CAD and would like to take care of her body the best she can.  She is interested in a breast reduction.    Current Outpatient Prescriptions on File Prior to Visit  Medication Sig Dispense Refill  . ALPRAZolam (XANAX) 0.25 MG tablet TAKE ONE TO TWO TABLETS BY MOUTH EVERY 6 HOURS AS NEEDED FOR ANXIETY 30 tablet 0  . Biotin 1000 MCG tablet Take 1 tablet (1 mg total) by mouth daily. 180 tablet 1  . Calcium Carbonate-Vitamin D (CALCIUM 600/VITAMIN D) 600-400 MG-UNIT per tablet Take 1 tablet by mouth 2 (two) times daily. 180 tablet 3  . Cholecalciferol ([facility]S VITAMIN D3) 1000 UNITS capsule TAKE 1 TABLET BY MOUTH 2 (TWO) TIMES DAILY. 180 capsule 1  . fish oil-omega-3 fatty acids 1000 MG capsule Take 2 g by mouth daily.    Marland Kitchen omeprazole (PRILOSEC) 40 MG capsule TAKE ONE CAPSULE BY MOUTH ONCE DAILY 90 capsule 1  . rosuvastatin (CRESTOR) 10 MG tablet TAKE 1 TABLET (10 MG TOTAL) BY MOUTH DAILY. 90 tablet 3  . tretinoin (RETIN-A) 0.025 % cream Apply topically at bedtime. 45 g 0  . Tretinoin, Facial Wrinkles, 0.02 % CREA Apply topically qhs 1 Tube 0   No current facility-administered medications on file prior to visit.     No Known Allergies  Past Medical History:  Diagnosis Date  . Anxiety   . Hyperlipidemia   . Osteopenia 02/19/2005  . Urinary incontinence     Past Surgical History:  Procedure Laterality Date  . bladder tack    . CESAREAN SECTION      . COLONOSCOPY  08/2006  . REPEAT CESAREAN SECTION      Family History  Problem Relation Age of Onset  . Hyperlipidemia Mother   . Stroke Father   . Hypertension Sister   . Depression Sister   . Alcohol abuse Sister   . Stroke Sister   . Hyperlipidemia Brother   . Cancer Brother        ESOPHAGEAL  . Cancer Maternal Grandmother        breast  . Diabetes Paternal Grandfather   . Diabetes Brother   . Hypertension Brother   . Depression Brother   . Hyperlipidemia Brother   . Obesity Sister     Social History   Social History  . Marital status: Married    Spouse name: N/A  . Number of children: 2  . Years of education: N/A   Occupational History  .  [employer] History Main Topics  . Smoking status: Never Smoker  . Smokeless tobacco: Never Used  . Alcohol use 0.0 oz/week  . Drug use: No  . Sexual activity: Yes   Other Topics Concern  . Not on file   Social History Narrative  . No narrative on file   The PMH, PSH, Social History, Family History, Medications, and allergies have been reviewed  in [facility], and have been updated if relevant.   Review of Systems  Musculoskeletal: Positive for arthralgias, back pain, neck pain and neck stiffness.  All other systems reviewed and are negative.      Objective:    BP 110/70   Pulse 69   Ht 5' 0.05" (1.525 m)   Wt 126 lb (57.2 kg)   SpO2 98%   BMI 24.57 kg/m    Physical Exam  Constitutional: She is oriented to person, place, and time. She appears well-developed and well-nourished. No distress.  HENT:  Head: Normocephalic and atraumatic.  Cardiovascular: Normal rate.   Pulmonary/Chest: Effort normal.  Neurological: She is alert and oriented to person, place, and time. No cranial nerve deficit.  Skin: Skin is warm and dry. She is not diaphoretic.  Psychiatric: She has a normal mood and affect. Her behavior is normal. Judgment and thought content normal.  Nursing note and vitals  reviewed.         Assessment & Plan:   Upper back pain  Neck pain No Follow-up on file.

## 2017-02-06 NOTE — Assessment & Plan Note (Signed)
With upper neck, shoulder and lower back pain as well. >15 minutes spent in face to face time with patient, >50% spent in counselling or coordination of care. I agree that she would benefit medically from a breast reduction and offered to write a letter to her breast surgeon stating this.

## 2017-02-07 ENCOUNTER — Encounter: Payer: Self-pay | Admitting: Family Medicine

## 2017-02-07 NOTE — Progress Notes (Signed)
Please fax this letter to her surgeon, Dr. Crissie Reese.

## 2017-02-11 ENCOUNTER — Encounter: Payer: Self-pay | Admitting: Family Medicine

## 2017-02-13 ENCOUNTER — Other Ambulatory Visit: Payer: Self-pay

## 2017-02-13 MED ORDER — ROSUVASTATIN CALCIUM 10 MG PO TABS: ORAL_TABLET | ORAL | 3 refills | Status: DC

## 2017-02-13 MED ORDER — OMEPRAZOLE 40 MG PO CPDR
40.0000 mg | DELAYED_RELEASE_CAPSULE | Freq: Every day | ORAL | 3 refills | Status: DC
Start: 2017-02-13 — End: 2017-04-14

## 2017-02-24 ENCOUNTER — Other Ambulatory Visit: Payer: Self-pay | Admitting: Family Medicine

## 2017-02-24 NOTE — Telephone Encounter (Signed)
Received refill request electronically Last refill 11/28/16 #30 Last office visit 02/06/17

## 2017-02-24 NOTE — Telephone Encounter (Signed)
Rx called to pharmacy as instructed. 

## 2017-04-14 ENCOUNTER — Encounter: Payer: Self-pay | Admitting: Family Medicine

## 2017-04-14 ENCOUNTER — Other Ambulatory Visit: Payer: Self-pay | Admitting: Family Medicine

## 2017-04-14 DIAGNOSIS — Z1211 Encounter for screening for malignant neoplasm of colon: Secondary | ICD-10-CM

## 2017-04-14 MED ORDER — OMEPRAZOLE 40 MG PO CPDR: 40.0000 mg | DELAYED_RELEASE_CAPSULE | Freq: Every day | ORAL | 3 refills | Status: DC

## 2017-04-14 NOTE — Telephone Encounter (Signed)
Rx called in as prescribed, paper Rx destroyed

## 2017-04-14 NOTE — Telephone Encounter (Signed)
Last refill 02/24/17  Last OV 02/06/17  Ok to refill?

## 2017-04-21 ENCOUNTER — Other Ambulatory Visit: Payer: Self-pay | Admitting: Plastic Surgery

## 2017-04-21 ENCOUNTER — Encounter: Payer: Self-pay | Admitting: Internal Medicine

## 2017-04-21 DIAGNOSIS — Z01818 Encounter for other preprocedural examination: Secondary | ICD-10-CM

## 2017-04-29 ENCOUNTER — Encounter: Payer: Self-pay | Admitting: Family Medicine

## 2017-05-05 ENCOUNTER — Encounter: Payer: Self-pay | Admitting: Family Medicine

## 2017-05-05 ENCOUNTER — Ambulatory Visit: Payer: Self-pay | Admitting: Family Medicine

## 2017-05-06 ENCOUNTER — Ambulatory Visit
Admission: RE | Admit: 2017-05-06 | Discharge: 2017-05-06 | Disposition: A | Discharge status: Home / Self Care | Payer: BC Managed Care – PPO | Source: Ambulatory Visit | Attending: Plastic Surgery | Admitting: Plastic Surgery

## 2017-05-06 ENCOUNTER — Ambulatory Visit (INDEPENDENT_AMBULATORY_CARE_PROVIDER_SITE_OTHER): Payer: BC Managed Care – PPO | Admitting: Family Medicine

## 2017-05-06 ENCOUNTER — Encounter: Payer: Self-pay | Admitting: Family Medicine

## 2017-05-06 VITALS — BP 118/64 | HR 81 | Temp 97.8°F | Ht 61.0 in | Wt 121.8 lb

## 2017-05-06 DIAGNOSIS — Z01818 Encounter for other preprocedural examination: Secondary | ICD-10-CM

## 2017-05-06 DIAGNOSIS — Z01419 Encounter for gynecological examination (general) (routine) without abnormal findings: Secondary | ICD-10-CM | POA: Diagnosis not present

## 2017-05-06 DIAGNOSIS — E785 Hyperlipidemia, unspecified: Secondary | ICD-10-CM

## 2017-05-06 LAB — LIPID PANEL
CHOLESTEROL: 212 mg/dL — AB (ref 0–200)
HDL: 88.2 mg/dL (ref >39.00)
LDL CALC: 111 mg/dL — AB (ref 0–99)
NonHDL: 123.7
TRIGLYCERIDES: 66 mg/dL (ref 0.0–149.0)
Total CHOL/HDL Ratio: 2
VLDL: 13.2 mg/dL (ref 0.0–40.0)

## 2017-05-06 LAB — CBC WITH DIFFERENTIAL/PLATELET
BASOS ABS: 0 10*3/uL (ref 0.0–0.1)
Basophils Relative: 0.7 % (ref 0.0–3.0)
EOS ABS: 0.3 10*3/uL (ref 0.0–0.7)
Eosinophils Relative: 4.2 % (ref 0.0–5.0)
HEMATOCRIT: 44.2 % (ref 36.0–46.0)
HEMOGLOBIN: 15.2 g/dL — AB (ref 12.0–15.0)
LYMPHS PCT: 32.2 % (ref 12.0–46.0)
Lymphs Abs: 1.9 10*3/uL (ref 0.7–4.0)
MCHC: 34.4 g/dL (ref 30.0–36.0)
MCV: 97.9 fl (ref 78.0–100.0)
MONO ABS: 0.7 10*3/uL (ref 0.1–1.0)
Monocytes Relative: 11 % (ref 3.0–12.0)
Neutro Abs: 3.1 10*3/uL (ref 1.4–7.7)
Neutrophils Relative %: 51.9 % (ref 43.0–77.0)
PLATELETS: 271 10*3/uL (ref 150.0–400.0)
RBC: 4.52 Mil/uL (ref 3.87–5.11)
RDW: 12.6 % (ref 11.5–15.5)
WBC: 6 10*3/uL (ref 4.0–10.5)

## 2017-05-06 LAB — COMPREHENSIVE METABOLIC PANEL
ALBUMIN: 4.6 g/dL (ref 3.5–5.2)
ALK PHOS: 64 U/L (ref 39–117)
ALT: 21 U/L (ref 0–35)
AST: 19 U/L (ref 0–37)
BUN: 13 mg/dL (ref 6–23)
CALCIUM: 10 mg/dL (ref 8.4–10.5)
CHLORIDE: 104 meq/L (ref 96–112)
CO2: 30 mEq/L (ref 19–32)
Creatinine, Ser: 0.73 mg/dL (ref 0.40–1.20)
GFR: 85.31 mL/min (ref >60.00)
Glucose, Bld: 95 mg/dL (ref 70–99)
POTASSIUM: 5.2 meq/L — AB (ref 3.5–5.1)
Sodium: 143 mEq/L (ref 135–145)
TOTAL PROTEIN: 7.4 g/dL (ref 6.0–8.3)
Total Bilirubin: 0.6 mg/dL (ref 0.2–1.2)

## 2017-05-06 LAB — TSH: TSH: 1.47 u[IU]/mL (ref 0.35–4.50)

## 2017-05-06 NOTE — Assessment & Plan Note (Signed)
Labs today

## 2017-05-06 NOTE — Progress Notes (Signed)
66 yo very pleasant female here for CPX and follow up of chronic medical conditions.   Well woman- menopausal for past 5 + years.   No h/o abnormal pap smears.  Denies any post menopausal bleeding. Last pap smear was normal and done by me on 02/22/15.  No h/o abnormal pap smears.    [facility].  Mammogram 07/29/16  Colonoscopy December 2008  Health Maintenance  Topic Date Due  . MAMMOGRAM  07/29/2017  . PAP SMEAR  02/21/2018  . COLONOSCOPY  12/03/2020  . TETANUS/TDAP  10/01/2026  . INFLUENZA VACCINE  Completed  . Hepatitis C Screening  Completed  . HIV Screening  Completed   Hyperlipidemia- well controlled on Crestor 10 mg daily.  Denies any myalgias.   Due for fasting labs. Lab Results  Component Value Date   CHOL 215 (H) 02/28/2016   HDL 91.90 02/28/2016   LDLCALC 107 (H) 02/28/2016   LDLDIRECT 134.6 08/24/2013   TRIG 81.0 02/28/2016   CHOLHDL 2 02/28/2016   Lab Results  Component Value Date   ALT 30 02/28/2016   AST 23 02/28/2016   ALKPHOS 57 02/28/2016   BILITOT 0.6 02/28/2016   Lab Results  Component Value Date   WBC 6.0 02/28/2016   HGB 15.2 (H) 02/28/2016   HCT 44.2 02/28/2016   MCV 96.1 02/28/2016   PLT 254.0 02/28/2016   Lab Results  Component Value Date   TSH 1.00 02/28/2016    . Patient Active Problem List   Diagnosis Date Noted  . Well woman exam 05/06/2017  . Osteopenia 04/25/2015  . History of postmenopausal HRT 04/25/2015  . FH: stroke 02/03/2014  . Family history of carotid artery stenosis 02/03/2014  . Post-menopause on HRT (hormone replacement therapy) 08/24/2013  . ALLERGIC RHINITIS 04/21/2008  . HLD (hyperlipidemia) 04/09/2007  . Diverticulosis of large intestine 08/22/2006  . OSTEOPENIA 02/19/2005   Past Medical History:  Diagnosis Date  . Anxiety   . Hyperlipidemia   . Osteopenia 02/19/2005  . Urinary incontinence    Past Surgical History:  Procedure Laterality Date  . bladder tack    .  CESAREAN SECTION    . COLONOSCOPY  08/2006  . REPEAT CESAREAN SECTION     Social History  Substance Use Topics  . Smoking status: Never Smoker  . Smokeless tobacco: Never Used  . Alcohol use 0.0 oz/week   Family History  Problem Relation Age of Onset  . Hyperlipidemia Mother   . Stroke Father   . Hypertension Sister   . Depression Sister   . Alcohol abuse Sister   . Stroke Sister   . Hyperlipidemia Brother   . Cancer Brother        ESOPHAGEAL  . Cancer Maternal Grandmother        breast  . Diabetes Paternal Grandfather   . Diabetes Brother   . Hypertension Brother   . Depression Brother   . Hyperlipidemia Brother   . Obesity Sister    No Known Allergies Current Outpatient Prescriptions on File Prior to Visit  Medication Sig Dispense Refill  . ALPRAZolam (XANAX) 0.25 MG tablet TAKE 1 TO 2 TABLETS BY MOUTH EVERY 6 HOURS AS NEEDED FOR ANXIETY 30 tablet 0  . Biotin 1000 MCG tablet Take 1 tablet (1 mg total) by mouth daily. 180 tablet 1  . Calcium Carbonate-Vitamin D (CALCIUM 600/VITAMIN D) 600-400 MG-UNIT per tablet Take 1 tablet by mouth 2 (two) times daily. 180 tablet 3  . Cholecalciferol ([facility]  VITAMIN D3) 1000 UNITS capsule TAKE 1 TABLET BY MOUTH 2 (TWO) TIMES DAILY. 180 capsule 1  . fish oil-omega-3 fatty acids 1000 MG capsule Take 2 g by mouth daily.    Marland Kitchen omeprazole (PRILOSEC) 40 MG capsule Take 1 capsule (40 mg total) by mouth daily. 90 capsule 3  . rosuvastatin (CRESTOR) 10 MG tablet TAKE 1 TABLET (10 MG TOTAL) BY MOUTH DAILY. 90 tablet 3  . tretinoin (RETIN-A) 0.025 % cream Apply topically at bedtime. 45 g 0   No current facility-administered medications on file prior to visit.    The PMH, PSH, Social History, Family History, Medications, and allergies have been reviewed in [facility], and have been updated if relevant.  ROS: Review of Systems  Constitutional: Negative.   HENT: Negative for ear pain, mouth sores, nosebleeds, postnasal drip, rhinorrhea, sinus pressure,  sneezing, sore throat and tinnitus.   Eyes: Negative.   Respiratory: Negative.   Cardiovascular: Negative.   Gastrointestinal: Negative.   Endocrine: Negative.   Genitourinary: Negative.   Musculoskeletal: Negative.   Skin: Negative.   Allergic/Immunologic: Negative.   Neurological: Negative.   Hematological: Negative.   Psychiatric/Behavioral: Negative.      Physical exam: BP 118/64 (BP Location: Right Arm, Patient Position: Sitting, Cuff Size: Normal)   Pulse 81   Temp 97.8 F (36.6 C) (Oral)   Ht 5\' 1"  (1.549 m)   Wt 121 lb 12.8 oz (55.2 kg)   SpO2 98%   BMI 23.01 kg/m     General:  Well-developed,well-nourished,in no acute distress; alert,appropriate and cooperative throughout examination Head:  normocephalic and atraumatic.   Eyes:  vision grossly intact, PERRL Ears:  R ear normal and L ear normal externally, TMs clear bilaterally Nose:  no external deformity.   Mouth:  good dentition.   Neck:  No deformities, masses, or tenderness noted. Breasts:  No mass, nodules, thickening, tenderness, bulging, retraction, inflamation, nipple discharge or skin changes noted.   Lungs:  Normal respiratory effort, chest expands symmetrically. Lungs are clear to auscultation, no crackles or wheezes. Heart:  Normal rate and regular rhythm. S1 and S2 normal without gallop, murmur, click, rub or other extra sounds. Abdomen:  Bowel sounds positive,abdomen soft and non-tender without masses, organomegaly or hernias noted. Msk:  No deformity or scoliosis noted of thoracic or lumbar spine.   Extremities:  No clubbing, cyanosis, edema, or deformity noted with normal full range of motion of all joints.   Neurologic:  alert & oriented X3 and gait normal.   Skin:  Intact without suspicious lesions or rashes Cervical Nodes:  No lymphadenopathy noted Axillary Nodes:  No palpable lymphadenopathy Psych:  Cognition and judgment appear intact. Alert and cooperative with normal attention span and  concentration. No apparent delusions, illusions, hallucinations

## 2017-05-06 NOTE — Patient Instructions (Signed)
Great to see you! Happy birthday! 

## 2017-05-06 NOTE — Assessment & Plan Note (Signed)
Reviewed preventive care protocols, scheduled due services, and updated immunizations Discussed nutrition, exercise, diet, and healthy lifestyle.  Orders Placed This Encounter  Procedures  . CBC with Differential/Platelet  . Lipid panel  . TSH  . Comprehensive metabolic panel

## 2017-05-13 ENCOUNTER — Other Ambulatory Visit: Payer: Self-pay | Admitting: Plastic Surgery

## 2017-05-13 HISTORY — PX: REDUCTION MAMMAPLASTY: SUR839

## 2017-06-17 ENCOUNTER — Ambulatory Visit (AMBULATORY_SURGERY_CENTER): Payer: Self-pay | Admitting: *Deleted

## 2017-06-17 ENCOUNTER — Other Ambulatory Visit: Payer: Self-pay

## 2017-06-17 VITALS — Ht 60.5 in | Wt 122.0 lb

## 2017-06-17 DIAGNOSIS — Z1211 Encounter for screening for malignant neoplasm of colon: Secondary | ICD-10-CM

## 2017-06-17 NOTE — Progress Notes (Signed)
Patient denies any allergies to eggs or soy. Patient denies any problems with anesthesia/sedation. Patient denies any oxygen use at home. Patient denies taking any diet/weight loss medications or blood thinners. EMMI education assisgned to patient on colonoscopy, this was explained and instructions given to patient. 

## 2017-06-30 ENCOUNTER — Encounter: Payer: Self-pay | Admitting: Internal Medicine

## 2017-07-01 ENCOUNTER — Encounter: Payer: BC Managed Care – PPO | Admitting: Internal Medicine

## 2017-08-18 ENCOUNTER — Encounter: Payer: Self-pay | Admitting: Family Medicine

## 2017-08-19 NOTE — Telephone Encounter (Signed)
It sounds like patient has a dermatologist they follow. They can call to schedule there yearly appointment. No referral is necessary for there insurance, but for insurance purposes we can have a referral on file and just find out the name of the dermatologist or office and we can put it in the referral. Please let me know if I can be of any further assistance.

## 2017-08-20 ENCOUNTER — Other Ambulatory Visit: Payer: Self-pay

## 2017-08-20 MED ORDER — ALPRAZOLAM 0.25 MG PO TABS: ORAL_TABLET | ORAL | 2 refills | Status: DC

## 2017-08-20 MED ORDER — TRETINOIN 0.025 % EX CREA: TOPICAL_CREAM | Freq: Every day | CUTANEOUS | 1 refills | Status: DC

## 2018-02-16 ENCOUNTER — Ambulatory Visit (INDEPENDENT_AMBULATORY_CARE_PROVIDER_SITE_OTHER): Payer: BC Managed Care – PPO | Admitting: Family Medicine

## 2018-02-16 ENCOUNTER — Encounter: Payer: Self-pay | Admitting: Family Medicine

## 2018-02-16 ENCOUNTER — Ambulatory Visit (INDEPENDENT_AMBULATORY_CARE_PROVIDER_SITE_OTHER): Payer: BC Managed Care – PPO

## 2018-02-16 VITALS — BP 130/84 | HR 66 | Temp 97.8°F | Ht 61.0 in | Wt 118.8 lb

## 2018-02-16 DIAGNOSIS — R51 Headache: Secondary | ICD-10-CM | POA: Diagnosis not present

## 2018-02-16 DIAGNOSIS — R519 Headache, unspecified: Secondary | ICD-10-CM | POA: Insufficient documentation

## 2018-02-16 MED ORDER — ALPRAZOLAM 0.25 MG PO TABS: ORAL_TABLET | ORAL | 2 refills | Status: DC

## 2018-02-16 NOTE — Patient Instructions (Signed)
Great to see you.  Take 1-2 Alleve twice daily with food for the next 1- 2 weeks.  I will call you with your xray results.  Occipital Neuralgia Occipital neuralgia is a type of headache that causes episodes of very bad pain in the back of your head. Pain from occipital neuralgia may spread (radiate) to other parts of your head. The pain is usually brief and often goes away after you rest and relax. These headaches may be caused by irritation of the nerves that leave your spinal cord high up in your neck, just below the base of your skull (occipital nerves). Your occipital nerves transmit sensations from the back of your head, the top of your head, and the areas behind your ears. What are the causes? Occipital neuralgia can occur without any known cause (primary headache syndrome). In other cases, occipital neuralgia is caused by pressure on or irritation of one of the two occipital nerves. Causes of occipital nerve compression or irritation include:  Wear and tear of the vertebrae in the neck (osteoarthritis).  Neck injury.  Disease of the disks that separate the vertebrae.  Tumors.  Gout.  Infections.  Diabetes.  Swollen blood vessels that put pressure on the occipital nerves.  Muscle spasm in the neck.  What are the signs or symptoms? Pain is the main symptom of occipital neuralgia. It usually starts in the back of the head but may also be felt in other areas supplied by the occipital nerves. Pain is usually on one side but may be on both sides. You may have:  Brief episodes of very bad pain that is burning, stabbing, shocking, or shooting.  Pain behind the eye.  Pain triggered by neck movement or hair brushing.  Scalp tenderness.  Aching in the back of the head between episodes of very bad pain.  How is this diagnosed? Your health care provider may diagnose occipital neuralgia based on your symptoms and a physical exam. During the exam, the health care provider may push  on areas supplied by the occipital nerves to see if they are painful. Some tests may also be done to help in making the diagnosis. These may include:  Imaging studies of the upper spinal cord, such as an MRI or CT scan. These may show compression or spinal cord abnormalities.  Nerve block. You will get an injection of numbing medicine (local anesthetic) near the occipital nerve to see if this relieves pain.  How is this treated? Treatment may begin with simple measures, such as:  Rest.  Massage.  Heat.  Over-the-counter pain relievers.  If these measures do not work, you may need other treatments, including:  Medicines such as: ? Prescription-strength anti-inflammatory medicines. ? Muscle relaxants. ? Antiseizure medicines. ? Antidepressants.  Steroid injection. This involves injections of local anesthetic and strong anti-inflammatory drugs (steroids).  Pulsed radiofrequency. Wires are implanted to deliver electrical impulses that block pain signals from the occipital nerve.  Physical therapy.  Surgery to relieve nerve pressure.  Follow these instructions at home:  Take all medicines as directed by your health care provider.  Avoid activities that cause pain.  Rest when you have an attack of pain.  Try gentle massage or a heating pad to relieve pain.  Work with a physical therapist to learn stretching exercises you can do at home.  Try a different pillow or sleeping position.  Practice good posture.  Try to stay active. Get regular exercise that does not cause pain. Ask your health care  provider to suggest safe exercises for you.  Keep all follow-up visits as directed by your health care provider. This is important. Contact a health care provider if:  Your medicine is not working.  You have new or worsening symptoms. Get help right away if:  You have very bad head pain that is not going away.  You have a sudden change in vision, balance, or speech. This  information is not intended to replace advice given to you by your health care provider. Make sure you discuss any questions you have with your health care provider. Document Released: 07/02/2001 Document Revised: 12/14/2015 Document Reviewed: 06/30/2013 Elsevier Interactive Patient Education  2017 [employer].

## 2018-02-16 NOTE — Progress Notes (Signed)
Subjective:   Patient ID: Patricia Hampton, female    DOB: 22-Sep-1950, 67 y.o.   MRN: [id number]  Patricia Hampton is a pleasant 67 y.o. year old female who presents to clinic today with Headache (Patient is here today C/O a headache.  She states that it started 3-4 weeks ago in the back right side of head and is intermittent.  Does not radiate. Feels like her face is tense.  Denies any light or sound sensitivity or N&V.  Is requesting a refill of Alprazolam.)  on 02/16/2018  HPI:  Right occipital headache- intermittent but daily.  6/10 of pain at the worst. Ibuprofen and Excedrin have helped some. No associated nausea, vomiting or photophobia. No neck pain.  No UE weakness, she does have times have some right UE achking.    Current Outpatient Medications on File Prior to Visit  Medication Sig Dispense Refill  . Biotin 1000 MCG tablet Take 1 tablet (1 mg total) by mouth daily. 180 tablet 1  . Calcium Carbonate-Vitamin D (CALCIUM 600/VITAMIN D) 600-400 MG-UNIT per tablet Take 1 tablet by mouth 2 (two) times daily. 180 tablet 3  . Cholecalciferol ([employer] VITAMIN D3) 1000 UNITS capsule TAKE 1 TABLET BY MOUTH 2 (TWO) TIMES DAILY. 180 capsule 1  . fish oil-omega-3 fatty acids 1000 MG capsule Take 2 g by mouth daily.    . rosuvastatin (CRESTOR) 10 MG tablet TAKE 1 TABLET (10 MG TOTAL) BY MOUTH DAILY. 90 tablet 3  . tretinoin (RETIN-A) 0.025 % cream Apply topically at bedtime. 45 g 1   No current facility-administered medications on file prior to visit.     No Known Allergies  Past Medical History:  Diagnosis Date  . Anxiety   . Hyperlipidemia   . Osteopenia 02/19/2005  . Urinary incontinence     Past Surgical History:  Procedure Laterality Date  . bladder tack    . BREAST EXCISIONAL BIOPSY Left   . CESAREAN SECTION    . COLONOSCOPY  08/2006  . REDUCTION MAMMAPLASTY Bilateral 05/13/2017  . REPEAT CESAREAN SECTION      Family History  Problem Relation Age of Onset  .  Hyperlipidemia Mother   . Stroke Father   . Hypertension Sister   . Depression Sister   . Alcohol abuse Sister   . Stroke Sister   . Hyperlipidemia Brother   . Cancer Brother        ESOPHAGEAL  . Cancer Maternal Grandmother        breast  . Breast cancer Maternal Grandmother   . Diabetes Paternal Grandfather   . Diabetes Brother   . Hypertension Brother   . Depression Brother   . Hyperlipidemia Brother   . Obesity Sister   . Colon cancer Neg Hx     Social History   Socioeconomic History  . Marital status: Married    Spouse name: Not on file  . Number of children: 2  . Years of education: Not on file  . Highest education level: Not on file  Occupational History    Employer: [employer]  Social Needs  . Financial resource strain: Not on file  . Food insecurity:    Worry: Not on file    Inability: Not on file  . Transportation needs:    Medical: Not on file    Non-medical: Not on file  Tobacco Use  . Smoking status: Never Smoker  . Smokeless tobacco: Never Used  Substance and Sexual Activity  . Alcohol use:  Yes    Alcohol/week: 6.0 oz    Types: 10 Glasses of wine per week    Comment: 8-10 glasses wine per week per pt  . Drug use: No  . Sexual activity: Yes  Lifestyle  . Physical activity:    Days per week: Not on file    Minutes per session: Not on file  . Stress: Not on file  Relationships  . Social connections:    Talks on phone: Not on file    Gets together: Not on file    Attends religious service: Not on file    Active member of club or organization: Not on file    Attends meetings of clubs or organizations: Not on file    Relationship status: Not on file  . Intimate partner violence:    Fear of current or ex partner: Not on file    Emotionally abused: Not on file    Physically abused: Not on file    Forced sexual activity: Not on file  Other Topics Concern  . Not on file  Social History Narrative  . Not on file   The PMH, PSH,  Social History, Family History, Medications, and allergies have been reviewed in [facility], and have been updated if relevant.   Review of Systems  HENT: Negative.   Gastrointestinal: Negative.   Neurological: Positive for headaches. Negative for dizziness, tremors, seizures, syncope, facial asymmetry, speech difficulty, weakness, light-headedness and numbness.  All other systems reviewed and are negative.      Objective:    BP 130/84 (BP Location: Left Arm, Patient Position: Sitting, Cuff Size: Normal)   Pulse 66   Temp 97.8 F (36.6 C) (Oral)   Ht 5\' 1"  (1.549 m)   Wt 118 lb 12.8 oz (53.9 kg)   SpO2 96%   BMI 22.45 kg/m    Physical Exam  Constitutional: She is oriented to person, place, and time. She appears well-developed and well-nourished.  Non-toxic appearance. She does not appear ill.  HENT:  Head: Normocephalic and atraumatic.  Eyes: EOM are normal.  Neck: Normal range of motion.  Cardiovascular: Normal rate.  Pulmonary/Chest: Effort normal.  Neurological: She is alert and oriented to person, place, and time. She has normal strength. She is not disoriented. She displays normal reflexes. No cranial nerve deficit. She displays a negative Romberg sign.  Psychiatric: She has a normal mood and affect. Her behavior is normal. Her mood appears not anxious. She is not agitated.  Nursing note and vitals reviewed.         Assessment & Plan:   Right-sided headache - Plan: DG Cervical Spine Complete, DG Cervical Spine Complete No follow-ups on file.

## 2018-02-16 NOTE — Assessment & Plan Note (Signed)
New- ? Nerve impingement/occipital neuralgia. Discussed supportive care- see AVS for instructions. Will order neck xray today to evaluate for DDD.  No red flag symptoms or signs on exam. The patient indicates understanding of these issues and agrees with the plan.

## 2018-04-12 ENCOUNTER — Other Ambulatory Visit: Payer: Self-pay | Admitting: Family Medicine

## 2018-05-13 ENCOUNTER — Encounter: Payer: Self-pay | Admitting: Family Medicine

## 2018-05-14 ENCOUNTER — Other Ambulatory Visit: Payer: Self-pay | Admitting: Family Medicine

## 2018-05-15 ENCOUNTER — Ambulatory Visit (INDEPENDENT_AMBULATORY_CARE_PROVIDER_SITE_OTHER): Payer: Medicare Other

## 2018-05-15 ENCOUNTER — Telehealth: Payer: Self-pay | Admitting: Family Medicine

## 2018-05-15 ENCOUNTER — Encounter: Payer: Self-pay | Admitting: Nurse Practitioner

## 2018-05-15 ENCOUNTER — Ambulatory Visit (INDEPENDENT_AMBULATORY_CARE_PROVIDER_SITE_OTHER): Payer: Medicare Other | Admitting: Nurse Practitioner

## 2018-05-15 VITALS — BP 118/80 | HR 78 | Temp 98.1°F | Ht 61.0 in | Wt 121.0 lb

## 2018-05-15 DIAGNOSIS — M7989 Other specified soft tissue disorders: Secondary | ICD-10-CM | POA: Diagnosis not present

## 2018-05-15 DIAGNOSIS — M79674 Pain in right toe(s): Secondary | ICD-10-CM

## 2018-05-15 NOTE — Progress Notes (Signed)
Subjective:  Patient ID: Patricia Hampton, female    DOB: Jul 30, 1950  Age: 67 y.o. MRN: [id number]  CC: Pain (pt is complaining of right foot pain,painful to move,swelling. going on this morning. denied of injury. )  Foot Injury   The incident occurred 1 to 3 hours ago. The incident occurred at home. The injury mechanism is unknown. The pain is present in the right foot. The quality of the pain is described as aching. The pain has been constant since onset. Associated symptoms include an inability to bear weight. Pertinent negatives include no loss of motion, loss of sensation, muscle weakness, numbness or tingling. The symptoms are aggravated by movement, palpation and weight bearing. She has tried nothing for the symptoms.  Hx of osteopenia, dexa scan done 04/2015.  Reviewed past Medical, Social and Family history today.  Outpatient Medications Prior to Visit  Medication Sig Dispense Refill  . ALPRAZolam (XANAX) 0.25 MG tablet TAKE 1 TO 2 TABLETS BY MOUTH EVERY 6 HOURS AS NEEDED FOR ANXIETY 30 tablet 2  . Biotin 1000 MCG tablet Take 1 tablet (1 mg total) by mouth daily. 180 tablet 1  . Calcium Carbonate-Vitamin D (CALCIUM 600/VITAMIN D) 600-400 MG-UNIT per tablet Take 1 tablet by mouth 2 (two) times daily. 180 tablet 3  . Cholecalciferol ([employer]S VITAMIN D3) 1000 UNITS capsule TAKE 1 TABLET BY MOUTH 2 (TWO) TIMES DAILY. 180 capsule 1  . fish oil-omega-3 fatty acids 1000 MG capsule Take 2 g by mouth daily.    . rosuvastatin (CRESTOR) 10 MG tablet TAKE 1 TABLET BY MOUTH ONCE DAILY 30 tablet 0  . tretinoin (RETIN-A) 0.025 % cream Apply topically at bedtime. 45 g 1   No facility-administered medications prior to visit.     ROS See HPI  Objective:  BP 118/80   Pulse 78   Temp 98.1 F (36.7 C) (Oral)   Ht 5\' 1"  (1.549 m)   Wt 121 lb (54.9 kg)   SpO2 97%   BMI 22.86 kg/m   BP Readings from Last 3 Encounters:  05/15/18 118/80  02/16/18 130/84  05/06/17 118/64    Wt Readings  from Last 3 Encounters:  05/15/18 121 lb (54.9 kg)  02/16/18 118 lb 12.8 oz (53.9 kg)  06/17/17 122 lb (55.3 kg)    Physical Exam  Constitutional: She appears well-developed and well-nourished.  Cardiovascular: Intact distal pulses.  Musculoskeletal: She exhibits tenderness.       Right ankle: She exhibits no ecchymosis and normal pulse. Tenderness. AITFL tenderness found. No lateral malleolus and no medial malleolus tenderness found. Achilles tendon normal.       Right lower leg: Normal.       Feet:  Skin: No erythema.  Vitals reviewed.   Lab Results  Component Value Date   WBC 6.0 05/06/2017   HGB 15.2 (H) 05/06/2017   HCT 44.2 05/06/2017   PLT 271.0 05/06/2017   GLUCOSE 95 05/06/2017   CHOL 212 (H) 05/06/2017   TRIG 66.0 05/06/2017   HDL 88.20 05/06/2017   LDLDIRECT 134.6 08/24/2013   LDLCALC 111 (H) 05/06/2017   ALT 21 05/06/2017   AST 19 05/06/2017   NA 143 05/06/2017   K 5.2 (H) 05/06/2017   CL 104 05/06/2017   CREATININE 0.73 05/06/2017   BUN 13 05/06/2017   CO2 30 05/06/2017   TSH 1.47 05/06/2017   INR 0.9 06/30/2007    Mm Diag Breast Tomo Bilateral  Result Date: 05/06/2017 CLINICAL DATA:  67 year old female scheduled for  reduction mammoplasty. EXAM: 2D DIGITAL DIAGNOSTIC BILATERAL MAMMOGRAM WITH CAD AND ADJUNCT TOMO COMPARISON:  Previous exam(s). ACR Breast Density Category b: There are scattered areas of fibroglandular density. FINDINGS: No suspicious mammographic findings are identified in either breast. Mammographic images were processed with CAD. IMPRESSION: No mammographic evidence of malignancy. RECOMMENDATION: Screening mammogram in one year.(Code:[id number]-01Y) I have discussed the findings and recommendations with the patient. Results were also provided in writing at the conclusion of the visit. If applicable, a reminder letter will be sent to the patient regarding the next appointment. BI-RADS CATEGORY  1: Negative. Electronically Signed   By: Kristopher Oppenheim  M.D.   On: 05/06/2017 14:04    Assessment & Plan:   Patricia Hampton was seen today for pain.  Diagnoses and all orders for this visit:  Pain and swelling of toe of right foot -     DG Foot Complete Right -     Apply wrap   I am having Patricia Hampton maintain her Calcium Carbonate-Vitamin D, fish oil-omega-3 fatty acids, Biotin, Cholecalciferol, tretinoin, rosuvastatin, and ALPRAZolam.  No orders of the defined types were placed in this encounter.   Follow-up: Return if symptoms worsen or fail to improve.  Wilfred Lacy, NP

## 2018-05-15 NOTE — Telephone Encounter (Signed)
Result given to the pt

## 2018-05-15 NOTE — Telephone Encounter (Signed)
Copied from [place] [id number]. Topic: Quick Communication - See Telephone Encounter >> May 15, 2018  1:56 PM Blase Mess A wrote: CRM for notification. See Telephone encounter for: 05/15/18. Patient is calling to get the results of her imaging today of her foot.  Patient call back number [phone]

## 2018-05-15 NOTE — Patient Instructions (Addendum)
arthritis changes in foot, but no fracture. Use ACE wrap or ankle brace daily and off at night. Rest, elevate and apply cold compress as discussed.  Use Ace wrap of ankle brace daily x 1-2weeks.  Call office if no improvement in 3days.  Ok to use naproxen 500mg  every 12hrs or Ibuprofen 600mg  every 8hrs as needed for pain (with food)  Foot Pain Many things can cause foot pain. Some common causes are:  An injury.  A sprain.  Arthritis.  Blisters.  Bunions.  Follow these instructions at home: Pay attention to any changes in your symptoms. Take these actions to help with your discomfort:  If directed, put ice on the affected area: ? Put ice in a plastic bag. ? Place a towel between your skin and the bag. ? Leave the ice on for 15-20 minutes, 3?4 times a day for 2 days.  Take over-the-counter and prescription medicines only as told by your health care provider.  Wear comfortable, supportive shoes that fit you well. Do not wear high heels.  Do not stand or walk for long periods of time.  Do not lift a lot of weight. This can put added pressure on your feet.  Do stretches to relieve foot pain and stiffness as told by your health care provider.  Rub your foot gently.  Keep your feet clean and dry.  Contact a health care provider if:  Your pain does not get better after a few days of self-care.  Your pain gets worse.  You cannot stand on your foot. Get help right away if:  Your foot is numb or tingling.  Your foot or toes are swollen.  Your foot or toes turn white or blue.  You have warmth and redness along your foot. This information is not intended to replace advice given to you by your health care provider. Make sure you discuss any questions you have with your health care provider. Document Released: 08/04/2015 Document Revised: 12/14/2015 Document Reviewed: 08/03/2014 Elsevier Interactive Patient Education  [employer].

## 2018-05-15 NOTE — Telephone Encounter (Signed)
Patient is aware that Rx was sent in yesterday.

## 2018-05-15 NOTE — Telephone Encounter (Signed)
Patient came in and request refill for Xanax. Please advise.

## 2018-05-21 ENCOUNTER — Other Ambulatory Visit: Payer: Self-pay

## 2018-05-21 MED ORDER — CHOLECALCIFEROL 25 MCG (1000 UT) PO CAPS: ORAL_CAPSULE | ORAL | 3 refills | Status: DC

## 2018-05-21 MED ORDER — BIOTIN 1000 MCG PO TABS: 1000.0000 ug | ORAL_TABLET | Freq: Every day | ORAL | 3 refills | Status: DC

## 2018-05-21 MED ORDER — FISH OIL 1000 MG PO CAPS: ORAL_CAPSULE | ORAL | 3 refills | Status: AC

## 2018-05-21 MED ORDER — CALCIUM CARBONATE-VITAMIN D 600-400 MG-UNIT PO TABS: 1.0000 | ORAL_TABLET | Freq: Two times a day (BID) | ORAL | 3 refills | Status: AC

## 2018-05-23 ENCOUNTER — Other Ambulatory Visit: Payer: Self-pay | Admitting: Family Medicine

## 2018-05-25 NOTE — Telephone Encounter (Signed)
Not on current medication list at this office/thx dmf

## 2018-05-26 ENCOUNTER — Other Ambulatory Visit: Payer: Self-pay | Admitting: Family Medicine

## 2018-06-02 ENCOUNTER — Encounter: Payer: Self-pay | Admitting: Family Medicine

## 2018-06-03 ENCOUNTER — Encounter: Payer: Self-pay | Admitting: Family Medicine

## 2018-06-04 NOTE — Telephone Encounter (Signed)
Spoke with patient-pain has not improved-has not been using ace wrap. Will try this until her appointment. Offered an appointment for this week,declined she is out of town. She would like to see Sports Medicine-appointment made for 06/09/18-with Dr. Raeford Razor. Patient aware to contact office if symptoms worsen.

## 2018-06-09 ENCOUNTER — Ambulatory Visit (INDEPENDENT_AMBULATORY_CARE_PROVIDER_SITE_OTHER): Payer: Medicare Other

## 2018-06-09 ENCOUNTER — Encounter: Payer: Self-pay | Admitting: Family Medicine

## 2018-06-09 ENCOUNTER — Ambulatory Visit (INDEPENDENT_AMBULATORY_CARE_PROVIDER_SITE_OTHER): Payer: Medicare Other | Admitting: Family Medicine

## 2018-06-09 ENCOUNTER — Other Ambulatory Visit: Payer: Self-pay | Admitting: *Deleted

## 2018-06-09 VITALS — BP 130/86 | HR 76 | Temp 98.0°F | Ht 61.0 in | Wt 122.0 lb

## 2018-06-09 DIAGNOSIS — M25572 Pain in left ankle and joints of left foot: Secondary | ICD-10-CM

## 2018-06-09 LAB — URIC ACID: Uric Acid, Serum: 3.1 mg/dL (ref 2.4–7.0)

## 2018-06-09 MED ORDER — ROSUVASTATIN CALCIUM 10 MG PO TABS: 10.0000 mg | ORAL_TABLET | Freq: Every day | ORAL | 1 refills | Status: DC

## 2018-06-09 NOTE — Patient Instructions (Signed)
Nice to meet you  I will call you with the results from today  Please try to take the vimovo for 5 days straight  Please follow up with me if your symptom return.

## 2018-06-09 NOTE — Progress Notes (Signed)
Patricia Hampton - 67 y.o. female MRN [id number]  Date of birth: 1950/08/15  SUBJECTIVE:  Including CC & ROS.  Chief Complaint  Patient presents with  . Ankle Pain    right ankle / top foot pain , x1 mo     Patricia Hampton is a 67 y.o. female that is presenting with right ankle pain.  The pain is occurring for the past month.  She is feeling it over the anterior lateral aspect of the ankle.  She denies any injury or trauma.  She has little to no pain today.  The pain was worse over the course of the weekend.  She has taken pictures that show a bulge over the lateral midfoot.  She is also having bruising on the dorsal aspect of her foot, the anterior aspect of her leg and the lateral aspect of the leg.  Has not taken any medications.  Has not been started on any new medications.  Denies any bruising anywhere else on her body..  Independent review of the right foot x-ray from 10/21 shows significant degenerative changes of the first MTP joint.   Review of Systems  Constitutional: Negative for fever.  HENT: Negative for congestion.   Respiratory: Negative for cough.   Cardiovascular: Negative for chest pain.  Gastrointestinal: Negative for abdominal pain.  Musculoskeletal: Negative for gait problem.  Skin: Negative for color change.  Neurological: Negative for weakness.  Hematological: Bruises/bleeds easily.  Psychiatric/Behavioral: Negative for agitation.    HISTORY: Past Medical, Surgical, Social, and Family History Reviewed & Updated per EMR.   Pertinent Historical Findings include:  Past Medical History:  Diagnosis Date  . Anxiety   . Hyperlipidemia   . Osteopenia 02/19/2005  . Urinary incontinence     Past Surgical History:  Procedure Laterality Date  . bladder tack    . BREAST EXCISIONAL BIOPSY Left   . CESAREAN SECTION    . COLONOSCOPY  08/2006  . REDUCTION MAMMAPLASTY Bilateral 05/13/2017  . REPEAT CESAREAN SECTION      No Known Allergies  Family History   Problem Relation Age of Onset  . Hyperlipidemia Mother   . Stroke Father   . Hypertension Sister   . Depression Sister   . Alcohol abuse Sister   . Stroke Sister   . Hyperlipidemia Brother   . Cancer Brother        ESOPHAGEAL  . Cancer Maternal Grandmother        breast  . Breast cancer Maternal Grandmother   . Diabetes Paternal Grandfather   . Diabetes Brother   . Hypertension Brother   . Depression Brother   . Hyperlipidemia Brother   . Obesity Sister   . Colon cancer Neg Hx      Social History   Socioeconomic History  . Marital status: Married    Spouse name: Not on file  . Number of children: 2  . Years of education: Not on file  . Highest education level: Not on file  Occupational History    Employer: [employer]  Social Needs  . Financial resource strain: Not on file  . Food insecurity:    Worry: Not on file    Inability: Not on file  . Transportation needs:    Medical: Not on file    Non-medical: Not on file  Tobacco Use  . Smoking status: Never Smoker  . Smokeless tobacco: Never Used  Substance and Sexual Activity  . Alcohol use: Yes    Alcohol/week: 10.0  standard drinks    Types: 10 Glasses of wine per week    Comment: 8-10 glasses wine per week per pt  . Drug use: No  . Sexual activity: Yes  Lifestyle  . Physical activity:    Days per week: Not on file    Minutes per session: Not on file  . Stress: Not on file  Relationships  . Social connections:    Talks on phone: Not on file    Gets together: Not on file    Attends religious service: Not on file    Active member of club or organization: Not on file    Attends meetings of clubs or organizations: Not on file    Relationship status: Not on file  . Intimate partner violence:    Fear of current or ex partner: Not on file    Emotionally abused: Not on file    Physically abused: Not on file    Forced sexual activity: Not on file  Other Topics Concern  . Not on file  Social  History Narrative  . Not on file     PHYSICAL EXAM:  VS: BP 130/86 (BP Location: Right Arm, Patient Position: Sitting, Cuff Size: Normal)   Pulse 76   Temp 98 F (36.7 C) (Oral)   Ht 5\' 1"  (1.549 m)   Wt 122 lb (55.3 kg)   SpO2 96%   BMI 23.05 kg/m  Physical Exam Gen: NAD, alert, cooperative with exam, well-appearing ENT: normal lips, normal nasal mucosa,  Eye: normal EOM, normal conjunctiva and lids CV:  no edema, +2 pedal pulses   Resp: no accessory muscle use, non-labored,  Skin: no rashes, no areas of induration  Neuro: normal tone, normal sensation to touch Psych:  normal insight, alert and oriented MSK:  Right ankle & Foot: No visible swelling Ecchymosis is apparent over the anterior lateral ankle, dorsal foot, and anterolateral aspect of the lower leg Significant hallux valgus Full in plantarflexion, dorsiflexion, inversion, and eversion of the foot; flexion and extension of the toes Strength: 5/5 in all directions. Sensation: intact Vascular: intact w/ dorsalis pedis & posterior tibialis pulses 2+ Stable lateral and medial ligaments Neurovascularly intact  Limited ultrasound: Right ankle:  Extensor digitorum longus as an effusion when it crosses over the ankle joint.  He does not seem to be an effusion associated with the lateral anterior ankle joint itself.  There is iso-hyperechoic debris within this effusion to suggest crystalline deposition.  Summary: Findings suggest a gouty change of the ankle joint and the extensor digitorum longus tendon sheath.  Ultrasound and interpretation by Clearance Coots, MD    ASSESSMENT & PLAN:   Acute left ankle pain Has a significant effusion along the extensor digitorum longus.  It appears to have crystalline structure within this on ultrasound.  Possible for gouty flare.  -Vimovo samples provided for 5 days.  Not currently having any pain. -Uric acid. -If no improvement or has an exacerbation would consider an MRI to  better evaluate.  Would consider x-ray of the ankle or tib-fib as well.

## 2018-06-10 ENCOUNTER — Telehealth: Payer: Self-pay | Admitting: Family Medicine

## 2018-06-10 DIAGNOSIS — M25572 Pain in left ankle and joints of left foot: Secondary | ICD-10-CM | POA: Insufficient documentation

## 2018-06-10 NOTE — Telephone Encounter (Signed)
Informed patient of results. Uric acid is normal. Still treating as gout flare. If occurs again would consider MRI.   Rosemarie Ax, MD [facility] 06/10/2018, 9:44 AM

## 2018-06-10 NOTE — Assessment & Plan Note (Addendum)
Has a significant effusion along the extensor digitorum longus.  It appears to have crystalline structure within this on ultrasound.  Possible for gouty flare.  -Vimovo samples provided for 5 days.  Not currently having any pain. -Uric acid. -If no improvement or has an exacerbation would consider an MRI to better evaluate.  Would consider x-ray of the ankle or tib-fib as well.

## 2018-06-13 ENCOUNTER — Encounter: Payer: Self-pay | Admitting: Family Medicine

## 2018-06-22 ENCOUNTER — Ambulatory Visit (INDEPENDENT_AMBULATORY_CARE_PROVIDER_SITE_OTHER): Payer: Medicare Other | Admitting: Family Medicine

## 2018-06-22 ENCOUNTER — Other Ambulatory Visit (HOSPITAL_COMMUNITY)
Admission: RE | Admit: 2018-06-22 | Discharge: 2018-06-22 | Disposition: A | Discharge status: Home / Self Care | Payer: Medicare Other | Source: Ambulatory Visit | Attending: Family Medicine | Admitting: Family Medicine

## 2018-06-22 VITALS — BP 132/86 | HR 77 | Temp 97.9°F | Ht 60.5 in | Wt 120.2 lb

## 2018-06-22 DIAGNOSIS — N898 Other specified noninflammatory disorders of vagina: Secondary | ICD-10-CM

## 2018-06-22 DIAGNOSIS — Z1239 Encounter for other screening for malignant neoplasm of breast: Secondary | ICD-10-CM | POA: Diagnosis not present

## 2018-06-22 DIAGNOSIS — Z01411 Encounter for gynecological examination (general) (routine) with abnormal findings: Secondary | ICD-10-CM | POA: Diagnosis not present

## 2018-06-22 DIAGNOSIS — M8580 Other specified disorders of bone density and structure, unspecified site: Secondary | ICD-10-CM

## 2018-06-22 DIAGNOSIS — Z01419 Encounter for gynecological examination (general) (routine) without abnormal findings: Secondary | ICD-10-CM

## 2018-06-22 DIAGNOSIS — M25571 Pain in right ankle and joints of right foot: Secondary | ICD-10-CM | POA: Diagnosis not present

## 2018-06-22 DIAGNOSIS — Z23 Encounter for immunization: Secondary | ICD-10-CM

## 2018-06-22 DIAGNOSIS — E2839 Other primary ovarian failure: Secondary | ICD-10-CM | POA: Diagnosis not present

## 2018-06-22 DIAGNOSIS — E785 Hyperlipidemia, unspecified: Secondary | ICD-10-CM

## 2018-06-22 DIAGNOSIS — Z1211 Encounter for screening for malignant neoplasm of colon: Secondary | ICD-10-CM | POA: Diagnosis not present

## 2018-06-22 LAB — LIPID PANEL
Cholesterol: 208 mg/dL — ABNORMAL HIGH (ref 0–200)
HDL: 97.2 mg/dL (ref >39.00)
LDL Cholesterol: 89 mg/dL (ref 0–99)
NonHDL: 110.59
TRIGLYCERIDES: 106 mg/dL (ref 0.0–149.0)
Total CHOL/HDL Ratio: 2
VLDL: 21.2 mg/dL (ref 0.0–40.0)

## 2018-06-22 LAB — COMPREHENSIVE METABOLIC PANEL
ALT: 26 U/L (ref 0–35)
AST: 20 U/L (ref 0–37)
Albumin: 4.6 g/dL (ref 3.5–5.2)
Alkaline Phosphatase: 48 U/L (ref 39–117)
BUN: 9 mg/dL (ref 6–23)
CO2: 30 mEq/L (ref 19–32)
CREATININE: 0.66 mg/dL (ref 0.40–1.20)
Calcium: 9.4 mg/dL (ref 8.4–10.5)
Chloride: 104 mEq/L (ref 96–112)
GFR: 95.49 mL/min (ref >60.00)
Glucose, Bld: 105 mg/dL — ABNORMAL HIGH (ref 70–99)
Potassium: 4.2 mEq/L (ref 3.5–5.1)
Sodium: 140 mEq/L (ref 135–145)
Total Bilirubin: 0.7 mg/dL (ref 0.2–1.2)
Total Protein: 7 g/dL (ref 6.0–8.3)

## 2018-06-22 LAB — CBC WITH DIFFERENTIAL/PLATELET
Basophils Absolute: 0 10*3/uL (ref 0.0–0.1)
Basophils Relative: 0.7 % (ref 0.0–3.0)
Eosinophils Absolute: 0.3 10*3/uL (ref 0.0–0.7)
Eosinophils Relative: 5.1 % — ABNORMAL HIGH (ref 0.0–5.0)
HEMATOCRIT: 43.8 % (ref 36.0–46.0)
HEMOGLOBIN: 15 g/dL (ref 12.0–15.0)
Lymphocytes Relative: 37.7 % (ref 12.0–46.0)
Lymphs Abs: 1.9 10*3/uL (ref 0.7–4.0)
MCHC: 34.1 g/dL (ref 30.0–36.0)
MCV: 98.7 fl (ref 78.0–100.0)
Monocytes Absolute: 0.4 10*3/uL (ref 0.1–1.0)
Monocytes Relative: 8.1 % (ref 3.0–12.0)
Neutro Abs: 2.5 10*3/uL (ref 1.4–7.7)
Neutrophils Relative %: 48.4 % (ref 43.0–77.0)
Platelets: 265 10*3/uL (ref 150.0–400.0)
RBC: 4.44 Mil/uL (ref 3.87–5.11)
RDW: 12.7 % (ref 11.5–15.5)
WBC: 5.1 10*3/uL (ref 4.0–10.5)

## 2018-06-22 LAB — URIC ACID: Uric Acid, Serum: 3.2 mg/dL (ref 2.4–7.0)

## 2018-06-22 LAB — VITAMIN D 25 HYDROXY (VIT D DEFICIENCY, FRACTURES): VITD: 29.33 ng/mL — AB (ref 30.00–100.00)

## 2018-06-22 LAB — TSH: TSH: 3.29 u[IU]/mL (ref 0.35–4.50)

## 2018-06-22 NOTE — Assessment & Plan Note (Signed)
Continue crestor, check labs today.

## 2018-06-22 NOTE — Patient Instructions (Addendum)
Great to see you. Happy Holidays! I will call you with your lab results from today and you can view them online.   Please call the breast center at [phone] to schedule your mammogram and DEXA.  I am referring you to [facility] for colonoscopy and for an MRI of your foot.

## 2018-06-22 NOTE — Assessment & Plan Note (Addendum)
DEXA ordered. Continue good calcium and vitamin d intake in diet and weight bearing exercise.  Follow up bone density in 2 years.

## 2018-06-22 NOTE — Assessment & Plan Note (Signed)
Reviewed preventive care protocols, scheduled due services, and updated immunizations Discussed nutrition, exercise, diet, and healthy lifestyle.  Mammogram, DEXA ordered- pt to schedule. Refer to GI for colonoscopy. prevnar 13 given today.

## 2018-06-22 NOTE — Assessment & Plan Note (Signed)
Persistent- now mostly on top of right foot. Will get MRI since she has already had [place] and Xray.  ? Stress fracture now with tendon tear/involement. The patient indicates understanding of these issues and agrees with the plan.

## 2018-06-22 NOTE — Progress Notes (Signed)
67 yo very pleasant female here for CPX/Pap and follow up of chronic medical conditions.   Well woman- menopausal for past 7 + years.   No h/o abnormal pap smears.  Denies any post menopausal bleeding. Last pap smear was normal and done by me on 02/22/15. Due for colonoscopy- she would like referral to  GI.   Last 3 d mammogram done 05/06/2017, a week or two before her breast reduction. Due for DEXA- last done on 05/16/15- osteopenia.  Health Maintenance  Topic Date Due  . PAP SMEAR  02/21/2018  . MAMMOGRAM  05/06/2018  . PNA vac Low Risk Adult (1 of 2 - PCV13) 05/09/2018  . COLONOSCOPY  12/03/2020  . TETANUS/TDAP  10/01/2026  . INFLUENZA VACCINE  Completed  . DEXA SCAN  Completed  . Hepatitis C Screening  Completed  . HIV Screening  Completed   Right ankle/foot  pain-  Intermittently since October. Saw Dr. Raeford Razor for this on 06/09/18.  Note reviewed.  ? Gouty flare. US showed a gouty change of the ankle joint and the extensor digitorum longus tendon sheath.  He suggested imaging if pain continues.  Uric acid was neg- 3.1.  Has a strong FH of strokes.  When pain initially started, she had taken a kick boxing class the day before and leg was bruised up to her knee.  Pain is mostly now at the top of her foot and occurring once a week.   Hyperlipidemia- well controlled on Crestor 10 mg daily.  Denies any myalgias.   Due for fasting labs. Lab Results  Component Value Date   CHOL 212 (H) 05/06/2017   HDL 88.20 05/06/2017   LDLCALC 111 (H) 05/06/2017   LDLDIRECT 134.6 08/24/2013   TRIG 66.0 05/06/2017   CHOLHDL 2 05/06/2017   Lab Results  Component Value Date   ALT 21 05/06/2017   AST 19 05/06/2017   ALKPHOS 64 05/06/2017   BILITOT 0.6 05/06/2017   Lab Results  Component Value Date   WBC 6.0 05/06/2017   HGB 15.2 (H) 05/06/2017   HCT 44.2 05/06/2017   MCV 97.9 05/06/2017   PLT 271.0 05/06/2017   Lab Results  Component Value Date   TSH 1.47 05/06/2017     . Patient Active Problem List   Diagnosis Date Noted  . Well woman exam with routine gynecological exam 06/22/2018  . Right-sided headache 02/16/2018  . Back problem 02/01/2016  . Osteopenia 04/25/2015  . History of postmenopausal HRT 04/25/2015  . FH: stroke 02/03/2014  . Family history of carotid artery stenosis 02/03/2014  . ALLERGIC RHINITIS 04/21/2008  . HLD (hyperlipidemia) 04/09/2007  . Diverticulosis of large intestine 08/22/2006  . OSTEOPENIA 02/19/2005   Past Medical History:  Diagnosis Date  . Anxiety   . Hyperlipidemia   . Osteopenia 02/19/2005  . Urinary incontinence    Past Surgical History:  Procedure Laterality Date  . bladder tack    . BREAST EXCISIONAL BIOPSY Left   . CESAREAN SECTION    . COLONOSCOPY  08/2006  . REDUCTION MAMMAPLASTY Bilateral 05/13/2017  . REPEAT CESAREAN SECTION     Social History   Tobacco Use  . Smoking status: Never Smoker  . Smokeless tobacco: Never Used  Substance Use Topics  . Alcohol use: Yes    Alcohol/week: 10.0 standard drinks    Types: 10 Glasses of wine per week    Comment: 8-10 glasses wine per week per pt  . Drug use: No   Family History  Problem  Relation Age of Onset  . Hyperlipidemia Mother   . Stroke Father   . Hypertension Sister   . Depression Sister   . Alcohol abuse Sister   . Stroke Sister   . Hyperlipidemia Brother   . Cancer Brother        ESOPHAGEAL  . Cancer Maternal Grandmother        breast  . Breast cancer Maternal Grandmother   . Diabetes Paternal Grandfather   . Diabetes Brother   . Hypertension Brother   . Depression Brother   . Hyperlipidemia Brother   . Obesity Sister   . Colon cancer Neg Hx    No Known Allergies Current Outpatient Medications on File Prior to Visit  Medication Sig Dispense Refill  . ALPRAZolam (XANAX) 0.25 MG tablet TAKE 1 TO 2 TABLETS BY MOUTH EVERY 6 HOURS AS NEEDED FOR ANXIETY 30 tablet 2  . Biotin 1000 MCG tablet Take 1 tablet (1 mg total) by mouth  daily. 90 tablet 3  . Calcium Carbonate-Vitamin D 600-400 MG-UNIT tablet Take 1 tablet by mouth 2 (two) times daily. 180 tablet 3  . Cholecalciferol ([employer]S VITAMIN D3) 1000 units capsule Take 1bid 180 capsule 3  . ciclopirox (PENLAC) 8 % solution APPLY TOPICALLY TO THE AFFECTED AREA EVERY DAY  1  . MIRVASO 0.33 % GEL APPLY TOPICALLY TO THE AFFECTED AREA EVERY DAY  2  . Omega-3 Fatty Acids (FISH OIL) 1000 MG CAPS Take 2qd 180 capsule 3  . omeprazole (PRILOSEC) 40 MG capsule TAKE 1 CAPSULE BY MOUTH ONCE DAILY 90 capsule 3  . rosuvastatin (CRESTOR) 10 MG tablet Take 1 tablet (10 mg total) by mouth daily. 90 tablet 1  . tretinoin (RETIN-A) 0.025 % cream Apply topically at bedtime. 45 g 1   No current facility-administered medications on file prior to visit.    The PMH, PSH, Social History, Family History, Medications, and allergies have been reviewed in [facility], and have been updated if relevant.  ROS: Review of Systems  Constitutional: Negative.   HENT: Negative for ear pain, mouth sores, nosebleeds, postnasal drip, rhinorrhea, sinus pressure, sneezing, sore throat and tinnitus.   Eyes: Negative.   Respiratory: Negative.   Cardiovascular: Negative.   Gastrointestinal: Negative.   Endocrine: Negative.   Genitourinary: Positive for vaginal discharge.  Musculoskeletal: Positive for arthralgias.  Skin: Negative.   Allergic/Immunologic: Negative.   Neurological: Negative.   Hematological: Negative.   Psychiatric/Behavioral: Negative.      Physical exam: BP 132/86 (BP Location: Left Arm, Patient Position: Sitting, Cuff Size: Normal)   Pulse 77   Temp 97.9 F (36.6 C) (Oral)   Ht 5' 0.5" (1.537 m)   Wt 120 lb 3.2 oz (54.5 kg)   SpO2 97%   BMI 23.09 kg/m     General:  Well-developed,well-nourished,in no acute distress; alert,appropriate and cooperative throughout examination Head:  normocephalic and atraumatic.   Eyes:  vision grossly intact, PERRL Ears:  R ear normal and L ear normal  externally, TMs clear bilaterally Nose:  no external deformity.   Mouth:  good dentition.   Neck:  No deformities, masses, or tenderness noted. Breasts:  No mass, nodules, thickening, tenderness, bulging, retraction, inflamation, nipple discharge or skin changes noted.   Lungs:  Normal respiratory effort, chest expands symmetrically. Lungs are clear to auscultation, no crackles or wheezes. Heart:  Normal rate and regular rhythm. S1 and S2 normal without gallop, murmur, click, rub or other extra sounds. Abdomen:  Bowel sounds positive,abdomen soft and non-tender without  masses, organomegaly or hernias noted. Rectal:  no external abnormalities.   Genitalia:  Pelvic Exam:        External: normal female genitalia without lesions or masses        Vagina: normal without lesions or masses        Cervix: normal without lesions or masses        Adnexa: normal bimanual exam without masses or fullness        Uterus: normal by palpation        Pap smear: performed Msk:  Right foot- TTP in several areas on lateral foot. Extremities:  No clubbing, cyanosis, edema, or deformity noted with normal full range of motion of all joints.   Neurologic:  alert & oriented X3 and gait normal.   Skin:  Intact without suspicious lesions or rashes Cervical Nodes:  No lymphadenopathy noted Axillary Nodes:  No palpable lymphadenopathy Psych:  Cognition and judgment appear intact. Alert and cooperative with normal attention span and concentration. No apparent delusions, illusions, hallucinations

## 2018-06-23 ENCOUNTER — Encounter: Payer: Self-pay | Admitting: Family Medicine

## 2018-06-24 LAB — CYTOLOGY - PAP
Bacterial vaginitis: NEGATIVE
Candida vaginitis: NEGATIVE
HPV: NOT DETECTED

## 2018-06-30 ENCOUNTER — Other Ambulatory Visit: Payer: Self-pay | Admitting: *Deleted

## 2018-06-30 MED ORDER — ALPRAZOLAM 0.25 MG PO TABS: ORAL_TABLET | ORAL | 2 refills | Status: DC

## 2018-06-30 NOTE — Telephone Encounter (Signed)
Please advise to if you want to send in different strength or alternate medication. She has been provided a temporary supply per her insurance.

## 2018-07-08 ENCOUNTER — Ambulatory Visit: Payer: Medicare Other

## 2018-07-09 ENCOUNTER — Ambulatory Visit
Admission: RE | Admit: 2018-07-09 | Discharge: 2018-07-09 | Disposition: A | Discharge status: Home / Self Care | Payer: Medicare Other | Source: Ambulatory Visit | Attending: Family Medicine | Admitting: Family Medicine

## 2018-07-09 DIAGNOSIS — M659 Synovitis and tenosynovitis, unspecified: Secondary | ICD-10-CM | POA: Insufficient documentation

## 2018-07-09 DIAGNOSIS — M25571 Pain in right ankle and joints of right foot: Secondary | ICD-10-CM | POA: Diagnosis present

## 2018-07-10 ENCOUNTER — Encounter: Payer: Self-pay | Admitting: Family Medicine

## 2018-07-20 ENCOUNTER — Telehealth: Payer: Self-pay

## 2018-07-20 DIAGNOSIS — M7750 Other enthesopathy of unspecified foot: Secondary | ICD-10-CM

## 2018-07-20 NOTE — Telephone Encounter (Signed)
Copied from [place] [id number]. Topic: Referral - Request for Referral >> Jul 20, 2018  3:54 PM Alfredia Ferguson R wrote: Has patient seen PCP for this complaint? Yes Referral for which specialty: Orthopedic Preferred provider/office: none specific Reason for referral: ankle / mri done was told need to see an orthopedic doctor

## 2018-07-23 NOTE — Addendum Note (Signed)
Addended byShawnie Pons on: 07/23/2018 10:16 AM   Modules accepted: Orders

## 2018-07-23 NOTE — Telephone Encounter (Signed)
Dr. Deborra Medina, pt sent message back requesting referral for orthopedic (doesn't seem like she has one--see MRI result). I placed referral for urgent, do you want [place] to do anything else? Please advise.

## 2018-07-29 ENCOUNTER — Other Ambulatory Visit: Payer: Self-pay | Admitting: Radiology

## 2018-07-29 ENCOUNTER — Ambulatory Visit
Admission: RE | Admit: 2018-07-29 | Discharge: 2018-07-29 | Disposition: A | Discharge status: Home / Self Care | Payer: Medicare Other | Source: Ambulatory Visit | Attending: Family Medicine | Admitting: Family Medicine

## 2018-07-29 DIAGNOSIS — E2839 Other primary ovarian failure: Secondary | ICD-10-CM

## 2018-07-29 DIAGNOSIS — M8580 Other specified disorders of bone density and structure, unspecified site: Secondary | ICD-10-CM

## 2018-07-31 ENCOUNTER — Telehealth: Payer: Self-pay

## 2018-07-31 NOTE — Telephone Encounter (Signed)
Dr. Deborra Medina ,   Pt called and states that the office [facility] Fanti, MD) you referred her to was not any help. She feels that she does not have any confidence in seeing him. She states that she would like to find her own office to go to about this and is that ok with you?

## 2018-07-31 NOTE — Telephone Encounter (Signed)
I am so sorry she had a bad experience. Of course, she can go somewhere else.  Is there someone in particular she wants to see?  We can refer her to that office. Again, I am so sorry to hear she had a bad experience.

## 2018-08-03 NOTE — Telephone Encounter (Signed)
Spoke with pt and she will let us know when finds someone she will give [place] a call for [place] to send referral

## 2018-08-05 ENCOUNTER — Encounter: Payer: Self-pay | Admitting: Family Medicine

## 2018-08-06 ENCOUNTER — Encounter: Payer: Self-pay | Admitting: Nurse Practitioner

## 2018-08-09 ENCOUNTER — Encounter: Payer: Self-pay | Admitting: Family Medicine

## 2018-08-10 MED ORDER — OMEPRAZOLE 40 MG PO CPDR: 40.0000 mg | DELAYED_RELEASE_CAPSULE | Freq: Every day | ORAL | 3 refills | Status: DC

## 2018-08-12 ENCOUNTER — Telehealth: Payer: Self-pay

## 2018-08-12 ENCOUNTER — Encounter: Payer: Self-pay | Admitting: Family Medicine

## 2018-08-12 ENCOUNTER — Ambulatory Visit
Admission: RE | Admit: 2018-08-12 | Discharge: 2018-08-12 | Disposition: A | Discharge status: Home / Self Care | Payer: Medicare Other | Source: Ambulatory Visit | Attending: Family Medicine | Admitting: Family Medicine

## 2018-08-12 DIAGNOSIS — Z1239 Encounter for other screening for malignant neoplasm of breast: Secondary | ICD-10-CM

## 2018-08-12 NOTE — Telephone Encounter (Signed)
Copied from [place] [id number]. Topic: General - Other >> Aug 11, 2018  5:25 PM Valla Leaver wrote: Reason for CRM: Calling for results on ortho report. She is still having problems.

## 2018-08-13 NOTE — Telephone Encounter (Signed)
Spoke with pt and advised report has been sent in to our scan center. Once scanned in we will have access again to see it and review and she will as well through my chart

## 2018-09-02 ENCOUNTER — Encounter: Payer: Self-pay | Admitting: Family Medicine

## 2018-09-29 ENCOUNTER — Encounter: Payer: Self-pay | Admitting: Family Medicine

## 2018-09-29 DIAGNOSIS — Z1211 Encounter for screening for malignant neoplasm of colon: Secondary | ICD-10-CM

## 2018-09-30 MED ORDER — ALPRAZOLAM 0.25 MG PO TABS: 0.2500 mg | ORAL_TABLET | Freq: Three times a day (TID) | ORAL | 2 refills | Status: DC | PRN

## 2018-10-06 ENCOUNTER — Encounter: Payer: Self-pay | Admitting: Family Medicine

## 2018-10-09 LAB — COLOGUARD: Cologuard: POSITIVE — AB

## 2018-10-16 NOTE — Telephone Encounter (Signed)
Called Pt to give Cologuard results of positive and needs to be referred back to Dr. Silvano Rusk , G.I provider for further testing. Pt is aware.

## 2018-11-18 ENCOUNTER — Encounter: Payer: Self-pay | Admitting: Family Medicine

## 2018-11-19 ENCOUNTER — Encounter: Payer: Self-pay | Admitting: Family Medicine

## 2018-11-19 ENCOUNTER — Ambulatory Visit (INDEPENDENT_AMBULATORY_CARE_PROVIDER_SITE_OTHER): Payer: Medicare Other | Admitting: Family Medicine

## 2018-11-19 VITALS — BP 135/94 | HR 73 | Temp 98.6°F | Wt 119.0 lb

## 2018-11-19 DIAGNOSIS — Z823 Family history of stroke: Secondary | ICD-10-CM

## 2018-11-19 DIAGNOSIS — L659 Nonscarring hair loss, unspecified: Secondary | ICD-10-CM | POA: Insufficient documentation

## 2018-11-19 MED ORDER — SPIRONOLACTONE 100 MG PO TABS: ORAL_TABLET | ORAL | 1 refills | Status: DC

## 2018-11-19 NOTE — Assessment & Plan Note (Signed)
>  40 5 minutes spent in face to face time with patient, >50% spent in counselling or coordination of care.  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 The patient indicates understanding of these issues and agrees with the plan.

## 2018-11-19 NOTE — Progress Notes (Signed)
Virtual Visit via Video   Due to the COVID-19 pandemic, this visit was completed with telemedicine (audio/video) technology to reduce patient and provider exposure as well as to preserve personal protective equipment.   I connected with Patricia Hampton on 11/19/18 at 10:00 AM EDT by a video enabled telemedicine application and verified that I am speaking with the correct person using two identifiers. Location patient: Home Location provider: [facility], Office Persons participating in the virtual visit: Aundria Mems, MD   I discussed the limitations of evaluation and management by telemedicine and the availability of in person appointments. The patient expressed understanding and agreed to proceed.  Care Team   Patient Care Team: Lucille Passy, MD as PCP - General  Subjective:   HPI:  Hair loss-  C/O her hair thinning that she noticed about November 2019. She can now see her scalp more than before. Has to fix her hair in a way to make it less noticeable.  She sent [place] the following mychart message:  Dr. Deborra Medina, I have been having trouble with my hair for months. Even before Covid 19 concerns. I happened to mention this to my daughter who is a Designer, jewellery in a dermatologist office. She suggested I get in touch with you and ask about a prescription that could help. The name of it is Spironolactone 100mg ?  I hope is well with you and yours  Recent Results (from the past 2160 hour(s))  Cologuard     Status: Abnormal   Collection Time: 10/06/18 12:00 AM  Result Value Ref Range   Cologuard Positive (A) Negative   Lab Results  Component Value Date   TSH 3.29 06/22/2018   strong FH history of stoke. Review of Systems  HENT: Negative.   Eyes: Negative.   Respiratory: Negative.   Cardiovascular: Negative.   Gastrointestinal: Negative.   Genitourinary: Negative.   Musculoskeletal: Negative.   Skin: Negative.        + hair loss  All other systems  reviewed and are negative.    Patient Active Problem List   Diagnosis Date Noted   Hair loss 11/19/2018   Right ankle pain 06/22/2018   Right-sided headache 02/16/2018   Back problem 02/01/2016   Osteopenia 04/25/2015   History of postmenopausal HRT 04/25/2015   FH: stroke 02/03/2014   Family history of carotid artery stenosis 02/03/2014   ALLERGIC RHINITIS 04/21/2008   HLD (hyperlipidemia) 04/09/2007   Diverticulosis of large intestine 08/22/2006   OSTEOPENIA 02/19/2005    Social History   Tobacco Use   Smoking status: Never Smoker   Smokeless tobacco: Never Used  Substance Use Topics   Alcohol use: Yes    Alcohol/week: 10.0 standard drinks    Types: 10 Glasses of wine per week    Comment: 8-10 glasses wine per week per pt    Current Outpatient Medications:    ALPRAZolam (XANAX) 0.25 MG tablet, Take 1 tablet (0.25 mg total) by mouth 3 (three) times daily as needed for anxiety., Disp: 30 tablet, Rfl: 2   Biotin 1000 MCG tablet, Take 1 tablet (1 mg total) by mouth daily., Disp: 90 tablet, Rfl: 3   Calcium Carbonate-Vitamin D 600-400 MG-UNIT tablet, Take 1 tablet by mouth 2 (two) times daily., Disp: 180 tablet, Rfl: 3   Cholecalciferol (CVS VITAMIN D3) 1000 units capsule, Take 1bid, Disp: 180 capsule, Rfl: 3   Omega-3 Fatty Acids (FISH OIL) 1000 MG CAPS, Take 2qd, Disp: 180 capsule, Rfl: 3  omeprazole (PRILOSEC) 40 MG capsule, Take 1 capsule (40 mg total) by mouth daily., Disp: 90 capsule, Rfl: 3   rosuvastatin (CRESTOR) 10 MG tablet, Take 1 tablet (10 mg total) by mouth daily., Disp: 90 tablet, Rfl: 1   tretinoin (RETIN-A) 0.025 % cream, Apply topically at bedtime., Disp: 45 g, Rfl: 1  No Known Allergies  Objective:  Temp 98.6 F (37 C) (Oral)    Wt 119 lb (54 kg)    BMI 22.86 kg/m   VITALS: Per patient if applicable, see vitals. GENERAL: Alert, appears well and in no acute distress. HEENT: Atraumatic, conjunctiva clear, no obvious abnormalities  on inspection of external nose and ears. NECK: Normal movements of the head and neck. CARDIOPULMONARY: No increased WOB. Speaking in clear sentences. I:E ratio WNL.  MS: Moves all visible extremities without noticeable abnormality. PSYCH: Pleasant and cooperative, well-groomed. Speech normal rate and rhythm. Affect is appropriate. Insight and judgement are appropriate. Attention is focused, linear, and appropriate.  NEURO: CN grossly intact. Oriented as arrived to appointment on time with no prompting. Moves both UE equally.  SKIN: No obvious lesions, wounds, erythema, or cyanosis noted on face or hands.  Depression screen [facility] 2/9 06/22/2018 05/06/2017 02/22/2015  Decreased Interest 0 0 0  Down, Depressed, Hopeless 0 0 0  PHQ - 2 Score 0 0 0    Assessment and Plan:   Patricia Hampton was seen today for hair/scalp problem.  Diagnoses and all orders for this visit:  Hair loss     COVID-19 Education:The signs and symptoms of COVID-19 were discussed with the patient and how to seek care for testing if needed. The importance of social distancing was discussed today.  Reviewed expectations re: course of current medical issues.  Discussed self-management of symptoms.  Outlined signs and symptoms indicating need for more acute intervention.  Patient verbalized understanding and all questions were answered.  Health Maintenance issues including appropriate healthy diet, exercise, and smoking avoidance were discussed with patient.  See orders for this visit as documented in the electronic medical record.  Arnette Norris, MD 11/19/2018  Records requested if needed. Time spent: 40 minutes, of which >50% was spent in obtaining information about her symptoms, reviewing her previous labs, evaluations, and treatments, counseling her about her condition (please see the discussed topics above), and developing a plan to further investigate it; she had a number of questions which I addressed.

## 2018-11-19 NOTE — Assessment & Plan Note (Signed)
Discussed to cut back on potassium rich foods- I have sent her a mychart message this this and to return in one month for CMET to repeat CMET.  Orders entered and she will call for a lab visit. The patient indicates understanding of these issues and agrees with the plan.

## 2018-11-23 ENCOUNTER — Encounter: Payer: Self-pay | Admitting: Family Medicine

## 2018-11-30 ENCOUNTER — Telehealth: Payer: Self-pay | Admitting: Family Medicine

## 2018-11-30 ENCOUNTER — Encounter: Payer: Self-pay | Admitting: Family Medicine

## 2018-11-30 ENCOUNTER — Other Ambulatory Visit: Payer: Self-pay

## 2018-11-30 DIAGNOSIS — L659 Nonscarring hair loss, unspecified: Secondary | ICD-10-CM

## 2018-11-30 DIAGNOSIS — M8580 Other specified disorders of bone density and structure, unspecified site: Secondary | ICD-10-CM

## 2018-11-30 DIAGNOSIS — E559 Vitamin D deficiency, unspecified: Secondary | ICD-10-CM

## 2018-11-30 NOTE — Telephone Encounter (Signed)
error 

## 2018-12-02 ENCOUNTER — Other Ambulatory Visit (INDEPENDENT_AMBULATORY_CARE_PROVIDER_SITE_OTHER): Payer: Medicare Other

## 2018-12-02 DIAGNOSIS — Z823 Family history of stroke: Secondary | ICD-10-CM | POA: Diagnosis not present

## 2018-12-02 DIAGNOSIS — M8580 Other specified disorders of bone density and structure, unspecified site: Secondary | ICD-10-CM | POA: Diagnosis not present

## 2018-12-02 DIAGNOSIS — E559 Vitamin D deficiency, unspecified: Secondary | ICD-10-CM | POA: Diagnosis not present

## 2018-12-02 DIAGNOSIS — L659 Nonscarring hair loss, unspecified: Secondary | ICD-10-CM | POA: Diagnosis not present

## 2018-12-02 LAB — COMPREHENSIVE METABOLIC PANEL
ALT: 25 U/L (ref 0–35)
AST: 21 U/L (ref 0–37)
Albumin: 4.9 g/dL (ref 3.5–5.2)
Alkaline Phosphatase: 54 U/L (ref 39–117)
BUN: 15 mg/dL (ref 6–23)
CO2: 33 mEq/L — ABNORMAL HIGH (ref 19–32)
Calcium: 9.9 mg/dL (ref 8.4–10.5)
Chloride: 99 mEq/L (ref 96–112)
Creatinine, Ser: 0.93 mg/dL (ref 0.40–1.20)
GFR: 60.4 mL/min (ref >60.00)
Glucose, Bld: 70 mg/dL (ref 70–99)
Potassium: 4.7 mEq/L (ref 3.5–5.1)
Sodium: 138 mEq/L (ref 135–145)
Total Bilirubin: 0.6 mg/dL (ref 0.2–1.2)
Total Protein: 7.2 g/dL (ref 6.0–8.3)

## 2018-12-02 LAB — T4, FREE: Free T4: 0.9 ng/dL (ref 0.60–1.60)

## 2018-12-02 LAB — VITAMIN D 25 HYDROXY (VIT D DEFICIENCY, FRACTURES): VITD: 46.65 ng/mL (ref 30.00–100.00)

## 2018-12-02 LAB — TSH: TSH: 1.42 u[IU]/mL (ref 0.35–4.50)

## 2018-12-02 LAB — VITAMIN B12: Vitamin B-12: 303 pg/mL (ref 211–911)

## 2018-12-02 NOTE — Progress Notes (Signed)
Patient and lab staff wore face masks during lab visit per COVID-19 protocol. - DMG

## 2018-12-03 LAB — T3: T3, Total: 85 ng/dL (ref 76–181)

## 2018-12-07 ENCOUNTER — Ambulatory Visit (INDEPENDENT_AMBULATORY_CARE_PROVIDER_SITE_OTHER): Payer: Medicare Other | Admitting: Family Medicine

## 2018-12-07 ENCOUNTER — Encounter: Payer: Self-pay | Admitting: Family Medicine

## 2018-12-07 VITALS — BP 111/75 | HR 90 | Wt 119.0 lb

## 2018-12-07 DIAGNOSIS — L659 Nonscarring hair loss, unspecified: Secondary | ICD-10-CM | POA: Diagnosis not present

## 2018-12-07 MED ORDER — ASPIRIN EC 81 MG PO TBEC: 81.0000 mg | DELAYED_RELEASE_TABLET | Freq: Every day | ORAL | Status: AC

## 2018-12-07 MED ORDER — SPIRONOLACTONE 100 MG PO TABS: ORAL_TABLET | ORAL | 1 refills | Status: DC

## 2018-12-07 NOTE — Progress Notes (Signed)
Virtual Visit via Video   Due to the COVID-19 pandemic, this visit was completed with telemedicine (audio/video) technology to reduce patient and provider exposure as well as to preserve personal protective equipment.   I connected with Patricia Hampton by a video enabled telemedicine application and verified that I am speaking with the correct person using two identifiers. Location patient: Home Location provider: [facility], Office Persons participating in the virtual visit: Aundria Mems, MD   I discussed the limitations of evaluation and management by telemedicine and the availability of in person appointments. The patient expressed understanding and agreed to proceed.  Care Team   Patient Care Team: Lucille Passy, MD as PCP - General  Subjective:   HPI:   Hair loss- hair loss is more on her crown.  Noticed it for at least 6 months but recently she could really see it.  Had labs drawn 5 days ago (12/02/18) and made this appointment to discuss results.  She has been taking spironolactone 100 mg daily for this for 1 week now.  Started out taking 50 mg daily for 2 weeks ago for 1 week and increased to 100 mg daily thereafter. Has not yet noticed a difference.  Has not tried anything else for this other than OTC biotin.  Recent Results (from the past 2160 hour(s))  Cologuard     Status: Abnormal   Collection Time: 10/06/18 12:00 AM  Result Value Ref Range   Cologuard Positive (A) Negative  Vitamin B12     Status: None   Collection Time: 12/02/18  9:50 AM  Result Value Ref Range   Vitamin B-12 303 211 - 911 pg/mL  T4, free     Status: None   Collection Time: 12/02/18  9:50 AM  Result Value Ref Range   Free T4 0.90 0.60 - 1.60 ng/dL    Comment: Specimens from patients who are undergoing biotin therapy and /or ingesting biotin supplements may contain high levels of biotin.  The higher biotin concentration in these specimens interferes with this Free T4  assay.  Specimens that contain high levels  of biotin may cause false high results for this Free T4 assay.  Please interpret results in light of the total clinical presentation of the patient.    T3     Status: None   Collection Time: 12/02/18  9:50 AM  Result Value Ref Range   T3, Total 85 76 - 181 ng/dL  VITAMIN D 25 Hydroxy (Vit-D Deficiency, Fractures)     Status: None   Collection Time: 12/02/18  9:50 AM  Result Value Ref Range   VITD 46.65 30.00 - 100.00 ng/mL  TSH     Status: None   Collection Time: 12/02/18  9:50 AM  Result Value Ref Range   TSH 1.42 0.35 - 4.50 uIU/mL  Comprehensive metabolic panel     Status: Abnormal   Collection Time: 12/02/18  9:50 AM  Result Value Ref Range   Sodium 138 135 - 145 mEq/L   Potassium 4.7 3.5 - 5.1 mEq/L   Chloride 99 96 - 112 mEq/L   CO2 33 (H) 19 - 32 mEq/L   Glucose, Bld 70 70 - 99 mg/dL   BUN 15 6 - 23 mg/dL   Creatinine, Ser 0.93 0.40 - 1.20 mg/dL   Total Bilirubin 0.6 0.2 - 1.2 mg/dL   Alkaline Phosphatase 54 39 - 117 U/L   AST 21 0 - 37 U/L   ALT 25 0 -  35 U/L   Total Protein 7.2 6.0 - 8.3 g/dL   Albumin 4.9 3.5 - 5.2 g/dL   Calcium 9.9 8.4 - 10.5 mg/dL   GFR 60.40 >60.00 mL/min     Review of Systems  Constitutional: Negative.   HENT: Negative.   Eyes: Negative.   Respiratory: Negative.   Cardiovascular: Negative.   Gastrointestinal: Negative.   Genitourinary: Negative.   Musculoskeletal: Negative.   Skin: Negative.   Neurological: Negative.   Endo/Heme/Allergies: Negative.   Psychiatric/Behavioral: Negative.   All other systems reviewed and are negative.    Patient Active Problem List   Diagnosis Date Noted   Hair loss 11/19/2018   Back problem 02/01/2016   Osteopenia 04/25/2015   History of postmenopausal HRT 04/25/2015   FH: stroke 02/03/2014   Family history of carotid artery stenosis 02/03/2014   ALLERGIC RHINITIS 04/21/2008   HLD (hyperlipidemia) 04/09/2007   Diverticulosis of large  intestine 08/22/2006   OSTEOPENIA 02/19/2005    Social History   Tobacco Use   Smoking status: Never Smoker   Smokeless tobacco: Never Used  Substance Use Topics   Alcohol use: Yes    Alcohol/week: 10.0 standard drinks    Types: 10 Glasses of wine per week    Comment: 8-10 glasses wine per week per pt    Current Outpatient Medications:    ALPRAZolam (XANAX) 0.25 MG tablet, Take 1 tablet (0.25 mg total) by mouth 3 (three) times daily as needed for anxiety., Disp: 30 tablet, Rfl: 2   Biotin 1000 MCG tablet, Take 1 tablet (1 mg total) by mouth daily., Disp: 90 tablet, Rfl: 3   Calcium Carbonate-Vitamin D 600-400 MG-UNIT tablet, Take 1 tablet by mouth 2 (two) times daily., Disp: 180 tablet, Rfl: 3   Cholecalciferol ([employer] VITAMIN D3) 1000 units capsule, Take 1bid, Disp: 180 capsule, Rfl: 3   Omega-3 Fatty Acids (FISH OIL) 1000 MG CAPS, Take 2qd, Disp: 180 capsule, Rfl: 3   omeprazole (PRILOSEC) 40 MG capsule, Take 1 capsule (40 mg total) by mouth daily., Disp: 90 capsule, Rfl: 3   rosuvastatin (CRESTOR) 10 MG tablet, Take 1 tablet (10 mg total) by mouth daily., Disp: 90 tablet, Rfl: 1   spironolactone (ALDACTONE) 100 MG tablet, Take 1qd, Disp: 90 tablet, Rfl: 1   tretinoin (RETIN-A) 0.025 % cream, Apply topically at bedtime., Disp: 45 g, Rfl: 1   aspirin EC 81 MG tablet, Take 1 tablet (81 mg total) by mouth daily., Disp: , Rfl:   No Known Allergies  Objective:  BP 111/75    Pulse 90    Wt 119 lb (54 kg)    BMI 22.86 kg/m   VITALS: Per patient if applicable, see vitals. GENERAL: Alert, appears well and in no acute distress. HEENT: Atraumatic, conjunctiva clear, no obvious abnormalities on inspection of external nose and ears. NECK: Normal movements of the head and neck. CARDIOPULMONARY: No increased WOB. Speaking in clear sentences. I:E ratio WNL.  MS: Moves all visible extremities without noticeable abnormality. PSYCH: Pleasant and cooperative, well-groomed. Speech  normal rate and rhythm. Affect is appropriate. Insight and judgement are appropriate. Attention is focused, linear, and appropriate.  NEURO: CN grossly intact. Oriented as arrived to appointment on time with no prompting. Moves both UE equally.  SKIN: No obvious lesions, wounds, erythema, or cyanosis noted on face or hands.  Depression screen [facility] 2/9 06/22/2018 05/06/2017 02/22/2015  Decreased Interest 0 0 0  Down, Depressed, Hopeless 0 0 0  PHQ - 2 Score 0 0 0  Assessment and Plan:   Bre was seen today for follow-up.  Diagnoses and all orders for this visit:  Hair loss  Other orders -     spironolactone (ALDACTONE) 100 MG tablet; Take 1qd -     aspirin EC 81 MG tablet; Take 1 tablet (81 mg total) by mouth daily.     COVID-19 Education: The signs and symptoms of COVID-19 were discussed with the patient and how to seek care for testing if needed. The importance of social distancing was discussed today.  Reviewed expectations re: course of current medical issues.  Discussed self-management of symptoms.  Outlined signs and symptoms indicating need for more acute intervention.  Patient verbalized understanding and all questions were answered.  Health Maintenance issues including appropriate healthy diet, exercise, and smoking avoidance were discussed with patient.  See orders for this visit as documented in the electronic medical record.  Arnette Norris, MD  Records requested if needed. Time spent: 25 minutes, of which >50% was spent in obtaining information about her symptoms, reviewing her previous labs, evaluations, and treatments, counseling her about her condition (please see the discussed topics above), and developing a plan to further investigate it; she had a number of questions which I addressed.

## 2018-12-07 NOTE — Assessment & Plan Note (Signed)
>  25 minutes spent in face to face time with patient, >50% spent in counselling or coordination of care discussing hair loss.  I advised that she continue spironolactone at current dose and in about 3 weeks, at topical rogaine as studies have shown that when used together, treatment is more effective. She will continue biotin and update me. Although her thyroid function was normal, her TSH did drop significantly from a few months ago, so I did advise she schedule a 3 month OV with me. The patient indicates understanding of these issues and agrees with the plan.  Lab Results  Component Value Date   TSH 1.42 12/02/2018   T3TOTAL 85 12/02/2018

## 2019-01-12 ENCOUNTER — Ambulatory Visit: Payer: Medicare Other | Admitting: Internal Medicine

## 2019-01-18 ENCOUNTER — Other Ambulatory Visit: Payer: Self-pay | Admitting: Family Medicine

## 2019-01-21 ENCOUNTER — Encounter: Payer: Self-pay | Admitting: Family Medicine

## 2019-01-26 ENCOUNTER — Encounter: Payer: Self-pay | Admitting: Family Medicine

## 2019-01-27 ENCOUNTER — Other Ambulatory Visit: Payer: Self-pay | Admitting: Critical Care Medicine

## 2019-01-27 ENCOUNTER — Ambulatory Visit (AMBULATORY_SURGERY_CENTER): Payer: Self-pay | Admitting: *Deleted

## 2019-01-27 ENCOUNTER — Other Ambulatory Visit: Payer: Self-pay

## 2019-01-27 VITALS — Ht 60.5 in | Wt 119.0 lb

## 2019-01-27 DIAGNOSIS — R195 Other fecal abnormalities: Secondary | ICD-10-CM

## 2019-01-27 DIAGNOSIS — Z20822 Contact with and (suspected) exposure to covid-19: Secondary | ICD-10-CM

## 2019-01-27 NOTE — Progress Notes (Signed)
Pt is having a COVID test today - Pt will call us if positive - she has cold s/s cough, tired - no fever- placed in book in room 50 to follow up with pt as well   No egg or soy allergy known to patient  No issues with past sedation with any surgeries  or procedures, no intubation problems  No diet pills per patient No home 02 use per patient  No blood thinners per patient  Pt denies issues with constipation  No A fib or A flutter  EMMI video sent to pt's e mail   Pt verified name, DOB, address and insurance during PV today. Pt mailed instruction packet to included paper to complete and mail back to [facility] with addressed and stamped envelope, Emmi video, copy of consent form to read and not return, and instructions. PV completed over the phone. Pt encouraged to call with questions or issues   Pt is aware that care partner will wait in the car during proceudre; if they feel like they will be too hot to wait in the car; they may wait in the lobby.  We want them to wear a mask (we do not have any that we can provide them), practice social distancing, and we will check their temperatures when they get here.  I did remind patient that their care partner needs to stay in the parking lot the entire time. Pt will wear mask into building.

## 2019-02-01 ENCOUNTER — Encounter: Payer: Self-pay | Admitting: Family Medicine

## 2019-02-01 LAB — NOVEL CORONAVIRUS, NAA: SARS-CoV-2, NAA: NOT DETECTED

## 2019-02-05 ENCOUNTER — Other Ambulatory Visit: Payer: Self-pay | Admitting: Family Medicine

## 2019-02-08 ENCOUNTER — Telehealth: Payer: Self-pay | Admitting: Internal Medicine

## 2019-02-08 NOTE — Telephone Encounter (Signed)
Patient called said her COVID test results came back negative.

## 2019-02-09 ENCOUNTER — Other Ambulatory Visit: Payer: Self-pay

## 2019-02-09 ENCOUNTER — Telehealth: Payer: Self-pay | Admitting: Internal Medicine

## 2019-02-09 MED ORDER — SPIRONOLACTONE 100 MG PO TABS: ORAL_TABLET | ORAL | 1 refills | Status: DC

## 2019-02-09 NOTE — Telephone Encounter (Signed)

## 2019-02-10 ENCOUNTER — Ambulatory Visit (AMBULATORY_SURGERY_CENTER): Payer: Medicare Other | Admitting: Internal Medicine

## 2019-02-10 ENCOUNTER — Encounter: Payer: Self-pay | Admitting: Internal Medicine

## 2019-02-10 ENCOUNTER — Other Ambulatory Visit: Payer: Self-pay

## 2019-02-10 VITALS — BP 144/82 | HR 63 | Temp 97.6°F | Resp 13 | Ht 60.0 in | Wt 119.0 lb

## 2019-02-10 DIAGNOSIS — D128 Benign neoplasm of rectum: Secondary | ICD-10-CM

## 2019-02-10 DIAGNOSIS — D123 Benign neoplasm of transverse colon: Secondary | ICD-10-CM | POA: Diagnosis not present

## 2019-02-10 DIAGNOSIS — R195 Other fecal abnormalities: Secondary | ICD-10-CM

## 2019-02-10 DIAGNOSIS — D124 Benign neoplasm of descending colon: Secondary | ICD-10-CM

## 2019-02-10 MED ORDER — SODIUM CHLORIDE 0.9 % IV SOLN: 500.0000 mL | Freq: Once | INTRAVENOUS | Status: DC

## 2019-02-10 NOTE — Progress Notes (Signed)
Pt's states no medical or surgical changes since previsit or office visit.  Temp taken by CW VS taken by JB 

## 2019-02-10 NOTE — Op Note (Signed)
[place] Patient Name: Patricia Hampton Procedure Date: 02/10/2019 11:36 AM MRN: [id number] Endoscopist: Gatha Mayer , MD Age: 68 Referring MD:  Date of Birth: [birth year] Gender: Female Account #: [id number] Procedure:                Colonoscopy Indications:              Positive Cologuard test Medicines:                Propofol per Anesthesia, Monitored Anesthesia Care Procedure:                Pre-Anesthesia Assessment:                           - Prior to the procedure, a History and Physical                            was performed, and patient medications and                            allergies were reviewed. The patient's tolerance of                            previous anesthesia was also reviewed. The risks                            and benefits of the procedure and the sedation                            options and risks were discussed with the patient.                            All questions were answered, and informed consent                            was obtained. Prior Anticoagulants: The patient has                            taken no previous anticoagulant or antiplatelet                            agents. ASA Grade Assessment: II - A patient with                            mild systemic disease. After reviewing the risks                            and benefits, the patient was deemed in                            satisfactory condition to undergo the procedure.                           After obtaining informed consent, the colonoscope  was passed under direct vision. Throughout the                            procedure, the patient's blood pressure, pulse, and                            oxygen saturations were monitored continuously. The                            Colonoscope was introduced through the anus and                            advanced to the the cecum, identified by                            appendiceal  orifice and ileocecal valve. The                            colonoscopy was performed without difficulty. The                            patient tolerated the procedure well. The quality                            of the bowel preparation was good. The ileocecal                            valve, appendiceal orifice, and rectum were                            photographed. The bowel preparation used was                            Miralax via split dose instruction. Scope In: 11:43:15 AM Scope Out: 11:59:07 AM Scope Withdrawal Time: 0 hours 13 minutes 49 seconds  Total Procedure Duration: 0 hours 15 minutes 52 seconds  Findings:                 The perianal and digital rectal examinations were                            normal.                           Four sessile polyps were found in the rectum,                            descending colon and transverse colon. The polyps                            were diminutive in size. These polyps were removed                            with a cold snare. Resection and retrieval were  complete. Verification of patient identification                            for the specimen was done. Estimated blood loss was                            minimal.                           Multiple diverticula were found in the sigmoid                            colon, descending colon and ascending colon.                           The exam was otherwise without abnormality on                            direct and retroflexion views. Complications:            No immediate complications. Estimated Blood Loss:     Estimated blood loss was minimal. Impression:               - Four diminutive polyps in the rectum, in the                            descending colon and in the transverse colon,                            removed with a cold snare. Resected and retrieved.                           - Diverticulosis in the sigmoid colon, in the                             descending colon and in the ascending colon.                           - The examination was otherwise normal on direct                            and retroflexion views. Recommendation:           - Patient has a contact number available for                            emergencies. The signs and symptoms of potential                            delayed complications were discussed with the                            patient. Return to normal activities tomorrow.                            Written discharge  instructions were provided to the                            patient.                           - Resume previous diet.                           - Continue present medications.                           - Repeat colonoscopy is recommended. The                            colonoscopy date will be determined after pathology                            results from today's exam become available for                            review. Gatha Mayer, MD 02/10/2019 12:09:09 PM This report has been signed electronically.

## 2019-02-10 NOTE — Patient Instructions (Addendum)
I found and removed 4 tiny polyps. All look benign.  No signs of cancer.  You also have a condition called diverticulosis - common and not usually a problem. Please read the handout provided.   I will let you know pathology results and when to have another routine colonoscopy by mail and/or My Chart.  I appreciate the opportunity to care for you. Gatha Mayer, MD, FACG    INFORMATION ON POLYPS AND DIVERTICULOSIS GIVEN TO YOU TODAY  AWAIT PATHOLGY RESULTS IN A LETTER FROM DR Carlean Purl PER MY CHART Allen Kell   YOU HAD AN ENDOSCOPIC PROCEDURE TODAY AT [facility]:   Refer to the procedure report that was given to you for any specific questions about what was found during the examination.  If the procedure report does not answer your questions, please call your gastroenterologist to clarify.  If you requested that your care partner not be given the details of your procedure findings, then the procedure report has been included in a sealed envelope for you to review at your convenience later.  YOU SHOULD EXPECT: Some feelings of bloating in the abdomen. Passage of more gas than usual.  Walking can help get rid of the air that was put into your GI tract during the procedure and reduce the bloating. If you had a lower endoscopy (such as a colonoscopy or flexible sigmoidoscopy) you may notice spotting of blood in your stool or on the toilet paper. If you underwent a bowel prep for your procedure, you may not have a normal bowel movement for a few days.  Please Note:  You might notice some irritation and congestion in your nose or some drainage.  This is from the oxygen used during your procedure.  There is no need for concern and it should clear up in a day or so.  SYMPTOMS TO REPORT IMMEDIATELY:   Following lower endoscopy (colonoscopy or flexible sigmoidoscopy):  Excessive amounts of blood in the stool  Significant tenderness or worsening of abdominal pains  Swelling  of the abdomen that is new, acute  Fever of 100F or higher    For urgent or emergent issues, a gastroenterologist can be reached at any hour by calling [phone].   DIET:  We do recommend a small meal at first, but then you may proceed to your regular diet.  Drink plenty of fluids but you should avoid alcoholic beverages for 24 hours.  ACTIVITY:  You should plan to take it easy for the rest of today and you should NOT DRIVE or use heavy machinery until tomorrow (because of the sedation medicines used during the test).    FOLLOW UP: Our staff will call the number listed on your records 48-72 hours following your procedure to check on you and address any questions or concerns that you may have regarding the information given to you following your procedure. If we do not reach you, we will leave a message.  We will attempt to reach you two times.  During this call, we will ask if you have developed any symptoms of COVID 19. If you develop any symptoms (ie: fever, flu-like symptoms, shortness of breath, cough etc.) before then, please call [phone].  If you test positive for Covid 19 in the 2 weeks post procedure, please call and report this information to [place].    If any biopsies were taken you will be contacted by phone or by letter within the next 1-3 weeks.  Please call us at [phone] if you have not heard about the biopsies in 3 weeks.    SIGNATURES/CONFIDENTIALITY: You and/or your care partner have signed paperwork which will be entered into your electronic medical record.  These signatures attest to the fact that that the information above on your After Visit Summary has been reviewed and is understood.  Full responsibility of the confidentiality of this discharge information lies with you and/or your care-partner.

## 2019-02-10 NOTE — Progress Notes (Signed)
Report given to PACU, vss 

## 2019-02-14 ENCOUNTER — Encounter: Payer: Self-pay | Admitting: Internal Medicine

## 2019-02-14 NOTE — Progress Notes (Signed)
3 dimin adenomas and 1 hyperplastic Recall 2025 My Chart

## 2019-03-02 ENCOUNTER — Encounter: Payer: Self-pay | Admitting: Family Medicine

## 2019-03-17 ENCOUNTER — Other Ambulatory Visit: Payer: Self-pay | Admitting: Family Medicine

## 2019-03-17 NOTE — Telephone Encounter (Signed)
Dr. Deborra Medina please advise last refill 6/29 #30 no refills

## 2019-04-05 ENCOUNTER — Encounter: Payer: Self-pay | Admitting: Family Medicine

## 2019-04-12 ENCOUNTER — Encounter: Payer: Self-pay | Admitting: Family Medicine

## 2019-04-13 ENCOUNTER — Other Ambulatory Visit: Payer: Self-pay

## 2019-04-13 MED ORDER — TRETINOIN 0.025 % EX CREA: TOPICAL_CREAM | Freq: Every day | CUTANEOUS | 11 refills | Status: DC

## 2019-05-03 ENCOUNTER — Telehealth: Payer: Self-pay | Admitting: Family Medicine

## 2019-05-03 NOTE — Telephone Encounter (Signed)
Patient is calling to schedule her welcome to medicare physical  Please advise Cb- [phone]

## 2019-05-04 ENCOUNTER — Telehealth: Payer: Self-pay | Admitting: Family Medicine

## 2019-05-04 ENCOUNTER — Encounter: Payer: Self-pay | Admitting: Family Medicine

## 2019-05-04 ENCOUNTER — Other Ambulatory Visit: Payer: Self-pay | Admitting: Family Medicine

## 2019-05-04 NOTE — Telephone Encounter (Signed)
Pt is going to see Glenard Haring for her welcome to medicare visit on 05/12/19 and wanted to know if she could have her cholesterol checked the same day

## 2019-05-04 NOTE — Telephone Encounter (Signed)
Done

## 2019-05-04 NOTE — Telephone Encounter (Signed)
Yes okay to check cholesterol on the same day.

## 2019-05-05 ENCOUNTER — Other Ambulatory Visit: Payer: Self-pay

## 2019-05-05 DIAGNOSIS — M79674 Pain in right toe(s): Secondary | ICD-10-CM

## 2019-05-05 DIAGNOSIS — E785 Hyperlipidemia, unspecified: Secondary | ICD-10-CM

## 2019-05-05 NOTE — Addendum Note (Signed)
Addended by: Darral Dash on: 05/05/2019 12:44 PM   Modules accepted: Orders

## 2019-05-05 NOTE — Telephone Encounter (Signed)
Pt informed. Lab placed same day of appointment

## 2019-05-06 MED ORDER — ROSUVASTATIN CALCIUM 10 MG PO TABS: 10.0000 mg | ORAL_TABLET | Freq: Every day | ORAL | 0 refills | Status: DC

## 2019-05-06 NOTE — Telephone Encounter (Signed)
Last fill 06/09/18  #90/1 Last OV 11/27/18

## 2019-05-12 ENCOUNTER — Other Ambulatory Visit: Payer: Medicare Other

## 2019-05-12 ENCOUNTER — Ambulatory Visit: Payer: Medicare Other | Admitting: *Deleted

## 2019-06-14 ENCOUNTER — Other Ambulatory Visit: Payer: Self-pay

## 2019-06-14 MED ORDER — ROSUVASTATIN CALCIUM 10 MG PO TABS: 10.0000 mg | ORAL_TABLET | Freq: Every day | ORAL | 0 refills | Status: DC

## 2019-06-14 NOTE — Telephone Encounter (Signed)
Last OV 12/07/18 Last fill 05/06/19  #30/0

## 2019-07-14 ENCOUNTER — Other Ambulatory Visit: Payer: Self-pay

## 2019-07-14 MED ORDER — ROSUVASTATIN CALCIUM 10 MG PO TABS: 10.0000 mg | ORAL_TABLET | Freq: Every day | ORAL | 0 refills | Status: DC

## 2019-07-14 NOTE — Telephone Encounter (Signed)
Last OV 12/07/18 Last fill 06/14/19  #30/0

## 2019-07-27 ENCOUNTER — Encounter: Payer: Self-pay | Admitting: Family Medicine

## 2019-07-27 NOTE — Telephone Encounter (Signed)
Last OV 12/07/18 Last fill 03/17/19  #30/0

## 2019-07-28 MED ORDER — ALPRAZOLAM 0.25 MG PO TABS: ORAL_TABLET | ORAL | 0 refills | Status: DC

## 2019-08-04 ENCOUNTER — Encounter: Payer: Self-pay | Admitting: Family Medicine

## 2019-08-05 ENCOUNTER — Encounter: Payer: Self-pay | Admitting: Family Medicine

## 2019-08-09 ENCOUNTER — Other Ambulatory Visit: Payer: Self-pay | Admitting: Family Medicine

## 2019-08-10 ENCOUNTER — Encounter: Payer: Self-pay | Admitting: Family Medicine

## 2019-08-10 ENCOUNTER — Ambulatory Visit: Payer: Medicare PPO | Attending: Internal Medicine

## 2019-08-10 DIAGNOSIS — Z23 Encounter for immunization: Secondary | ICD-10-CM | POA: Insufficient documentation

## 2019-08-10 NOTE — Progress Notes (Deleted)
Subjective:   Patricia Hampton is a 69 y.o. female who presents for an Initial Medicare Annual Wellness Visit.  The Patient was informed that the wellness visit is to identify future health risk and educate and initiate measures that can reduce risk for increased disease through the lifespan.   Describes health as fair, good or great?  Review of Systems    Home Safety/Smoke Alarms: Feels safe in home. Smoke alarms in place.     Female:      Mammo- 08/12/18      Dexa scan- 07/29/18       CCS-02/10/19. Recall 5 yrs.     Objective:    There were no vitals filed for this visit. There is no height or weight on file to calculate BMI.  No flowsheet data found.  Current Medications (verified) Outpatient Encounter Medications as of 08/11/2019  Medication Sig  . ALPRAZolam (XANAX) 0.25 MG tablet TAKE 1 TO 2 TABLETS BY MOUTH EVERY 6 HOURS AS NEEDED FOR ANXIETY  . aspirin EC 81 MG tablet Take 1 tablet (81 mg total) by mouth daily.  . Biotin 1000 MCG tablet Take 1 tablet (1 mg total) by mouth daily.  . Calcium Carbonate-Vitamin D 600-400 MG-UNIT tablet Take 1 tablet by mouth 2 (two) times daily.  . Cholecalciferol ([employer] VITAMIN D3) 1000 units capsule Take 1bid  . Omega-3 Fatty Acids (FISH OIL) 1000 MG CAPS Take 2qd  . omeprazole (PRILOSEC) 40 MG capsule TAKE 1 CAPSULE BY MOUTH EVERY DAY  . rosuvastatin (CRESTOR) 10 MG tablet Take 1 tablet (10 mg total) by mouth daily.  Marland Kitchen spironolactone (ALDACTONE) 100 MG tablet TAKE 1 TABLET BY MOUTH EVERY DAY  . tretinoin (RETIN-A) 0.025 % cream Apply topically at bedtime.   No facility-administered encounter medications on file as of 08/11/2019.    Allergies (verified) Patient has no known allergies.   History: Past Medical History:  Diagnosis Date  . Allergy   . Anxiety   . GERD (gastroesophageal reflux disease)   . Hyperlipidemia   . Osteopenia 02/19/2005  . Urinary incontinence    Past Surgical History:  Procedure Laterality Date  .  bladder tack    . BREAST EXCISIONAL BIOPSY Left   . CESAREAN SECTION    . COLONOSCOPY  08/2006  . REDUCTION MAMMAPLASTY Bilateral 05/13/2017  . REPEAT CESAREAN SECTION     Family History  Problem Relation Age of Onset  . Hyperlipidemia Mother   . Stroke Father   . Hypertension Sister   . Depression Sister   . Alcohol abuse Sister   . Stroke Sister   . Hyperlipidemia Brother   . Cancer Brother        ESOPHAGEAL  . Esophageal cancer Brother   . Cancer Maternal Grandmother        breast  . Breast cancer Maternal Grandmother   . Diabetes Paternal Grandfather   . Diabetes Brother   . Hypertension Brother   . Depression Brother   . Hyperlipidemia Brother   . Obesity Sister   . Colon cancer Neg Hx   . Colon polyps Neg Hx   . Rectal cancer Neg Hx   . Stomach cancer Neg Hx    Social History   Socioeconomic History  . Marital status: Married    Spouse name: Not on file  . Number of children: 2  . Years of education: Not on file  . Highest education level: Not on file  Occupational History    Employer: [employer]  Tobacco Use  . Smoking status: Never Smoker  . Smokeless tobacco: Never Used  Substance and Sexual Activity  . Alcohol use: Yes    Alcohol/week: 10.0 standard drinks    Types: 10 Glasses of wine per week    Comment: 8-10 glasses wine per week per pt  . Drug use: No  . Sexual activity: Yes  Other Topics Concern  . Not on file  Social History Narrative  . Not on file   Social Determinants of Health   Financial Resource Strain:   . Difficulty of Paying Living Expenses: Not on file  Food Insecurity:   . Worried About Charity fundraiser in the Last Year: Not on file  . Ran Out of Food in the Last Year: Not on file  Transportation Needs:   . Lack of Transportation (Medical): Not on file  . Lack of Transportation (Non-Medical): Not on file  Physical Activity:   . Days of Exercise per Week: Not on file  . Minutes of Exercise per Session: Not  on file  Stress:   . Feeling of Stress : Not on file  Social Connections:   . Frequency of Communication with Friends and Family: Not on file  . Frequency of Social Gatherings with Friends and Family: Not on file  . Attends Religious Services: Not on file  . Active Member of Clubs or Organizations: Not on file  . Attends Archivist Meetings: Not on file  . Marital Status: Not on file    Tobacco Counseling Counseling given: Not Answered   Clinical Intake:                        Activities of Daily Living No flowsheet data found.   Immunizations and Health Maintenance Immunization History  Administered Date(s) Administered  . Fluad Quad(high Dose 65+) 04/05/2019  . Influenza,inj,Quad PF,6+ Mos 04/25/2015, 03/15/2017, 04/28/2018  . Pneumococcal Conjugate-13 06/22/2018  . Rabies, IM 02/22/2012, 02/25/2012, 02/29/2012, 03/07/2012  . Rabies, intradermal 02/22/2012  . Td 01/16/2005  . Tdap 09/30/2016  . Zoster Recombinat (Shingrix) 04/28/2018, 07/21/2018   Health Maintenance Due  Topic Date Due  . PNA vac Low Risk Adult (2 of 2 - PPSV23) 06/23/2019    Patient Care Team: Lucille Passy, MD as PCP - General  Indicate any recent Medical Services you may have received from other than [facility] providers in the past year (date may be approximate).     Assessment:   This is a routine wellness examination for [place]. Physical assessment deferred to PCP.  Hearing/Vision screen No exam data present  Dietary issues and exercise activities discussed:   Diet (meal preparation, eat out, water intake, caffeinated beverages, dairy products, fruits and vegetables): {Desc; diets:16563} Breakfast: Lunch:  Dinner:      Goals   None    Depression Screen PHQ 2/9 Scores 06/22/2018 05/06/2017 02/22/2015  PHQ - 2 Score 0 0 0    Fall Risk Fall Risk  06/22/2018 05/06/2017  Falls in the past year? 0 No  Follow up Falls evaluation completed -    Cognitive  Function: Ad8 score reviewed for issues:  Issues making decisions:  Less interest in hobbies / activities:  Repeats questions, stories (family complaining):  Trouble using ordinary gadgets (microwave, computer, phone):  Forgets the month or year:   Mismanaging finances:   Remembering appts:  Daily problems with thinking and/or memory: Ad8 score is=  Screening Tests Health Maintenance  Topic Date Due  . PNA vac Low Risk Adult (2 of 2 - PPSV23) 06/23/2019  . MAMMOGRAM  08/13/2019  . COLONOSCOPY  02/10/2024  . TETANUS/TDAP  10/01/2026  . INFLUENZA VACCINE  Completed  . DEXA SCAN  Completed  . Hepatitis C Screening  Completed      Plan:   ***  I have personally reviewed and noted the following in the patient's chart:   . Medical and social history . Use of alcohol, tobacco or illicit drugs  . Current medications and supplements . Functional ability and status . Nutritional status . Physical activity . Advanced directives . List of other physicians . Hospitalizations, surgeries, and ER visits in previous 12 months . Vitals . Screenings to include cognitive, depression, and falls . Referrals and appointments  In addition, I have reviewed and discussed with patient certain preventive protocols, quality metrics, and best practice recommendations. A written personalized care plan for preventive services as well as general preventive health recommendations were provided to patient.     Shela Nevin, [place]   08/10/2019

## 2019-08-10 NOTE — Progress Notes (Signed)
   Covid-19 Vaccination Clinic  Name:  Patricia Hampton    MRN: [id number] DOB: 1951-01-04  08/10/2019  [name] was observed post Covid-19 immunization for 15 minutes without incidence. She was provided with Vaccine Information Sheet and instruction to access the V-Safe system.   [name] was instructed to call 911 with any severe reactions post vaccine: Marland Kitchen Difficulty breathing  . Swelling of your face and throat  . A fast heartbeat  . A bad rash all over your body  . Dizziness and weakness    Immunizations Administered    Name Date Dose VIS Date Route   Pfizer COVID-19 Vaccine 08/10/2019  4:34 PM 0.3 mL 07/02/2019 Intramuscular   Manufacturer: [place]   Lot: [id number]   [place]: [id number]

## 2019-08-11 ENCOUNTER — Ambulatory Visit: Payer: Medicare PPO | Admitting: *Deleted

## 2019-08-16 ENCOUNTER — Encounter: Payer: Self-pay | Admitting: Family Medicine

## 2019-08-16 DIAGNOSIS — K219 Gastro-esophageal reflux disease without esophagitis: Secondary | ICD-10-CM | POA: Insufficient documentation

## 2019-08-16 NOTE — Assessment & Plan Note (Signed)
Well controlled on current regimen. LDL had been at goal on current statin dose, patient has no side effects from medication and LFTS are normal. She is over due for labs- will place future orders.  No changes today.  Lab Results  Component Value Date   CHOL 208 (H) 06/22/2018   HDL 97.20 06/22/2018   LDLCALC 89 06/22/2018   LDLDIRECT 134.6 08/24/2013   TRIG 106.0 06/22/2018   CHOLHDL 2 06/22/2018

## 2019-08-16 NOTE — Progress Notes (Signed)
Virtual Visit via Video   Due to the COVID-19 pandemic, this visit was completed with telemedicine (audio/video) technology to reduce patient and provider exposure as well as to preserve personal protective equipment.   I connected with Patricia Hampton by a video enabled telemedicine application and verified that I am speaking with the correct person using two identifiers. Location patient: Home Location provider: [facility], Office Persons participating in the virtual visit: Aundria Mems, MD   I discussed the limitations of evaluation and management by telemedicine and the availability of in person appointments. The patient expressed understanding and agreed to proceed.  She had Colonoscopy this year. She is scheduled for fasting labs on Thursday.  Also scheduled for AWV tomorrow.  Care Team   Patient Care Team: Lucille Passy, MD as PCP - General  Subjective:   HPI: Patient is connecting today C/O hot flashes. She has been on Prempro from 5.7.12-2.3.2015.She also tried Combipatch [id number]. She says that she hasn't had an issue with it but it has like eased back in and she has them both day and night.  Is wanting to know if it could be a SE of a medication that she is currently on as she read that hot flashes can be a SE of Omeprazole. She is wondering if she needs to get Mammogram done yearly or every other year.  If needs this year then will need an order sent to [facility].    Review of Systems  Constitutional: Negative for fever and malaise/fatigue.  HENT: Negative for congestion and hearing loss.   Eyes: Negative for blurred vision, discharge and redness.  Respiratory: Negative for cough and shortness of breath.   Cardiovascular: Negative for chest pain, palpitations and leg swelling.  Gastrointestinal: Positive for heartburn. Negative for abdominal pain.  Genitourinary: Negative for dysuria.  Musculoskeletal: Negative for falls.  Skin:  Negative for rash.  Neurological: Negative for loss of consciousness and headaches.  Endo/Heme/Allergies: Does not bruise/bleed easily.       + hot flashes   Psychiatric/Behavioral: Negative for depression.  All other systems reviewed and are negative.    Patient Active Problem List   Diagnosis Date Noted  . GERD (gastroesophageal reflux disease) 08/16/2019  . Hair loss 11/19/2018  . Back problem 02/01/2016  . Osteopenia 04/25/2015  . History of postmenopausal HRT 04/25/2015  . FH: stroke 02/03/2014  . Family history of carotid artery stenosis 02/03/2014  . Allergic rhinitis 04/21/2008  . HLD (hyperlipidemia) 04/09/2007  . Diverticulosis of large intestine 08/22/2006  . OSTEOPENIA 02/19/2005    Social History   Tobacco Use  . Smoking status: Never Smoker  . Smokeless tobacco: Never Used  Substance Use Topics  . Alcohol use: Yes    Alcohol/week: 10.0 standard drinks    Types: 10 Glasses of wine per week    Comment: 8-10 glasses wine per week per pt    Current Outpatient Medications:  .  ALPRAZolam (XANAX) 0.25 MG tablet, TAKE 1 TO 2 TABLETS BY MOUTH EVERY 6 HOURS AS NEEDED FOR ANXIETY, Disp: 30 tablet, Rfl: 0 .  aspirin EC 81 MG tablet, Take 1 tablet (81 mg total) by mouth daily., Disp: , Rfl:  .  Biotin 1000 MCG tablet, Take 1 tablet (1 mg total) by mouth daily., Disp: 90 tablet, Rfl: 3 .  Calcium Carbonate-Vitamin D 600-400 MG-UNIT tablet, Take 1 tablet by mouth 2 (two) times daily., Disp: 180 tablet, Rfl: 3 .  Cholecalciferol (CVS VITAMIN  D3) 1000 units capsule, Take 1bid, Disp: 180 capsule, Rfl: 3 .  Omega-3 Fatty Acids (FISH OIL) 1000 MG CAPS, Take 2qd, Disp: 180 capsule, Rfl: 3 .  omeprazole (PRILOSEC) 40 MG capsule, TAKE 1 CAPSULE BY MOUTH EVERY DAY, Disp: 90 capsule, Rfl: 3 .  rosuvastatin (CRESTOR) 10 MG tablet, Take 1 tablet (10 mg total) by mouth daily., Disp: 30 tablet, Rfl: 0 .  spironolactone (ALDACTONE) 100 MG tablet, TAKE 1 TABLET BY MOUTH EVERY DAY, Disp:  90 tablet, Rfl: 1 .  tretinoin (RETIN-A) 0.025 % cream, Apply topically at bedtime., Disp: 45 g, Rfl: 11  No Known Allergies  Objective:  There were no vitals taken for this visit.  VITALS: Per patient if applicable, see vitals. GENERAL: Alert, appears well and in no acute distress. HEENT: Atraumatic, conjunctiva clear, no obvious abnormalities on inspection of external nose and ears. NECK: Normal movements of the head and neck. CARDIOPULMONARY: No increased WOB. Speaking in clear sentences. I:E ratio WNL.  MS: Moves all visible extremities without noticeable abnormality. PSYCH: Pleasant and cooperative, well-groomed. Speech normal rate and rhythm. Affect is appropriate. Insight and judgement are appropriate. Attention is focused, linear, and appropriate.  NEURO: CN grossly intact. Oriented as arrived to appointment on time with no prompting. Moves both UE equally.  SKIN: No obvious lesions, wounds, erythema, or cyanosis noted on face or hands.  Depression screen [facility] 2/9 06/22/2018 05/06/2017 02/22/2015  Decreased Interest 0 0 0  Down, Depressed, Hopeless 0 0 0  PHQ - 2 Score 0 0 0     . COVID-19 Education: The signs and symptoms of COVID-19 were discussed with the patient and how to seek care for testing if needed. The importance of social distancing was discussed today. . Reviewed expectations re: course of current medical issues. . Discussed self-management of symptoms. . Outlined signs and symptoms indicating need for more acute intervention. . Patient verbalized understanding and all questions were answered. Marland Kitchen Health Maintenance issues including appropriate healthy diet, exercise, and smoking avoidance were discussed with patient. . See orders for this visit as documented in the electronic medical record.  Arnette Norris, MD  Records requested if needed. Time spent: 25 minutes, of which >50% was spent in obtaining information about her symptoms, reviewing her previous labs,  evaluations, and treatments, counseling her about her condition (please see the discussed topics above), and developing a plan to further investigate it; she had a number of questions which I addressed.   Lab Results  Component Value Date   WBC 5.1 06/22/2018   HGB 15.0 06/22/2018   HCT 43.8 06/22/2018   PLT 265.0 06/22/2018   GLUCOSE 70 12/02/2018   CHOL 208 (H) 06/22/2018   TRIG 106.0 06/22/2018   HDL 97.20 06/22/2018   LDLDIRECT 134.6 08/24/2013   LDLCALC 89 06/22/2018   ALT 25 12/02/2018   AST 21 12/02/2018   NA 138 12/02/2018   K 4.7 12/02/2018   CL 99 12/02/2018   CREATININE 0.93 12/02/2018   BUN 15 12/02/2018   CO2 33 (H) 12/02/2018   TSH 1.42 12/02/2018   INR 0.9 06/30/2007    Lab Results  Component Value Date   TSH 1.42 12/02/2018   Lab Results  Component Value Date   WBC 5.1 06/22/2018   HGB 15.0 06/22/2018   HCT 43.8 06/22/2018   MCV 98.7 06/22/2018   PLT 265.0 06/22/2018   Lab Results  Component Value Date   NA 138 12/02/2018   K 4.7 12/02/2018   CO2  33 (H) 12/02/2018   GLUCOSE 70 12/02/2018   BUN 15 12/02/2018   CREATININE 0.93 12/02/2018   BILITOT 0.6 12/02/2018   ALKPHOS 54 12/02/2018   AST 21 12/02/2018   ALT 25 12/02/2018   PROT 7.2 12/02/2018   ALBUMIN 4.9 12/02/2018   CALCIUM 9.9 12/02/2018   GFR 60.40 12/02/2018   Lab Results  Component Value Date   CHOL 208 (H) 06/22/2018   Lab Results  Component Value Date   HDL 97.20 06/22/2018   Lab Results  Component Value Date   LDLCALC 89 06/22/2018   Lab Results  Component Value Date   TRIG 106.0 06/22/2018   Lab Results  Component Value Date   CHOLHDL 2 06/22/2018   No results found for: HGBA1C     Assessment & Plan:   Problem List Items Addressed This Visit      Active Problems   HLD (hyperlipidemia) - Primary    Well controlled on current regimen. LDL had been at goal on current statin dose, patient has no side effects from medication and LFTS are normal. She is over  due for labs- will place future orders.  No changes today.  Lab Results  Component Value Date   CHOL 208 (H) 06/22/2018   HDL 97.20 06/22/2018   LDLCALC 89 06/22/2018   LDLDIRECT 134.6 08/24/2013   TRIG 106.0 06/22/2018   CHOLHDL 2 06/22/2018         Relevant Orders   Lipid panel   Comprehensive metabolic panel   TSH   CBC with Differential/Platelet   Allergic rhinitis   GERD (gastroesophageal reflux disease)      I am having Patricia Hampton "Beth" maintain her Biotin, Calcium Carbonate-Vitamin D, Cholecalciferol, Fish Oil, aspirin EC, tretinoin, spironolactone, rosuvastatin, ALPRAZolam, and omeprazole.  No orders of the defined types were placed in this encounter.    Arnette Norris, MD

## 2019-08-17 ENCOUNTER — Ambulatory Visit (INDEPENDENT_AMBULATORY_CARE_PROVIDER_SITE_OTHER): Payer: Medicare PPO | Admitting: Family Medicine

## 2019-08-17 DIAGNOSIS — J309 Allergic rhinitis, unspecified: Secondary | ICD-10-CM | POA: Diagnosis not present

## 2019-08-17 DIAGNOSIS — E785 Hyperlipidemia, unspecified: Secondary | ICD-10-CM

## 2019-08-17 DIAGNOSIS — N951 Menopausal and female climacteric states: Secondary | ICD-10-CM

## 2019-08-17 DIAGNOSIS — K219 Gastro-esophageal reflux disease without esophagitis: Secondary | ICD-10-CM

## 2019-08-17 MED ORDER — PANTOPRAZOLE SODIUM 40 MG PO TBEC: 40.0000 mg | DELAYED_RELEASE_TABLET | Freq: Two times a day (BID) | ORAL | 3 refills | Status: DC

## 2019-08-17 NOTE — Progress Notes (Signed)
Virtual Visit via Audio Note  I connected with patient on 08/18/19 at  8:45 AM EST by audio enabled telemedicine application and verified that I am speaking with the correct person using two identifiers.   THIS ENCOUNTER IS A VIRTUAL VISIT DUE TO COVID-19 - PATIENT WAS NOT SEEN IN THE OFFICE. PATIENT HAS CONSENTED TO VIRTUAL VISIT / TELEMEDICINE VISIT   Location of patient: home  Location of provider: office  I discussed the limitations of evaluation and management by telemedicine and the availability of in person appointments. The patient expressed understanding and agreed to proceed.   Subjective:   Patricia Hampton is a 69 y.o. female who presents for an Initial Medicare Annual Wellness Visit.  The Patient was informed that the wellness visit is to identify future health risk and educate and initiate measures that can reduce risk for increased disease through the lifespan.    Review of Systems    Home Safety/Smoke Alarms: Feels safe in home. Smoke alarms in place.  Lives w/ husband in 1 story home. 1 dog.   Female:   Mammo-08/12/18. Pt states she will schedule.        Dexa scan-  07/29/18      CCS-02/10/19. Recall 5 yrs.     Objective:    Advanced Directives 08/18/2019  Does Patient Have a Medical Advance Directive? Yes  Type of Paramedic of [place];Living will  Does patient want to make changes to medical advance directive? No - Patient declined  Copy of [place] in Chart? No - copy requested    Current Medications (verified) Outpatient Encounter Medications as of 08/18/2019  Medication Sig  . ALPRAZolam (XANAX) 0.25 MG tablet TAKE 1 TO 2 TABLETS BY MOUTH EVERY 6 HOURS AS NEEDED FOR ANXIETY  . aspirin EC 81 MG tablet Take 1 tablet (81 mg total) by mouth daily.  . Biotin 1000 MCG tablet Take 1 tablet (1 mg total) by mouth daily.  . Calcium Carbonate-Vitamin D 600-400 MG-UNIT tablet Take 1 tablet by mouth 2 (two) times daily.   . Cholecalciferol ([employer] VITAMIN D3) 1000 units capsule Take 1bid  . Omega-3 Fatty Acids (FISH OIL) 1000 MG CAPS Take 2qd  . omeprazole (PRILOSEC) 40 MG capsule TAKE 1 CAPSULE BY MOUTH EVERY DAY  . pantoprazole (PROTONIX) 40 MG tablet Take 1 tablet (40 mg total) by mouth 2 (two) times daily before a meal.  . rosuvastatin (CRESTOR) 10 MG tablet Take 1 tablet (10 mg total) by mouth daily.  Marland Kitchen spironolactone (ALDACTONE) 100 MG tablet TAKE 1 TABLET BY MOUTH EVERY DAY  . tretinoin (RETIN-A) 0.025 % cream Apply topically at bedtime.   No facility-administered encounter medications on file as of 08/18/2019.    Allergies (verified) Patient has no known allergies.   History: Past Medical History:  Diagnosis Date  . Allergy   . Anxiety   . GERD (gastroesophageal reflux disease)   . Hyperlipidemia   . Osteopenia 02/19/2005  . Urinary incontinence    Past Surgical History:  Procedure Laterality Date  . bladder tack    . BREAST EXCISIONAL BIOPSY Left   . CESAREAN SECTION    . COLONOSCOPY  08/2006  . REDUCTION MAMMAPLASTY Bilateral 05/13/2017  . REPEAT CESAREAN SECTION     Family History  Problem Relation Age of Onset  . Hyperlipidemia Mother   . Stroke Father   . Hypertension Sister   . Depression Sister   . Alcohol abuse Sister   . Stroke Sister   .  Hyperlipidemia Brother   . Cancer Brother        ESOPHAGEAL  . Esophageal cancer Brother   . Cancer Maternal Grandmother        breast  . Breast cancer Maternal Grandmother   . Diabetes Paternal Grandfather   . Diabetes Brother   . Hypertension Brother   . Depression Brother   . Hyperlipidemia Brother   . Obesity Sister   . Colon cancer Neg Hx   . Colon polyps Neg Hx   . Rectal cancer Neg Hx   . Stomach cancer Neg Hx    Social History   Socioeconomic History  . Marital status: Married    Spouse name: Not on file  . Number of children: 2  . Years of education: Not on file  . Highest education level: Not on file    Occupational History    Employer: [employer]  Tobacco Use  . Smoking status: Never Smoker  . Smokeless tobacco: Never Used  Substance and Sexual Activity  . Alcohol use: Yes    Alcohol/week: 10.0 standard drinks    Types: 10 Glasses of wine per week    Comment: 8-10 glasses wine per week per pt  . Drug use: No  . Sexual activity: Yes  Other Topics Concern  . Not on file  Social History Narrative  . Not on file   Social Determinants of Health   Financial Resource Strain:   . Difficulty of Paying Living Expenses: Not on file  Food Insecurity:   . Worried About Charity fundraiser in the Last Year: Not on file  . Ran Out of Food in the Last Year: Not on file  Transportation Needs:   . Lack of Transportation (Medical): Not on file  . Lack of Transportation (Non-Medical): Not on file  Physical Activity:   . Days of Exercise per Week: Not on file  . Minutes of Exercise per Session: Not on file  Stress:   . Feeling of Stress : Not on file  Social Connections:   . Frequency of Communication with Friends and Family: Not on file  . Frequency of Social Gatherings with Friends and Family: Not on file  . Attends Religious Services: Not on file  . Active Member of Clubs or Organizations: Not on file  . Attends Archivist Meetings: Not on file  . Marital Status: Not on file    Tobacco Counseling Counseling given: Not Answered   Clinical Intake: Pain : No/denies pain     Activities of Daily Living In your present state of health, do you have any difficulty performing the following activities: 08/18/2019 08/17/2019  Hearing? N N  Vision? N N  Difficulty concentrating or making decisions? N N  Walking or climbing stairs? N N  Dressing or bathing? N N  Doing errands, shopping? N N  Preparing Food and eating ? N -  Using the Toilet? N -  In the past six months, have you accidently leaked urine? N -  Do you have problems with loss of bowel control? N -   Managing your Medications? N -  Managing your Finances? N -  Housekeeping or managing your Housekeeping? N -  Some recent data might be hidden     Immunizations and Health Maintenance Immunization History  Administered Date(s) Administered  . Fluad Quad(high Dose 65+) 04/05/2019  . Influenza,inj,Quad PF,6+ Mos 04/25/2015, 03/15/2017, 04/28/2018  . PFIZER SARS-COV-2 Vaccination 08/10/2019  . Pneumococcal Conjugate-13 06/22/2018  . Rabies,  IM 02/22/2012, 02/25/2012, 02/29/2012, 03/07/2012  . Rabies, intradermal 02/22/2012  . Td 01/16/2005  . Tdap 09/30/2016  . Zoster Recombinat (Shingrix) 04/28/2018, 07/21/2018   Health Maintenance Due  Topic Date Due  . PNA vac Low Risk Adult (2 of 2 - PPSV23) 06/23/2019  . MAMMOGRAM  08/13/2019    Patient Care Team: Lucille Passy, MD as PCP - General  Indicate any recent Medical Services you may have received from other than [facility] providers in the past year (date may be approximate).     Assessment:   This is a routine wellness examination for [place]. Physical assessment deferred to PCP.  Hearing/Vision screen Unable to assess. This visit is enabled though telemedicine due to Covid 19.   Dietary issues and exercise activities discussed: Current Exercise Habits: Home exercise routine, Type of exercise: walking, Time (Minutes): 45, Frequency (Times/Week): 5, Weekly Exercise (Minutes/Week): 225, Intensity: Mild, Exercise limited by: None identified Diet (meal preparation, eat out, water intake, caffeinated beverages, dairy products, fruits and vegetables): in general, a "healthy" diet  , well balanced      Goals    . Maintain healthy active lifestyle.      Depression Screen PHQ 2/9 Scores 08/18/2019 08/17/2019 06/22/2018 05/06/2017 02/22/2015  PHQ - 2 Score 0 0 0 0 0    Fall Risk Fall Risk  08/18/2019 08/17/2019 06/22/2018 05/06/2017  Falls in the past year? 0 0 0 No  Number falls in past yr: 0 - - -  Injury with Fall? 0 - - -   Follow up Education provided;Falls prevention discussed Falls evaluation completed Falls evaluation completed -   Cognitive Function: Ad8 score reviewed for issues:  Issues making decisions:no  Less interest in hobbies / activities:no  Repeats questions, stories (family complaining):no  Trouble using ordinary gadgets (microwave, computer, phone):no  Forgets the month or year: no  Mismanaging finances: no  Remembering appts:no  Daily problems with thinking and/or memory:no Ad8 score is=0         Screening Tests Health Maintenance  Topic Date Due  . PNA vac Low Risk Adult (2 of 2 - PPSV23) 06/23/2019  . MAMMOGRAM  08/13/2019  . COLONOSCOPY  02/10/2024  . TETANUS/TDAP  10/01/2026  . INFLUENZA VACCINE  Completed  . DEXA SCAN  Completed  . Hepatitis C Screening  Completed       Plan:    Please schedule your next medicare wellness visit with me in 1 yr.  Continue to eat heart healthy diet (full of fruits, vegetables, whole grains, lean protein, water--limit salt, fat, and sugar intake) and increase physical activity as tolerated.  Continue doing brain stimulating activities (puzzles, reading, adult coloring books, staying active) to keep memory sharp.   Continue doing brain stimulating activities (puzzles, reading, adult coloring books, staying active) to keep memory sharp.   Please schedule mammogram.  I have personally reviewed and noted the following in the patient's chart:   . Medical and social history . Use of alcohol, tobacco or illicit drugs  . Current medications and supplements . Functional ability and status . Nutritional status . Physical activity . Advanced directives . List of other physicians . Hospitalizations, surgeries, and ER visits in previous 12 months . Vitals . Screenings to include cognitive, depression, and falls . Referrals and appointments  In addition, I have reviewed and discussed with patient certain preventive protocols,  quality metrics, and best practice recommendations. A written personalized care plan for preventive services as well as general preventive health recommendations were  provided to patient.     Shela Nevin, [place]   08/18/2019

## 2019-08-17 NOTE — Assessment & Plan Note (Addendum)
Deteriorated in the past year.   She has been on Prempro from 5.7.12-2.3.2015.She also tried Combipatch 5.7.12-6.17.14. Then she quit HRT years ago.  She has a strong history of blood clots/strokes.  After discussing risks and benefits of HRT, she is not wanting to restart HRT at this time. She is  Is wanting to know if it could be a SE of a medication that she is currently on as she read that hot flashes can be a SE of Omeprazole.

## 2019-08-17 NOTE — Assessment & Plan Note (Signed)
History:  Deteriorated. Currently taking omeprazole 40 mg every morning.  Gastrointestinal ROS: no abdominal pain, change in bowel habits, or black or bloody stools. She is having more reflux symptoms in the morning.  Medication compliance: taking as prescribed. Assessment/Plan: We reviewed the diagnosis of GERD and the reasons why it was important to treat this. We discussed "red flag" symptoms and the importance of follow up if symptoms persisted despite treatment. We reviewed non-pharmacologic management of GERD symptoms: including: caffeine reduction, dietary changes, elevate HOB, NPO after supper, reduction of alcohol intake, tobacco cessation, and weight loss.  D/c omeprazole, start protonix 40 mg twice daily for a few weeks, decrease to 40 mg with evening meal.  eRx sent. Call or send my chart message prn if these symptoms worsen or fail to improve as anticipated. The patient indicates understanding of these issues and agrees with the plan.

## 2019-08-18 ENCOUNTER — Other Ambulatory Visit: Payer: Self-pay

## 2019-08-18 ENCOUNTER — Encounter: Payer: Self-pay | Admitting: *Deleted

## 2019-08-18 ENCOUNTER — Ambulatory Visit (INDEPENDENT_AMBULATORY_CARE_PROVIDER_SITE_OTHER): Payer: Medicare PPO | Admitting: *Deleted

## 2019-08-18 DIAGNOSIS — Z0000 Encounter for general adult medical examination without abnormal findings: Secondary | ICD-10-CM | POA: Diagnosis not present

## 2019-08-18 NOTE — Patient Instructions (Addendum)
Please schedule your next medicare wellness visit with me in 1 yr.  Continue to eat heart healthy diet (full of fruits, vegetables, whole grains, lean protein, water--limit salt, fat, and sugar intake) and increase physical activity as tolerated.  Continue doing brain stimulating activities (puzzles, reading, adult coloring books, staying active) to keep memory sharp.   Continue doing brain stimulating activities (puzzles, reading, adult coloring books, staying active) to keep memory sharp.   Please schedule mammogram.   [name] , Thank you for taking time to come for your Medicare Wellness Visit. I appreciate your ongoing commitment to your health goals. Please review the following plan we discussed and let me know if I can assist you in the future.   These are the goals we discussed: Goals    . Maintain healthy active lifestyle.       This is a list of the screening recommended for you and due dates:  Health Maintenance  Topic Date Due  . Pneumonia vaccines (2 of 2 - PPSV23) 06/23/2019  . Mammogram  08/13/2019  . Colon Cancer Screening  02/10/2024  . Tetanus Vaccine  10/01/2026  . Flu Shot  Completed  . DEXA scan (bone density measurement)  Completed  .  Hepatitis C: One time screening is recommended by Center for Disease Control  (CDC) for  adults born from [birth year] through 1963.   Completed    Preventive Care 37 Years and Older, Female Preventive care refers to lifestyle choices and visits with your health care provider that can promote health and wellness. This includes:  A yearly physical exam. This is also called an annual well check.  Regular dental and eye exams.  Immunizations.  Screening for certain conditions.  Healthy lifestyle choices, such as diet and exercise. What can I expect for my preventive care visit? Physical exam Your health care provider will check:  Height and weight. These may be used to calculate body mass index (BMI), which is a measurement  that tells if you are at a healthy weight.  Heart rate and blood pressure.  Your skin for abnormal spots. Counseling Your health care provider may ask you questions about:  Alcohol, tobacco, and drug use.  Emotional well-being.  Home and relationship well-being.  Sexual activity.  Eating habits.  History of falls.  Memory and ability to understand (cognition).  Work and work Statistician.  Pregnancy and menstrual history. What immunizations do I need?  Influenza (flu) vaccine  This is recommended every year. Tetanus, diphtheria, and pertussis (Tdap) vaccine  You may need a Td booster every 10 years. Varicella (chickenpox) vaccine  You may need this vaccine if you have not already been vaccinated. Zoster (shingles) vaccine  You may need this after age 14. Pneumococcal conjugate (PCV13) vaccine  One dose is recommended after age 14. Pneumococcal polysaccharide (PPSV23) vaccine  One dose is recommended after age 22. Measles, mumps, and rubella (MMR) vaccine  You may need at least one dose of MMR if you were born in 1957 or later. You may also need a second dose. Meningococcal conjugate (MenACWY) vaccine  You may need this if you have certain conditions. Hepatitis A vaccine  You may need this if you have certain conditions or if you travel or work in places where you may be exposed to hepatitis A. Hepatitis B vaccine  You may need this if you have certain conditions or if you travel or work in places where you may be exposed to hepatitis B. Haemophilus influenzae type  b (Hib) vaccine  You may need this if you have certain conditions. You may receive vaccines as individual doses or as more than one vaccine together in one shot (combination vaccines). Talk with your health care provider about the risks and benefits of combination vaccines. What tests do I need? Blood tests  Lipid and cholesterol levels. These may be checked every 5 years, or more frequently  depending on your overall health.  Hepatitis C test.  Hepatitis B test. Screening  Lung cancer screening. You may have this screening every year starting at age 41 if you have a 30-pack-year history of smoking and currently smoke or have quit within the past 15 years.  Colorectal cancer screening. All adults should have this screening starting at age 36 and continuing until age 34. Your health care provider may recommend screening at age 42 if you are at increased risk. You will have tests every 1-10 years, depending on your results and the type of screening test.  Diabetes screening. This is done by checking your blood sugar (glucose) after you have not eaten for a while (fasting). You may have this done every 1-3 years.  Mammogram. This may be done every 1-2 years. Talk with your health care provider about how often you should have regular mammograms.  BRCA-related cancer screening. This may be done if you have a family history of breast, ovarian, tubal, or peritoneal cancers. Other tests  Sexually transmitted disease (STD) testing.  Bone density scan. This is done to screen for osteoporosis. You may have this done starting at age 25. Follow these instructions at home: Eating and drinking  Eat a diet that includes fresh fruits and vegetables, whole grains, lean protein, and low-fat dairy products. Limit your intake of foods with high amounts of sugar, saturated fats, and salt.  Take vitamin and mineral supplements as recommended by your health care provider.  Do not drink alcohol if your health care provider tells you not to drink.  If you drink alcohol: ? Limit how much you have to 0-1 drink a day. ? Be aware of how much alcohol is in your drink. In the [place] one drink equals one 12 oz bottle of beer (355 mL), one 5 oz glass of wine (148 mL), or one 1 oz glass of hard liquor (44 mL). Lifestyle  Take daily care of your teeth and gums.  Stay active. Exercise for at least 30  minutes on 5 or more days each week.  Do not use any products that contain nicotine or tobacco, such as cigarettes, e-cigarettes, and chewing tobacco. If you need help quitting, ask your health care provider.  If you are sexually active, practice safe sex. Use a condom or other form of protection in order to prevent STIs (sexually transmitted infections).  Talk with your health care provider about taking a low-dose aspirin or statin. What's next?  Go to your health care provider once a year for a well check visit.  Ask your health care provider how often you should have your eyes and teeth checked.  Stay up to date on all vaccines. This information is not intended to replace advice given to you by your health care provider. Make sure you discuss any questions you have with your health care provider. Document Revised: 07/02/2018 Document Reviewed: 07/02/2018 Elsevier Patient Education  2020 [employer].

## 2019-08-19 ENCOUNTER — Other Ambulatory Visit (INDEPENDENT_AMBULATORY_CARE_PROVIDER_SITE_OTHER): Payer: Medicare PPO

## 2019-08-19 ENCOUNTER — Encounter: Payer: Self-pay | Admitting: Family Medicine

## 2019-08-19 DIAGNOSIS — E785 Hyperlipidemia, unspecified: Secondary | ICD-10-CM

## 2019-08-19 LAB — CBC WITH DIFFERENTIAL/PLATELET
Basophils Absolute: 0 10*3/uL (ref 0.0–0.1)
Basophils Relative: 0.6 % (ref 0.0–3.0)
Eosinophils Absolute: 0.3 10*3/uL (ref 0.0–0.7)
Eosinophils Relative: 4.8 % (ref 0.0–5.0)
HCT: 43.3 % (ref 36.0–46.0)
Hemoglobin: 14.8 g/dL (ref 12.0–15.0)
Lymphocytes Relative: 29.9 % (ref 12.0–46.0)
Lymphs Abs: 1.7 10*3/uL (ref 0.7–4.0)
MCHC: 34.2 g/dL (ref 30.0–36.0)
MCV: 99 fl (ref 78.0–100.0)
Monocytes Absolute: 0.5 10*3/uL (ref 0.1–1.0)
Monocytes Relative: 9.8 % (ref 3.0–12.0)
Neutro Abs: 3.1 10*3/uL (ref 1.4–7.7)
Neutrophils Relative %: 54.9 % (ref 43.0–77.0)
Platelets: 266 10*3/uL (ref 150.0–400.0)
RBC: 4.38 Mil/uL (ref 3.87–5.11)
RDW: 12.6 % (ref 11.5–15.5)
WBC: 5.6 10*3/uL (ref 4.0–10.5)

## 2019-08-19 LAB — COMPREHENSIVE METABOLIC PANEL
ALT: 25 U/L (ref 0–35)
AST: 19 U/L (ref 0–37)
Albumin: 4.7 g/dL (ref 3.5–5.2)
Alkaline Phosphatase: 49 U/L (ref 39–117)
BUN: 13 mg/dL (ref 6–23)
CO2: 30 mEq/L (ref 19–32)
Calcium: 9.6 mg/dL (ref 8.4–10.5)
Chloride: 103 mEq/L (ref 96–112)
Creatinine, Ser: 0.74 mg/dL (ref 0.40–1.20)
GFR: 78.45 mL/min (ref >60.00)
Glucose, Bld: 75 mg/dL (ref 70–99)
Potassium: 4.1 mEq/L (ref 3.5–5.1)
Sodium: 139 mEq/L (ref 135–145)
Total Bilirubin: 0.7 mg/dL (ref 0.2–1.2)
Total Protein: 6.5 g/dL (ref 6.0–8.3)

## 2019-08-19 LAB — LIPID PANEL
Cholesterol: 226 mg/dL — ABNORMAL HIGH (ref 0–200)
HDL: 95.1 mg/dL (ref >39.00)
LDL Cholesterol: 116 mg/dL — ABNORMAL HIGH (ref 0–99)
NonHDL: 130.8
Total CHOL/HDL Ratio: 2
Triglycerides: 74 mg/dL (ref 0.0–149.0)
VLDL: 14.8 mg/dL (ref 0.0–40.0)

## 2019-08-19 LAB — TSH: TSH: 1.09 u[IU]/mL (ref 0.35–4.50)

## 2019-08-23 IMAGING — MR MR FOOT*R* W/O CM
5 series · 40 of 40 positions shown · non-contrast
Comparison: None.

CLINICAL DATA: Right anterolateral ankle pain.  Bruising.

EXAM:
MRI OF THE RIGHT FOREFOOT WITHOUT CONTRAST
TECHNIQUE: Multiplanar, multisequence MR imaging of the right foot was
performed. No intravenous contrast was administered.

[Series 3: PD fat-sat · axial · 3.0mm · 0.70mm/px · z∈[-119,+9]mm · 9 of 40 slices shown]
[im 1/40]
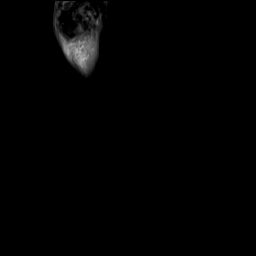
[im 5/40]
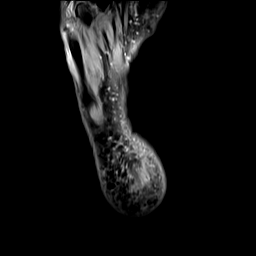
[im 10/40]
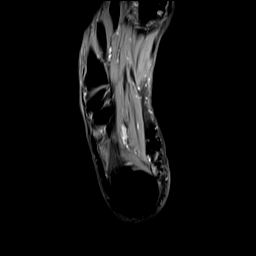
[im 15/40]
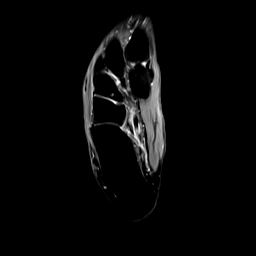
[im 20/40]
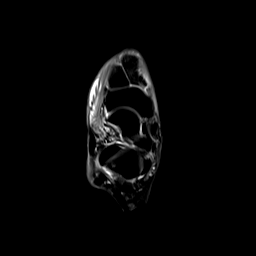
[im 25/40]
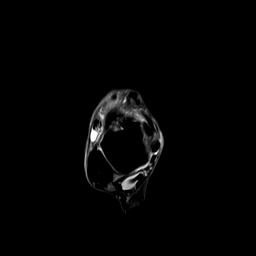
[im 30/40]
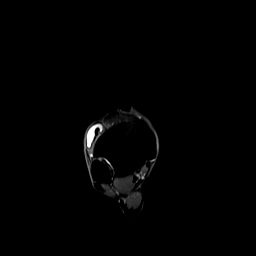
[im 35/40]
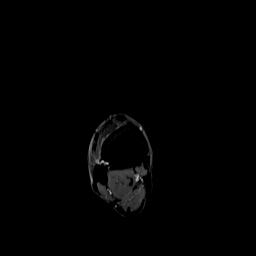
[im 40/40]
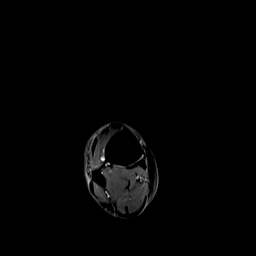

[Series 4: T2 fat-sat · axial · 3.0mm · 0.70mm/px · z∈[-119,+9]mm · 9 of 40 slices shown (1 of 2)]
[im 1/40]
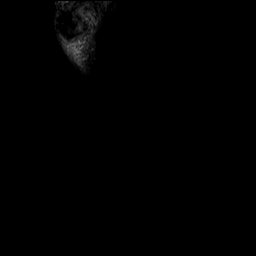
[im 5/40]
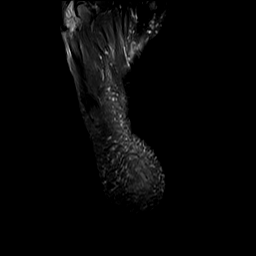
[im 10/40]
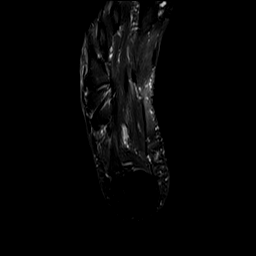
[im 15/40]
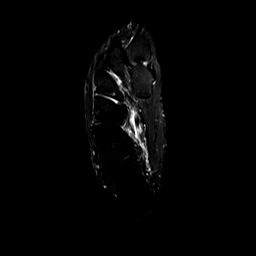
[im 20/40]
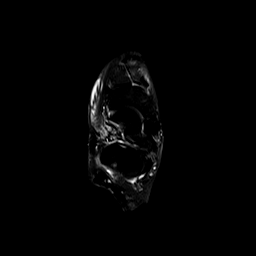
[im 25/40]
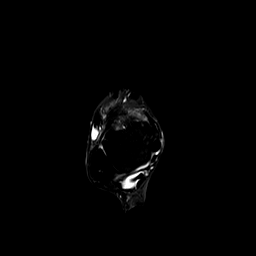
[im 30/40]
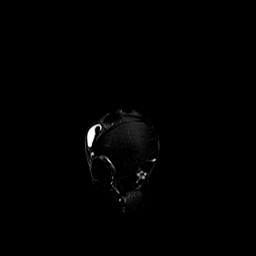
[im 35/40]
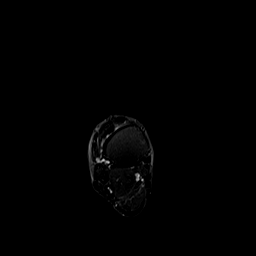
[im 40/40]
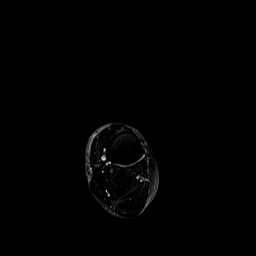

[Series 5: T2 fat-sat · coronal · 3.0mm · 0.70mm/px · 10 of 45 slices shown (2 of 2)]
[im 1/45]
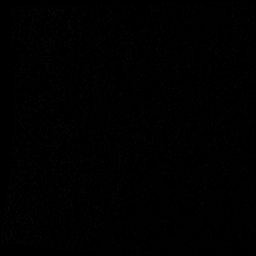
[im 5/45]
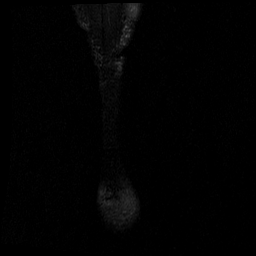
[im 10/45]
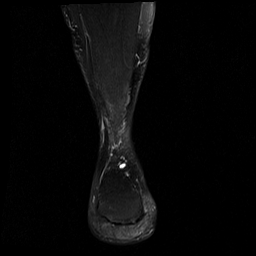
[im 15/45]
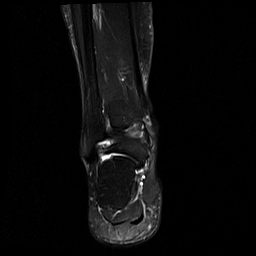
[im 20/45]
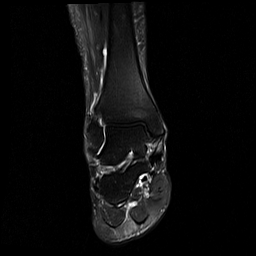
[im 25/45]
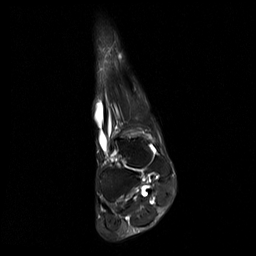
[im 30/45]
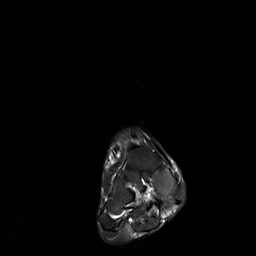
[im 35/45]
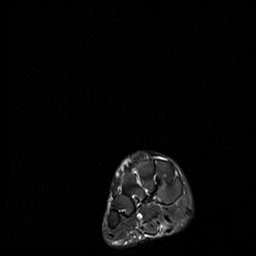
[im 40/45]
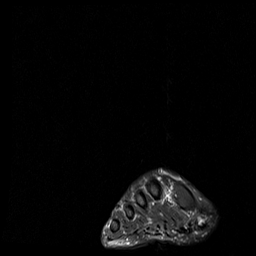
[im 45/45]
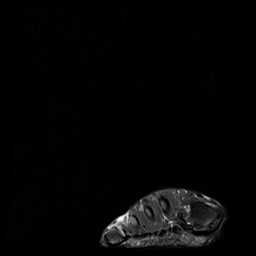

[Series 6: T1 · sagittal · 3.0mm · 0.56mm/px · 6 of 26 slices shown]
[im 1/26]
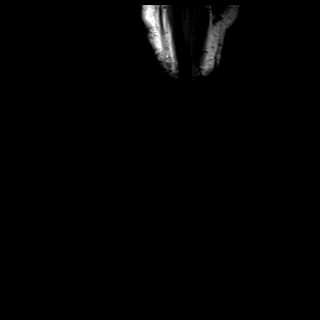
[im 6/26]
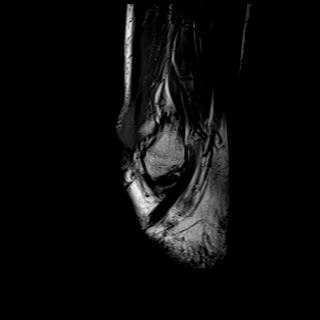
[im 11/26]
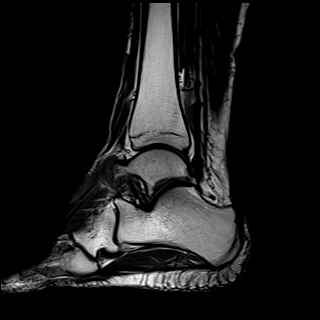
[im 16/26]
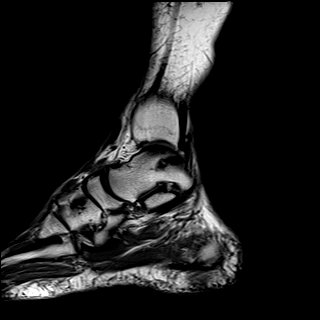
[im 21/26]
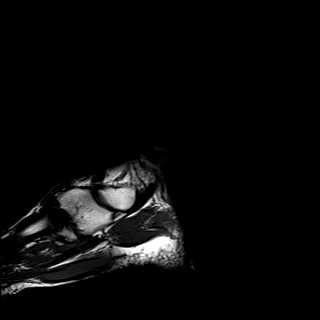
[im 26/26]
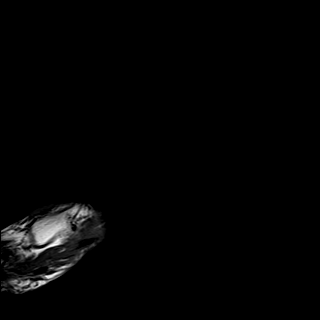

[Series 7: STIR · sagittal · 3.0mm · 0.70mm/px · 6 of 26 slices shown]
[im 1/26]
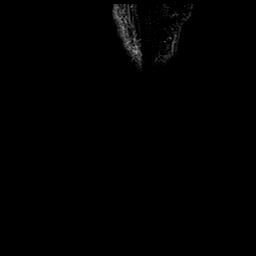
[im 6/26]
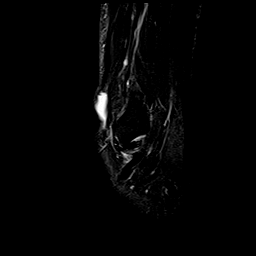
[im 11/26]
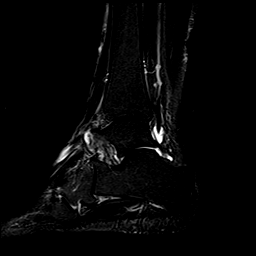
[im 16/26]
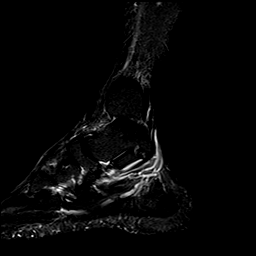
[im 21/26]
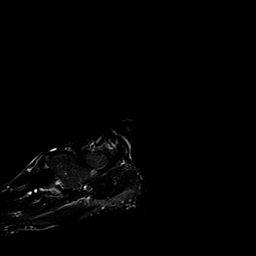
[im 26/26]
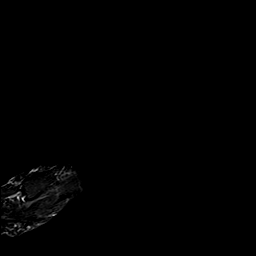

[40 of 40 positions shown; findings below may reference images not displayed]

FINDINGS: TENDONS

Peroneal: Peroneal longus tendon intact. Peroneal brevis intact.

Posteromedial: Posterior tibial tendon intact. Flexor hallucis
longus tendon intact. Flexor digitorum longus tendon intact.

Anterior: Tibialis anterior tendon intact. Extensor hallucis longus
tendon intact. Severe tenosynovitis of the extensor digitorum tendon
which is otherwise intact.

Achilles:  Intact.

Plantar Fascia: Intact.

LIGAMENTS

Lateral: Anterior talofibular ligament intact. Calcaneofibular
ligament intact. Posterior talofibular ligament intact. Anterior and
posterior tibiofibular ligaments intact.

Medial: Deltoid ligament intact. Spring ligament intact.

CARTILAGE

Ankle Joint: No joint effusion. Normal ankle mortise. No chondral
defect.

Subtalar Joints/Sinus Tarsi: Normal subtalar joints. No subtalar
joint effusion. Normal sinus tarsi.

Bones: No marrow signal abnormality.  No fracture or dislocation.

Soft Tissue: No fluid collection or hematoma.  Muscles are normal.
IMPRESSION: 1. Severe tenosynovitis of the extensor digitorum tendon which is
otherwise intact.

## 2019-08-26 ENCOUNTER — Other Ambulatory Visit: Payer: Medicare PPO

## 2019-08-28 ENCOUNTER — Ambulatory Visit: Payer: Medicare PPO | Attending: Internal Medicine

## 2019-08-28 DIAGNOSIS — Z23 Encounter for immunization: Secondary | ICD-10-CM

## 2019-08-28 NOTE — Progress Notes (Signed)
   Covid-19 Vaccination Clinic  Name:  Patricia Hampton    MRN: [id number] DOB: 14-Aug-1950  08/28/2019  Patricia Hampton was observed post Covid-19 immunization for 15 minutes without incidence. She was provided with Vaccine Information Sheet and instruction to access the V-Safe system.   Patricia Hampton was instructed to call 911 with any severe reactions post vaccine: Marland Kitchen Difficulty breathing  . Swelling of your face and throat  . A fast heartbeat  . A bad rash all over your body  . Dizziness and weakness    Immunizations Administered    Name Date Dose VIS Date Route   Pfizer COVID-19 Vaccine 08/28/2019 11:31 AM 0.3 mL 07/02/2019 Intramuscular   Manufacturer: Eva   Lot: [id number]   [place]: [id number]

## 2019-08-30 ENCOUNTER — Other Ambulatory Visit: Payer: Self-pay

## 2019-08-30 MED ORDER — ROSUVASTATIN CALCIUM 10 MG PO TABS: 10.0000 mg | ORAL_TABLET | Freq: Every day | ORAL | 3 refills | Status: DC

## 2019-09-21 ENCOUNTER — Telehealth: Payer: Self-pay | Admitting: General Practice

## 2019-09-21 NOTE — Telephone Encounter (Signed)
Patient is calling requesting a refill for Xanax to [facility] on [address]. Pt will call back to set up [place] appointment. CB is [phone]

## 2019-09-22 MED ORDER — ALPRAZOLAM 0.25 MG PO TABS: ORAL_TABLET | ORAL | 0 refills | Status: DC

## 2019-09-22 NOTE — Telephone Encounter (Signed)
Rx refilled. Pt will need TOC appt prior to next refill

## 2019-09-22 NOTE — Telephone Encounter (Signed)
Left a detailed message on voicemail, to call office and schedule a TOC visit.

## 2019-09-30 ENCOUNTER — Encounter: Payer: Self-pay | Admitting: Family Medicine

## 2019-09-30 ENCOUNTER — Telehealth (INDEPENDENT_AMBULATORY_CARE_PROVIDER_SITE_OTHER): Payer: Medicare PPO | Admitting: Family Medicine

## 2019-09-30 VITALS — Ht 60.0 in

## 2019-09-30 DIAGNOSIS — F419 Anxiety disorder, unspecified: Secondary | ICD-10-CM | POA: Diagnosis not present

## 2019-09-30 NOTE — Progress Notes (Signed)
Virtual Visit via Video Note  I connected with Patricia Hampton on 09/30/19 at  9:30 AM EST by a video enabled telemedicine application and verified that I am speaking with the correct person using two identifiers. Location patient: home Location provider: work  Persons participating in the virtual visit: patient, provider  I discussed the limitations of evaluation and management by telemedicine and the availability of in person appointments. The patient expressed understanding and agreed to proceed.  Chief Complaint  Patient presents with  . Establish Care    TOC.  Pt wanted to discuss Xanax     HPI: Patricia Hampton is a 69 y.o. female seen today as a TOC from previous PCP Dr. Deborra Medina. Pt has h/o anxiety for which she has been Rx'd xanax 0.25mg  1-2x/wk on average. She has been on this for years.  She is due for mammo - last done 07/2018.  UTD on colonoscopy. UTD on Dexa.  Last labs 07/2019.   Past Medical History:  Diagnosis Date  . Allergy   . Anxiety   . GERD (gastroesophageal reflux disease)   . Hyperlipidemia   . Osteopenia 02/19/2005  . Urinary incontinence     Past Surgical History:  Procedure Laterality Date  . bladder tack    . BREAST EXCISIONAL BIOPSY Left   . CESAREAN SECTION    . COLONOSCOPY  08/2006  . REDUCTION MAMMAPLASTY Bilateral 05/13/2017  . REPEAT CESAREAN SECTION      Family History  Problem Relation Age of Onset  . Hyperlipidemia Mother   . Stroke Father   . Hypertension Sister   . Depression Sister   . Alcohol abuse Sister   . Stroke Sister   . Hyperlipidemia Brother   . Cancer Brother        ESOPHAGEAL  . Esophageal cancer Brother   . Cancer Maternal Grandmother        breast  . Breast cancer Maternal Grandmother   . Diabetes Paternal Grandfather   . Diabetes Brother   . Hypertension Brother   . Depression Brother   . Hyperlipidemia Brother   . Obesity Sister   . Colon cancer Neg Hx   . Colon polyps Neg Hx   . Rectal cancer  Neg Hx   . Stomach cancer Neg Hx     Social History   Tobacco Use  . Smoking status: Never Smoker  . Smokeless tobacco: Never Used  Substance Use Topics  . Alcohol use: Yes    Alcohol/week: 10.0 standard drinks    Types: 10 Glasses of wine per week    Comment: 8-10 glasses wine per week per pt  . Drug use: No     Current Outpatient Medications:  .  ALPRAZolam (XANAX) 0.25 MG tablet, TAKE 1 TO 2 TABLETS BY MOUTH EVERY 6 HOURS AS NEEDED FOR ANXIETY, Disp: 30 tablet, Rfl: 0 .  aspirin EC 81 MG tablet, Take 1 tablet (81 mg total) by mouth daily., Disp: , Rfl:  .  Biotin 1000 MCG tablet, Take 1 tablet (1 mg total) by mouth daily., Disp: 90 tablet, Rfl: 3 .  Calcium Carbonate-Vitamin D 600-400 MG-UNIT tablet, Take 1 tablet by mouth 2 (two) times daily., Disp: 180 tablet, Rfl: 3 .  Cholecalciferol (CVS VITAMIN D3) 1000 units capsule, Take 1bid, Disp: 180 capsule, Rfl: 3 .  Omega-3 Fatty Acids (FISH OIL) 1000 MG CAPS, Take 2qd, Disp: 180 capsule, Rfl: 3 .  pantoprazole (PROTONIX) 40 MG tablet, Take 1 tablet (40 mg total) by  mouth 2 (two) times daily before a meal., Disp: 60 tablet, Rfl: 3 .  rosuvastatin (CRESTOR) 10 MG tablet, Take 1 tablet (10 mg total) by mouth daily., Disp: 90 tablet, Rfl: 3 .  spironolactone (ALDACTONE) 100 MG tablet, TAKE 1 TABLET BY MOUTH EVERY DAY, Disp: 90 tablet, Rfl: 1 .  tretinoin (RETIN-A) 0.025 % cream, Apply topically at bedtime., Disp: 45 g, Rfl: 11  No Known Allergies    ROS: See pertinent positives and negatives per HPI.   EXAM:  VITALS per patient if applicable: Ht 5' ([id number] m)   BMI 23.24 kg/m    GENERAL: alert, oriented, appears well and in no acute distress  HEENT: atraumatic, conjunctiva clear, no obvious abnormalities on inspection of external nose and ears  NECK: normal movements of the head and neck  LUNGS: on inspection no signs of respiratory distress, breathing rate appears normal, no obvious gross SOB, gasping or wheezing, no  conversational dyspnea  CV: no obvious cyanosis  PSYCH/NEURO: pleasant and cooperative, no obvious depression or anxiety, speech and thought processing grossly intact   ASSESSMENT AND PLAN: 1. Anxiety - pt hs taken xanax 0.25-0.5mg  PRN x 10 years. She takes 1-2x/wk on average, often less - insurance will no longer cover xanax as per a letter pt received. She will send to [place] via MyChart and will check insurance formulary to see what alternative options are    I discussed the assessment and treatment plan with the patient. The patient was provided an opportunity to ask questions and all were answered. The patient agreed with the plan and demonstrated an understanding of the instructions.   The patient was advised to call back or seek an in-person evaluation if the symptoms worsen or if the condition fails to improve as anticipated.   Letta Median, DO

## 2019-10-05 DIAGNOSIS — L719 Rosacea, unspecified: Secondary | ICD-10-CM | POA: Diagnosis not present

## 2019-10-05 DIAGNOSIS — L603 Nail dystrophy: Secondary | ICD-10-CM | POA: Diagnosis not present

## 2019-10-06 ENCOUNTER — Other Ambulatory Visit: Payer: Self-pay

## 2019-10-06 MED ORDER — SPIRONOLACTONE 100 MG PO TABS: ORAL_TABLET | ORAL | 1 refills | Status: DC

## 2019-10-20 ENCOUNTER — Encounter: Payer: Self-pay | Admitting: Family Medicine

## 2019-10-25 ENCOUNTER — Other Ambulatory Visit: Payer: Self-pay | Admitting: Family Medicine

## 2019-10-25 NOTE — Telephone Encounter (Signed)
[facility]-Plz see refill req/thx dmf 

## 2019-10-27 NOTE — Telephone Encounter (Signed)
Last VV 09/30/19 Last fill 09/22/19  #30/0 Please advise

## 2019-10-27 NOTE — Telephone Encounter (Signed)
Patient is calling to check the status of medication for alprazolam. CB is [phone]

## 2019-10-29 MED ORDER — ALPRAZOLAM 0.25 MG PO TABS: ORAL_TABLET | ORAL | 0 refills | Status: DC

## 2019-10-29 NOTE — Telephone Encounter (Signed)
Rx refilled.

## 2019-10-29 NOTE — Addendum Note (Signed)
Addended by: Ronnald Nian on: 10/29/2019 12:39 PM   Modules accepted: Orders

## 2019-11-04 ENCOUNTER — Encounter: Payer: Self-pay | Admitting: Family Medicine

## 2019-11-04 MED ORDER — ALPRAZOLAM 0.25 MG PO TABS: ORAL_TABLET | ORAL | 0 refills | Status: DC

## 2019-11-04 NOTE — Telephone Encounter (Signed)
Pt called and said she needs a one time script sent there because they cant transfer do to it being a controlled medication, 1. ALPRAZolam

## 2019-11-15 ENCOUNTER — Encounter: Payer: Self-pay | Admitting: Family

## 2019-11-15 ENCOUNTER — Other Ambulatory Visit: Payer: Self-pay

## 2019-11-15 ENCOUNTER — Telehealth (INDEPENDENT_AMBULATORY_CARE_PROVIDER_SITE_OTHER): Payer: Medicare PPO | Admitting: Family

## 2019-11-15 ENCOUNTER — Encounter: Payer: Self-pay | Admitting: Family Medicine

## 2019-11-15 VITALS — Ht 60.0 in

## 2019-11-15 DIAGNOSIS — M546 Pain in thoracic spine: Secondary | ICD-10-CM

## 2019-11-15 MED ORDER — CYCLOBENZAPRINE HCL 5 MG PO TABS: 5.0000 mg | ORAL_TABLET | Freq: Three times a day (TID) | ORAL | 1 refills | Status: DC | PRN

## 2019-11-15 MED ORDER — CELECOXIB 100 MG PO CAPS: 100.0000 mg | ORAL_CAPSULE | Freq: Every day | ORAL | 0 refills | Status: DC

## 2019-11-15 NOTE — Patient Instructions (Signed)
Acute Back Pain, Adult Acute back pain is sudden and usually short-lived. It is often caused by an injury to the muscles and tissues in the back. The injury may result from:  A muscle or ligament getting overstretched or torn (strained). Ligaments are tissues that connect bones to each other. Lifting something improperly can cause a back strain.  Wear and tear (degeneration) of the spinal disks. Spinal disks are circular tissue that provides cushioning between the bones of the spine (vertebrae).  Twisting motions, such as while playing sports or doing yard work.  A hit to the back.  Arthritis. You may have a physical exam, lab tests, and imaging tests to find the cause of your pain. Acute back pain usually goes away with rest and home care. Follow these instructions at home: Managing pain, stiffness, and swelling  Take over-the-counter and prescription medicines only as told by your health care provider.  Your health care provider may recommend applying ice during the first 24-48 hours after your pain starts. To do this: ? Put ice in a plastic bag. ? Place a towel between your skin and the bag. ? Leave the ice on for 20 minutes, 2-3 times a day.  If directed, apply heat to the affected area as often as told by your health care provider. Use the heat source that your health care provider recommends, such as a moist heat pack or a heating pad. ? Place a towel between your skin and the heat source. ? Leave the heat on for 20-30 minutes. ? Remove the heat if your skin turns bright red. This is especially important if you are unable to feel pain, heat, or cold. You have a greater risk of getting burned. Activity   Do not stay in bed. Staying in bed for more than 1-2 days can delay your recovery.  Sit up and stand up straight. Avoid leaning forward when you sit, or hunching over when you stand. ? If you work at a desk, sit close to it so you do not need to lean over. Keep your chin tucked  in. Keep your neck drawn back, and keep your elbows bent at a right angle. Your arms should look like the letter "L." ? Sit high and close to the steering wheel when you drive. Add lower back (lumbar) support to your car seat, if needed.  Take short walks on even surfaces as soon as you are able. Try to increase the length of time you walk each day.  Do not sit, drive, or stand in one place for more than 30 minutes at a time. Sitting or standing for long periods of time can put stress on your back.  Do not drive or use heavy machinery while taking prescription pain medicine.  Use proper lifting techniques. When you bend and lift, use positions that put less stress on your back: ? Bend your knees. ? Keep the load close to your body. ? Avoid twisting.  Exercise regularly as told by your health care provider. Exercising helps your back heal faster and helps prevent back injuries by keeping muscles strong and flexible.  Work with a physical therapist to make a safe exercise program, as recommended by your health care provider. Do any exercises as told by your physical therapist. Lifestyle  Maintain a healthy weight. Extra weight puts stress on your back and makes it difficult to have good posture.  Avoid activities or situations that make you feel anxious or stressed. Stress and anxiety increase muscle   tension and can make back pain worse. Learn ways to manage anxiety and stress, such as through exercise. General instructions  Sleep on a firm mattress in a comfortable position. Try lying on your side with your knees slightly bent. If you lie on your back, put a pillow under your knees.  Follow your treatment plan as told by your health care provider. This may include: ? Cognitive or behavioral therapy. ? Acupuncture or massage therapy. ? Meditation or yoga. Contact a health care provider if:  You have pain that is not relieved with rest or medicine.  You have increasing pain going down  into your legs or buttocks.  Your pain does not improve after 2 weeks.  You have pain at night.  You lose weight without trying.  You have a fever or chills. Get help right away if:  You develop new bowel or bladder control problems.  You have unusual weakness or numbness in your arms or legs.  You develop nausea or vomiting.  You develop abdominal pain.  You feel faint. Summary  Acute back pain is sudden and usually short-lived.  Use proper lifting techniques. When you bend and lift, use positions that put less stress on your back.  Take over-the-counter and prescription medicines and apply heat or ice as directed by your health care provider. This information is not intended to replace advice given to you by your health care provider. Make sure you discuss any questions you have with your health care provider. Document Revised: 10/27/2018 Document Reviewed: 02/19/2017 Elsevier Patient Education  2020 Elsevier Inc.  

## 2019-11-15 NOTE — Progress Notes (Signed)
Acute Office Visit  Subjective:    Patient ID: Patricia Hampton, female    DOB: 1951-05-14, 69 y.o.   MRN: [id number]  Chief Complaint  Patient presents with  . Back Pain    Pt c/o lower back pain, middle.  X2days.  Pt has a hx of back  pain.  Pt said that she dose not know how it started.  Pt has taken ibuprofen without any relief.   Virtual Visit via Video Note  I connected with Patricia Hampton on 11/15/19 at  4:00 PM EDT by a video enabled telemedicine application and verified that I am speaking with the correct person using two identifiers.  Location: Patient: Home Provider: Office   I discussed the limitations of evaluation and management by telemedicine and the availability of in person appointments. The patient expressed understanding and agreed to proceed.  History of Present Illness:    Observations/Objective:   Assessment and Plan: Thoracic back pain   Follow Up Instructions: Patricia Hampton was seen today for back pain.  Diagnoses and all orders for this visit:  Acute bilateral thoracic back pain  Other orders -     cyclobenzaprine (FLEXERIL) 5 MG tablet; Take 1 tablet (5 mg total) by mouth 3 (three) times daily as needed for muscle spasms. -     celecoxib (CELEBREX) 100 MG capsule; Take 1 capsule (100 mg total) by mouth daily.      I discussed the assessment and treatment plan with the patient. The patient was provided an opportunity to ask questions and all were answered. The patient agreed with the plan and demonstrated an understanding of the instructions.   The patient was advised to call back or seek an in-person evaluation if the symptoms worsen or if the condition fails to improve as anticipated. Patricia Hampton, Patricia Hampton are scheduled for a virtual visit with your provider today.    Just as we do with appointments in the office, we must obtain your consent to participate.  Your consent will be active for this visit and any virtual visit you may have with  one of our providers in the next 365 days.    If you have a MyChart account, I can also send a copy of this consent to you electronically.  All virtual visits are billed to your insurance company just like a traditional visit in the office.  As this is a virtual visit, video technology does not allow for your provider to perform a traditional examination.  This may limit your provider's ability to fully assess your condition.  If your provider identifies any concerns that need to be evaluated in person or the need to arrange testing such as labs, EKG, etc, we will make arrangements to do so.    Although advances in technology are sophisticated, we cannot ensure that it will always work on either your end or our end.  If the connection with a video visit is poor, we may have to switch to a telephone visit.  With either a video or telephone visit, we are not always able to ensure that we have a secure connection.   I need to obtain your verbal consent now.   Are you willing to proceed with your visit today? YES  Patricia Hampton has provided verbal consent on 11/15/2019 for a virtual visit (video or telephone).   Patricia Arnold, FNP 11/15/2019  4:25 PM   I provided 15 minutes of non-face-to-face time during this encounter.   Patricia Arnold,  FNP  HPI Patient is in by virtual visit with c/o thoracic spine pain.  Ibuprofen she has taken 3 doses of 400 mg that helps some. Pain in the past has been worse. Pain has been 7/10. She reports being able to continue to do her house. Denies any frequency or urgency to urinate. Denies fever and chills.   Past Medical History:  Diagnosis Date  . Allergy   . Anxiety   . GERD (gastroesophageal reflux disease)   . Hyperlipidemia   . Osteopenia 02/19/2005  . Urinary incontinence     Past Surgical History:  Procedure Laterality Date  . bladder tack    . BREAST EXCISIONAL BIOPSY Left   . CESAREAN SECTION    . COLONOSCOPY  08/2006  . REDUCTION  MAMMAPLASTY Bilateral 05/13/2017  . REPEAT CESAREAN SECTION      Family History  Problem Relation Age of Onset  . Hyperlipidemia Mother   . Stroke Father   . Hypertension Sister   . Depression Sister   . Alcohol abuse Sister   . Stroke Sister   . Hyperlipidemia Brother   . Cancer Brother        ESOPHAGEAL  . Esophageal cancer Brother   . Cancer Maternal Grandmother        breast  . Breast cancer Maternal Grandmother   . Diabetes Paternal Grandfather   . Diabetes Brother   . Hypertension Brother   . Depression Brother   . Hyperlipidemia Brother   . Obesity Sister   . Colon cancer Neg Hx   . Colon polyps Neg Hx   . Rectal cancer Neg Hx   . Stomach cancer Neg Hx     Social History   Socioeconomic History  . Marital status: Married    Spouse name: Not on file  . Number of children: 2  . Years of education: Not on file  . Highest education level: Not on file  Occupational History    Employer: [employer]  Tobacco Use  . Smoking status: Never Smoker  . Smokeless tobacco: Never Used  Substance and Sexual Activity  . Alcohol use: Yes    Alcohol/week: 10.0 standard drinks    Types: 10 Glasses of wine per week    Comment: 8-10 glasses wine per week per pt  . Drug use: No  . Sexual activity: Yes  Other Topics Concern  . Not on file  Social History Narrative  . Not on file   Social Determinants of Health   Financial Resource Strain: Low Risk   . Difficulty of Paying Living Expenses: Not hard at all  Food Insecurity: No Food Insecurity  . Worried About Charity fundraiser in the Last Year: Never true  . Ran Out of Food in the Last Year: Never true  Transportation Needs: No Transportation Needs  . Lack of Transportation (Medical): No  . Lack of Transportation (Non-Medical): No  Physical Activity:   . Days of Exercise per Week:   . Minutes of Exercise per Session:   Stress:   . Feeling of Stress :   Social Connections:   . Frequency of  Communication with Friends and Family:   . Frequency of Social Gatherings with Friends and Family:   . Attends Religious Services:   . Active Member of Clubs or Organizations:   . Attends Archivist Meetings:   Marland Kitchen Marital Status:   Intimate Partner Violence:   . Fear of Current or Ex-Partner:   . Emotionally Abused:   .  Physically Abused:   . Sexually Abused:     Outpatient Medications Prior to Visit  Medication Sig Dispense Refill  . ALPRAZolam (XANAX) 0.25 MG tablet TAKE 1 TO 2 TABLETS BY MOUTH EVERY 6 HOURS AS NEEDED FOR ANXIETY 30 tablet 0  . Biotin 1000 MCG tablet Take 1 tablet (1 mg total) by mouth daily. 90 tablet 3  . Calcium Carbonate-Vitamin D 600-400 MG-UNIT tablet Take 1 tablet by mouth 2 (two) times daily. 180 tablet 3  . Cholecalciferol ([employer]S VITAMIN D3) 1000 units capsule Take 1bid 180 capsule 3  . Omega-3 Fatty Acids (FISH OIL) 1000 MG CAPS Take 2qd 180 capsule 3  . pantoprazole (PROTONIX) 40 MG tablet Take 1 tablet (40 mg total) by mouth 2 (two) times daily before a meal. 60 tablet 3  . rosuvastatin (CRESTOR) 10 MG tablet Take 1 tablet (10 mg total) by mouth daily. 90 tablet 3  . spironolactone (ALDACTONE) 100 MG tablet TAKE 1 TABLET BY MOUTH EVERY DAY 90 tablet 1  . tretinoin (RETIN-A) 0.025 % cream Apply topically at bedtime. 45 g 11  . aspirin EC 81 MG tablet Take 1 tablet (81 mg total) by mouth daily. (Patient not taking: Reported on 11/15/2019)     No facility-administered medications prior to visit.    No Known Allergies  Review of Systems  Constitutional: Negative.   Respiratory: Negative.   Cardiovascular: Negative.   Endocrine: Negative.   Genitourinary: Negative for dysuria, frequency and urgency.  Musculoskeletal: Positive for back pain.  Skin: Negative.   Neurological: Negative.   Hematological: Negative.   Psychiatric/Behavioral: Negative.        Objective:    Physical Exam Constitutional:      Appearance: Normal appearance.    Neurological:     Mental Status: She is alert.  Psychiatric:        Mood and Affect: Mood normal.     Ht 5' (1.524 m)   BMI 23.24 kg/m  Wt Readings from Last 3 Encounters:  02/10/19 119 lb (54 kg)  01/27/19 119 lb (54 kg)  12/07/18 119 lb (54 kg)    Health Maintenance Due  Topic Date Due  . PNA vac Low Risk Adult (2 of 2 - PPSV23) 06/23/2019  . MAMMOGRAM  08/13/2019    There are no preventive care reminders to display for this patient.   Lab Results  Component Value Date   TSH 1.09 08/19/2019   Lab Results  Component Value Date   WBC 5.6 08/19/2019   HGB 14.8 08/19/2019   HCT 43.3 08/19/2019   MCV 99.0 08/19/2019   PLT 266.0 08/19/2019   Lab Results  Component Value Date   NA 139 08/19/2019   K 4.1 08/19/2019   CO2 30 08/19/2019   GLUCOSE 75 08/19/2019   BUN 13 08/19/2019   CREATININE 0.74 08/19/2019   BILITOT 0.7 08/19/2019   ALKPHOS 49 08/19/2019   AST 19 08/19/2019   ALT 25 08/19/2019   PROT 6.5 08/19/2019   ALBUMIN 4.7 08/19/2019   CALCIUM 9.6 08/19/2019   GFR 78.45 08/19/2019   Lab Results  Component Value Date   CHOL 226 (H) 08/19/2019   Lab Results  Component Value Date   HDL 95.10 08/19/2019   Lab Results  Component Value Date   LDLCALC 116 (H) 08/19/2019   Lab Results  Component Value Date   TRIG 74.0 08/19/2019   Lab Results  Component Value Date   CHOLHDL 2 08/19/2019   No results found for:  HGBA1C     Assessment & Plan:   Problem List Items Addressed This Visit    None         Patricia Arnold, FNP

## 2019-11-25 ENCOUNTER — Encounter: Payer: Self-pay | Admitting: Family Medicine

## 2019-11-25 DIAGNOSIS — S70362A Insect bite (nonvenomous), left thigh, initial encounter: Secondary | ICD-10-CM

## 2019-11-25 DIAGNOSIS — W57XXXA Bitten or stung by nonvenomous insect and other nonvenomous arthropods, initial encounter: Secondary | ICD-10-CM

## 2019-11-25 MED ORDER — DOXYCYCLINE HYCLATE 100 MG PO TABS: 100.0000 mg | ORAL_TABLET | Freq: Two times a day (BID) | ORAL | 0 refills | Status: DC

## 2019-11-25 NOTE — Telephone Encounter (Signed)
Please see message and advise.  Thank you. ° °

## 2019-11-26 ENCOUNTER — Other Ambulatory Visit: Payer: Self-pay

## 2019-11-26 DIAGNOSIS — W57XXXA Bitten or stung by nonvenomous insect and other nonvenomous arthropods, initial encounter: Secondary | ICD-10-CM

## 2019-11-26 DIAGNOSIS — S70362A Insect bite (nonvenomous), left thigh, initial encounter: Secondary | ICD-10-CM

## 2019-11-26 MED ORDER — DOXYCYCLINE HYCLATE 100 MG PO TABS: 100.0000 mg | ORAL_TABLET | Freq: Two times a day (BID) | ORAL | 0 refills | Status: DC

## 2019-12-22 ENCOUNTER — Encounter: Payer: Self-pay | Admitting: Family Medicine

## 2019-12-22 DIAGNOSIS — F419 Anxiety disorder, unspecified: Secondary | ICD-10-CM

## 2019-12-22 DIAGNOSIS — K219 Gastro-esophageal reflux disease without esophagitis: Secondary | ICD-10-CM

## 2019-12-22 NOTE — Telephone Encounter (Signed)
Please advise.   Alprazolam 11/04/19  #30/0 Pantoprazole 08/17/19  #60/3 Doxycycline 11/26/19  #20/0

## 2019-12-23 MED ORDER — ALPRAZOLAM 0.25 MG PO TABS: 0.2500 mg | ORAL_TABLET | Freq: Two times a day (BID) | ORAL | 0 refills | Status: DC | PRN

## 2019-12-23 MED ORDER — PANTOPRAZOLE SODIUM 40 MG PO TBEC: 40.0000 mg | DELAYED_RELEASE_TABLET | Freq: Two times a day (BID) | ORAL | 1 refills | Status: DC

## 2019-12-24 ENCOUNTER — Telehealth: Payer: Self-pay | Admitting: Family Medicine

## 2019-12-24 NOTE — Telephone Encounter (Signed)
I called [place] back and clarified rx sig.

## 2019-12-24 NOTE — Telephone Encounter (Signed)
Pharmacist called to request clarification of instructions for alprazolam prescription. [phone].

## 2020-01-27 ENCOUNTER — Encounter: Payer: Self-pay | Admitting: Family Medicine

## 2020-01-27 NOTE — Telephone Encounter (Signed)
Please see message and advise.  Thank you. ° °

## 2020-02-07 ENCOUNTER — Other Ambulatory Visit: Payer: Self-pay | Admitting: Family Medicine

## 2020-02-07 DIAGNOSIS — F419 Anxiety disorder, unspecified: Secondary | ICD-10-CM

## 2020-02-07 NOTE — Telephone Encounter (Signed)
Last TV 11/15/19 w/[facility] Last fill 12/23/19  #30/0

## 2020-02-23 ENCOUNTER — Encounter: Payer: Self-pay | Admitting: Family Medicine

## 2020-02-24 ENCOUNTER — Other Ambulatory Visit: Payer: Self-pay

## 2020-02-25 ENCOUNTER — Ambulatory Visit (INDEPENDENT_AMBULATORY_CARE_PROVIDER_SITE_OTHER): Payer: Medicare PPO | Admitting: Family Medicine

## 2020-02-25 ENCOUNTER — Encounter: Payer: Self-pay | Admitting: Family Medicine

## 2020-02-25 VITALS — BP 112/80 | HR 78 | Temp 98.3°F | Ht 60.0 in | Wt 122.0 lb

## 2020-02-25 DIAGNOSIS — E785 Hyperlipidemia, unspecified: Secondary | ICD-10-CM | POA: Diagnosis not present

## 2020-02-25 DIAGNOSIS — Z0000 Encounter for general adult medical examination without abnormal findings: Secondary | ICD-10-CM | POA: Diagnosis not present

## 2020-02-25 DIAGNOSIS — F419 Anxiety disorder, unspecified: Secondary | ICD-10-CM

## 2020-02-25 DIAGNOSIS — E559 Vitamin D deficiency, unspecified: Secondary | ICD-10-CM

## 2020-02-25 DIAGNOSIS — Z23 Encounter for immunization: Secondary | ICD-10-CM | POA: Diagnosis not present

## 2020-02-25 DIAGNOSIS — K219 Gastro-esophageal reflux disease without esophagitis: Secondary | ICD-10-CM

## 2020-02-25 DIAGNOSIS — M8580 Other specified disorders of bone density and structure, unspecified site: Secondary | ICD-10-CM

## 2020-02-25 NOTE — Progress Notes (Signed)
Patricia Hampton is a 69 y.o. female  Chief Complaint  Patient presents with  . Annual Exam    Pt here for physical.     HPI: Patricia Hampton is a 69 y.o. female here for annual CPE, labs.   Last PAP: 06/2018 - normal  Last mammo: 07/2018 - due  Last Dexa: 07/2018 Last colonoscopy: 01/2019 - due in 01/2024 - Dr. Carlean Purl with [facility]  Dental: UTD Vision: UTD   Med refills needed today? none   Past Medical History:  Diagnosis Date  . Allergy   . Anxiety   . GERD (gastroesophageal reflux disease)   . Hyperlipidemia   . Osteopenia 02/19/2005  . Urinary incontinence     Past Surgical History:  Procedure Laterality Date  . bladder tack    . BREAST EXCISIONAL BIOPSY Left   . CESAREAN SECTION    . COLONOSCOPY  08/2006  . REDUCTION MAMMAPLASTY Bilateral 05/13/2017  . REPEAT CESAREAN SECTION      Social History   Socioeconomic History  . Marital status: Married    Spouse name: Not on file  . Number of children: 2  . Years of education: Not on file  . Highest education level: Not on file  Occupational History    Employer: [employer]  Tobacco Use  . Smoking status: Never Smoker  . Smokeless tobacco: Never Used  Vaping Use  . Vaping Use: Never used  Substance and Sexual Activity  . Alcohol use: Yes    Alcohol/week: 10.0 standard drinks    Types: 10 Glasses of wine per week    Comment: 8-10 glasses wine per week per pt  . Drug use: No  . Sexual activity: Yes  Other Topics Concern  . Not on file  Social History Narrative  . Not on file   Social Determinants of Health   Financial Resource Strain: Low Risk   . Difficulty of Paying Living Expenses: Not hard at all  Food Insecurity: No Food Insecurity  . Worried About Charity fundraiser in the Last Year: Never true  . Ran Out of Food in the Last Year: Never true  Transportation Needs: No Transportation Needs  . Lack of Transportation (Medical): No  . Lack of Transportation (Non-Medical): No   Physical Activity:   . Days of Exercise per Week:   . Minutes of Exercise per Session:   Stress:   . Feeling of Stress :   Social Connections:   . Frequency of Communication with Friends and Family:   . Frequency of Social Gatherings with Friends and Family:   . Attends Religious Services:   . Active Member of Clubs or Organizations:   . Attends Archivist Meetings:   Marland Kitchen Marital Status:   Intimate Partner Violence:   . Fear of Current or Ex-Partner:   . Emotionally Abused:   Marland Kitchen Physically Abused:   . Sexually Abused:     Family History  Problem Relation Age of Onset  . Hyperlipidemia Mother   . Stroke Father   . Hypertension Sister   . Depression Sister   . Alcohol abuse Sister   . Stroke Sister   . Hyperlipidemia Brother   . Cancer Brother        ESOPHAGEAL  . Esophageal cancer Brother   . Cancer Maternal Grandmother        breast  . Breast cancer Maternal Grandmother   . Diabetes Paternal Grandfather   . Diabetes Brother   .  Hypertension Brother   . Depression Brother   . Hyperlipidemia Brother   . Obesity Sister   . Colon cancer Neg Hx   . Colon polyps Neg Hx   . Rectal cancer Neg Hx   . Stomach cancer Neg Hx      Immunization History  Administered Date(s) Administered  . Fluad Quad(high Dose 65+) 04/05/2019  . Influenza,inj,Quad PF,6+ Mos 04/25/2015, 03/15/2017, 04/28/2018  . PFIZER SARS-COV-2 Vaccination 08/10/2019, 08/28/2019  . Pneumococcal Conjugate-13 06/22/2018  . Rabies, IM 02/22/2012, 02/25/2012, 02/29/2012, 03/07/2012  . Rabies, intradermal 02/22/2012  . Td 01/16/2005  . Tdap 09/30/2016  . Zoster Recombinat (Shingrix) 04/28/2018, 07/21/2018    Outpatient Encounter Medications as of 02/25/2020  Medication Sig  . ALPRAZolam (XANAX) 0.25 MG tablet TAKE 1 TO 2 TABLETS BY MOUTH EVERY 6 HOURS AS NEEDED FOR ANXIETY  . aspirin EC 81 MG tablet Take 1 tablet (81 mg total) by mouth daily. (Patient not taking: Reported on 11/15/2019)  . Biotin  1000 MCG tablet Take 1 tablet (1 mg total) by mouth daily.  . Calcium Carbonate-Vitamin D 600-400 MG-UNIT tablet Take 1 tablet by mouth 2 (two) times daily.  . celecoxib (CELEBREX) 100 MG capsule Take 1 capsule (100 mg total) by mouth daily.  . Cholecalciferol ([employer] VITAMIN D3) 1000 units capsule Take 1bid  . cyclobenzaprine (FLEXERIL) 5 MG tablet Take 1 tablet (5 mg total) by mouth 3 (three) times daily as needed for muscle spasms.  Marland Kitchen doxycycline (VIBRA-TABS) 100 MG tablet Take 1 tablet (100 mg total) by mouth 2 (two) times daily.  . Omega-3 Fatty Acids (FISH OIL) 1000 MG CAPS Take 2qd  . pantoprazole (PROTONIX) 40 MG tablet Take 1 tablet (40 mg total) by mouth 2 (two) times daily before a meal.  . rosuvastatin (CRESTOR) 10 MG tablet Take 1 tablet (10 mg total) by mouth daily.  Marland Kitchen spironolactone (ALDACTONE) 100 MG tablet TAKE 1 TABLET BY MOUTH EVERY DAY  . tretinoin (RETIN-A) 0.025 % cream Apply topically at bedtime.   No facility-administered encounter medications on file as of 02/25/2020.     ROS: Gen: no fever, chills  Skin: no rash, itching ENT: no ear pain, ear drainage, nasal congestion, rhinorrhea, sinus pressure, sore throat Eyes: no blurry vision, double vision Resp: no cough, wheeze,SOB CV: no CP, palpitations, LE edema,  GI: no heartburn, n/v/d/c, abd pain; + increased throat clearing x 2 wks GU: no dysuria, urgency, frequency, hematuria MSK: no joint pain, myalgias, back pain Neuro: no dizziness, headache, weakness, vertigo Psych: no depression, anxiety, insomnia   No Known Allergies  BP 112/80 (BP Location: Left Arm, Patient Position: Sitting, Cuff Size: Normal)   Pulse 78   Temp 98.3 F (36.8 C) (Temporal)   Ht 5' (1.524 m)   Wt 122 lb (55.3 kg)   SpO2 97%   BMI 23.83 kg/m   Physical Exam Constitutional:      General: She is not in acute distress.    Appearance: She is well-developed.  HENT:     Head: Normocephalic and atraumatic.     Right Ear: Tympanic  membrane and ear canal normal.     Left Ear: Tympanic membrane and ear canal normal.     Nose: Nose normal.  Eyes:     Conjunctiva/sclera: Conjunctivae normal.     Pupils: Pupils are equal, round, and reactive to light.  Neck:     Thyroid: No thyromegaly.  Cardiovascular:     Rate and Rhythm: Normal rate and regular rhythm.  Heart sounds: Normal heart sounds. No murmur heard.   Pulmonary:     Effort: Pulmonary effort is normal. No respiratory distress.     Breath sounds: Normal breath sounds. No wheezing or rhonchi.  Abdominal:     General: Bowel sounds are normal. There is no distension.     Palpations: Abdomen is soft. There is no mass.     Tenderness: There is no abdominal tenderness.  Musculoskeletal:     Cervical back: Neck supple.  Lymphadenopathy:     Cervical: No cervical adenopathy.  Skin:    General: Skin is warm and dry.  Neurological:     Mental Status: She is alert and oriented to person, place, and time.     Motor: No abnormal muscle tone.     Coordination: Coordination normal.  Psychiatric:        Behavior: Behavior normal.      A/P:  1. Annual physical exam - UTD on dental and vision - due for mammo; UTD on colonoscopy, PAP, dexa - discussed importance of regular CV exercise, healthy diet, adequate sleep - due for PSV23 then immunizations UTD - ALT; Future - AST; Future - Basic metabolic panel; Future - VITAMIN D 25 Hydroxy (Vit-D Deficiency, Fractures); Future - Lipid panel; Future - next CPE in 1 year  2. Hyperlipidemia, unspecified hyperlipidemia type - cont crestor 10mg  3 days/wk - Lipid panel; Future  3. Gastroesophageal reflux disease, unspecified whether esophagitis present - ? Uncontrolled - increased throat clearing x 2 wks - cont protonix 40mg  BID, add pepcid 20mg  daily x 2 wks - if no/minimal improvement, stop pepcid and f/u via mychart or phone  4. Osteopenia, unspecified location - dexa in 06/2019 - VITAMIN D 25 Hydroxy (Vit-D  Deficiency, Fractures); Future  5. Vitamin D deficiency - VITAMIN D 25 Hydroxy (Vit-D Deficiency, Fractures); Future  6. Anxiety - controlled on xanax 0.25-0.5mg  daily PRN, does not need a refill  7. Need for pneumococcal vaccine - Pneumococcal polysaccharide vaccine 23-valent greater than or equal to 2yo subcutaneous/IM   This visit occurred during the SARS-CoV-2 public health emergency.  Safety protocols were in place, including screening questions prior to the visit, additional usage of staff PPE, and extensive cleaning of exam room while observing appropriate contact time as indicated for disinfecting solutions.

## 2020-03-02 ENCOUNTER — Other Ambulatory Visit: Payer: Self-pay | Admitting: Family Medicine

## 2020-03-02 DIAGNOSIS — Z1231 Encounter for screening mammogram for malignant neoplasm of breast: Secondary | ICD-10-CM

## 2020-03-21 ENCOUNTER — Ambulatory Visit: Payer: Medicare PPO

## 2020-03-21 ENCOUNTER — Other Ambulatory Visit: Payer: Self-pay | Admitting: Family Medicine

## 2020-03-21 DIAGNOSIS — Z1231 Encounter for screening mammogram for malignant neoplasm of breast: Secondary | ICD-10-CM

## 2020-04-05 ENCOUNTER — Other Ambulatory Visit: Payer: Self-pay | Admitting: Family

## 2020-04-05 DIAGNOSIS — F419 Anxiety disorder, unspecified: Secondary | ICD-10-CM

## 2020-04-07 ENCOUNTER — Encounter: Payer: Self-pay | Admitting: Family Medicine

## 2020-04-07 DIAGNOSIS — F419 Anxiety disorder, unspecified: Secondary | ICD-10-CM

## 2020-04-07 MED ORDER — ALPRAZOLAM 0.25 MG PO TABS: 0.2500 mg | ORAL_TABLET | Freq: Every evening | ORAL | 2 refills | Status: DC | PRN

## 2020-04-10 MED ORDER — ALPRAZOLAM 0.25 MG PO TABS: 0.2500 mg | ORAL_TABLET | Freq: Every evening | ORAL | 2 refills | Status: DC | PRN

## 2020-04-10 NOTE — Telephone Encounter (Signed)
rx sent

## 2020-04-11 ENCOUNTER — Ambulatory Visit
Admission: RE | Admit: 2020-04-11 | Discharge: 2020-04-11 | Disposition: A | Discharge status: Home / Self Care | Payer: Medicare PPO | Source: Ambulatory Visit | Attending: Family Medicine | Admitting: Family Medicine

## 2020-04-11 ENCOUNTER — Other Ambulatory Visit: Payer: Self-pay

## 2020-04-11 DIAGNOSIS — Z1231 Encounter for screening mammogram for malignant neoplasm of breast: Secondary | ICD-10-CM

## 2020-04-12 ENCOUNTER — Ambulatory Visit: Payer: Medicare PPO

## 2020-04-20 ENCOUNTER — Other Ambulatory Visit: Payer: Self-pay | Admitting: Family Medicine

## 2020-04-25 ENCOUNTER — Other Ambulatory Visit (INDEPENDENT_AMBULATORY_CARE_PROVIDER_SITE_OTHER): Payer: Medicare PPO

## 2020-04-25 DIAGNOSIS — E785 Hyperlipidemia, unspecified: Secondary | ICD-10-CM

## 2020-04-25 DIAGNOSIS — E559 Vitamin D deficiency, unspecified: Secondary | ICD-10-CM

## 2020-04-25 DIAGNOSIS — M8580 Other specified disorders of bone density and structure, unspecified site: Secondary | ICD-10-CM | POA: Diagnosis not present

## 2020-04-25 DIAGNOSIS — Z0000 Encounter for general adult medical examination without abnormal findings: Secondary | ICD-10-CM

## 2020-04-25 LAB — LIPID PANEL
Cholesterol: 223 mg/dL — ABNORMAL HIGH (ref 0–200)
HDL: 95.7 mg/dL (ref >39.00)
LDL Cholesterol: 111 mg/dL — ABNORMAL HIGH (ref 0–99)
NonHDL: 127.06
Total CHOL/HDL Ratio: 2
Triglycerides: 81 mg/dL (ref 0.0–149.0)
VLDL: 16.2 mg/dL (ref 0.0–40.0)

## 2020-04-25 LAB — BASIC METABOLIC PANEL
BUN: 12 mg/dL (ref 6–23)
CO2: 29 mEq/L (ref 19–32)
Calcium: 9.3 mg/dL (ref 8.4–10.5)
Chloride: 101 mEq/L (ref 96–112)
Creatinine, Ser: 0.76 mg/dL (ref 0.40–1.20)
GFR: 81.19 mL/min (ref >60.00)
Glucose, Bld: 94 mg/dL (ref 70–99)
Potassium: 3.8 mEq/L (ref 3.5–5.1)
Sodium: 137 mEq/L (ref 135–145)

## 2020-04-25 LAB — VITAMIN D 25 HYDROXY (VIT D DEFICIENCY, FRACTURES): VITD: 37.5 ng/mL (ref 30.00–100.00)

## 2020-04-25 LAB — AST: AST: 27 U/L (ref 0–37)

## 2020-04-25 LAB — ALT: ALT: 31 U/L (ref 0–35)

## 2020-04-26 ENCOUNTER — Encounter: Payer: Self-pay | Admitting: Family Medicine

## 2020-05-11 ENCOUNTER — Encounter: Payer: Self-pay | Admitting: Family Medicine

## 2020-05-15 ENCOUNTER — Encounter: Payer: Self-pay | Admitting: Family Medicine

## 2020-05-15 NOTE — Telephone Encounter (Signed)
Last fill 04/13/19  #[id number]/[id number]

## 2020-05-17 MED ORDER — TRETINOIN 0.025 % EX CREA
TOPICAL_CREAM | Freq: Every day | CUTANEOUS | 11 refills | Status: DC
Start: 2020-05-17 — End: 2021-10-11

## 2020-05-22 ENCOUNTER — Encounter: Payer: Self-pay | Admitting: Family Medicine

## 2020-05-25 ENCOUNTER — Telehealth: Payer: Medicare PPO | Admitting: Family Medicine

## 2020-05-25 ENCOUNTER — Encounter: Payer: Self-pay | Admitting: Family Medicine

## 2020-05-25 ENCOUNTER — Telehealth (INDEPENDENT_AMBULATORY_CARE_PROVIDER_SITE_OTHER): Payer: Medicare PPO | Admitting: Family Medicine

## 2020-05-25 VITALS — Ht 60.0 in

## 2020-05-25 DIAGNOSIS — R21 Rash and other nonspecific skin eruption: Secondary | ICD-10-CM

## 2020-05-25 NOTE — Progress Notes (Signed)
Virtual Visit via Video Note  I connected with Patricia Hampton on 05/25/20 at  4:00 PM EDT by a video enabled telemedicine application and verified that I am speaking with the correct person using two identifiers. Location patient: home Location provider: work  Persons participating in the virtual visit: patient, provider  I discussed the limitations of evaluation and management by telemedicine and the availability of in person appointments. The patient expressed understanding and agreed to proceed.  Chief Complaint  Patient presents with  . Rash    concerns about red rash that she had on buttocks  a few days ago. Per patient rash have improved     HPI: Patricia Hampton is a 69 y.o. female who complains of recurrent rash that occurs 2-3x/year x years. Located on her Rt buttock that at times "tingles". No itch. Resolves on its won in 2-5 days. Rash is a raised area, unsure if vesicles or pustules or other. No fever, chills, fatigue. She does not currently have a rash.  She states she has attributed it to a spider bite but now wonders about herpes or other.    Past Medical History:  Diagnosis Date  . Allergy   . Anxiety   . GERD (gastroesophageal reflux disease)   . Hyperlipidemia   . Osteopenia 02/19/2005  . Urinary incontinence     Past Surgical History:  Procedure Laterality Date  . bladder tack    . BREAST EXCISIONAL BIOPSY Left   . CESAREAN SECTION    . COLONOSCOPY  08/2006  . REDUCTION MAMMAPLASTY Bilateral 05/13/2017  . REPEAT CESAREAN SECTION      Family History  Problem Relation Age of Onset  . Hyperlipidemia Mother   . Stroke Father   . Hypertension Sister   . Depression Sister   . Alcohol abuse Sister   . Stroke Sister   . Hyperlipidemia Brother   . Cancer Brother        ESOPHAGEAL  . Esophageal cancer Brother   . Cancer Maternal Grandmother        breast  . Breast cancer Maternal Grandmother   . Diabetes Paternal Grandfather   . Diabetes Brother    . Hypertension Brother   . Depression Brother   . Hyperlipidemia Brother   . Obesity Sister   . Colon cancer Neg Hx   . Colon polyps Neg Hx   . Rectal cancer Neg Hx   . Stomach cancer Neg Hx     Social History   Tobacco Use  . Smoking status: Never Smoker  . Smokeless tobacco: Never Used  Vaping Use  . Vaping Use: Never used  Substance Use Topics  . Alcohol use: Yes    Alcohol/week: 10.0 standard drinks    Types: 10 Glasses of wine per week    Comment: 8-10 glasses wine per week per pt  . Drug use: No     Current Outpatient Medications:  .  ALPRAZolam (XANAX) 0.25 MG tablet, Take 1 tablet (0.25 mg total) by mouth at bedtime as needed for anxiety., Disp: 30 tablet, Rfl: 2 .  Biotin 1000 MCG tablet, Take 1 tablet (1 mg total) by mouth daily., Disp: 90 tablet, Rfl: 3 .  Calcium Carbonate-Vitamin D 600-400 MG-UNIT tablet, Take 1 tablet by mouth 2 (two) times daily., Disp: 180 tablet, Rfl: 3 .  Omega-3 Fatty Acids (FISH OIL) 1000 MG CAPS, Take 2qd, Disp: 180 capsule, Rfl: 3 .  pantoprazole (PROTONIX) 40 MG tablet, Take 1 tablet (40 mg total)  by mouth 2 (two) times daily before a meal., Disp: 180 tablet, Rfl: 1 .  rosuvastatin (CRESTOR) 10 MG tablet, Take 1 tablet (10 mg total) by mouth daily., Disp: 90 tablet, Rfl: 3 .  spironolactone (ALDACTONE) 100 MG tablet, Take 1 tablet by mouth once daily, Disp: 90 tablet, Rfl: 1 .  tretinoin (RETIN-A) 0.025 % cream, Apply topically at bedtime., Disp: 45 g, Rfl: 11 .  aspirin EC 81 MG tablet, Take 1 tablet (81 mg total) by mouth daily. (Patient not taking: Reported on 05/25/2020), Disp: , Rfl:   No Known Allergies    ROS: See pertinent positives and negatives per HPI.   EXAM:  VITALS per patient if applicable: Ht 5' (9.417 m)   BMI 23.83 kg/m    GENERAL: alert, oriented, appears well and in no acute distress  NECK: normal movements of the head and neck  LUNGS: on inspection no signs of respiratory distress, breathing rate  appears normal, no obvious gross SOB, gasping or wheezing, no conversational dyspnea  CV: no obvious cyanosis  PSYCH/NEURO: pleasant and cooperative, speech and thought processing grossly intact   ASSESSMENT AND PLAN:  1. Rash and nonspecific skin eruption - recurs in same location (Rt buttock) 2-3x/year for years - HSV vs cyst vs other - pt currently asymptomatic so recommended she schedule in-person OV if/when rash recurs so exam and possible culture can be done - pt expressed understanding and all questions answered   I discussed the assessment and treatment plan with the patient. The patient was provided an opportunity to ask questions and all were answered. The patient agreed with the plan and demonstrated an understanding of the instructions.   The patient was advised to call back or seek an in-person evaluation if the symptoms worsen or if the condition fails to improve as anticipated.   Letta Median, DO

## 2020-05-26 ENCOUNTER — Encounter: Payer: Self-pay | Admitting: Family Medicine

## 2020-06-14 ENCOUNTER — Encounter: Payer: Self-pay | Admitting: Family Medicine

## 2020-06-14 NOTE — Telephone Encounter (Signed)
Please see message and advise.  Thank you. ° °

## 2020-06-22 ENCOUNTER — Encounter: Payer: Self-pay | Admitting: Family Medicine

## 2020-08-03 ENCOUNTER — Encounter: Payer: Self-pay | Admitting: Family Medicine

## 2020-08-09 ENCOUNTER — Encounter: Payer: Self-pay | Admitting: Family Medicine

## 2020-08-12 ENCOUNTER — Other Ambulatory Visit: Payer: Self-pay

## 2020-08-12 DIAGNOSIS — K219 Gastro-esophageal reflux disease without esophagitis: Secondary | ICD-10-CM

## 2020-08-12 DIAGNOSIS — F419 Anxiety disorder, unspecified: Secondary | ICD-10-CM

## 2020-08-12 MED ORDER — PANTOPRAZOLE SODIUM 40 MG PO TBEC: 40.0000 mg | DELAYED_RELEASE_TABLET | Freq: Two times a day (BID) | ORAL | 1 refills | Status: DC

## 2020-08-12 NOTE — Telephone Encounter (Signed)
Pt pharmacy has been updated and these medications have been requested due to no more refills available.

## 2020-08-15 MED ORDER — ROSUVASTATIN CALCIUM 10 MG PO TABS
10.0000 mg | ORAL_TABLET | Freq: Every day | ORAL | 3 refills | Status: DC
Start: 2020-08-15 — End: 2021-03-30

## 2020-08-15 MED ORDER — ALPRAZOLAM 0.25 MG PO TABS: 0.2500 mg | ORAL_TABLET | Freq: Every evening | ORAL | 2 refills | Status: DC | PRN

## 2020-08-25 ENCOUNTER — Encounter: Payer: Self-pay | Admitting: Family Medicine

## 2020-08-25 DIAGNOSIS — Z780 Asymptomatic menopausal state: Secondary | ICD-10-CM

## 2020-08-25 DIAGNOSIS — M8580 Other specified disorders of bone density and structure, unspecified site: Secondary | ICD-10-CM

## 2020-08-28 ENCOUNTER — Encounter: Payer: Self-pay | Admitting: Family Medicine

## 2020-08-29 ENCOUNTER — Ambulatory Visit (INDEPENDENT_AMBULATORY_CARE_PROVIDER_SITE_OTHER): Payer: Medicare PPO

## 2020-08-29 VITALS — Ht 60.0 in | Wt 119.0 lb

## 2020-08-29 DIAGNOSIS — Z0000 Encounter for general adult medical examination without abnormal findings: Secondary | ICD-10-CM | POA: Diagnosis not present

## 2020-08-29 NOTE — Progress Notes (Signed)
Subjective:   Patricia Hampton is a 70 y.o. female who presents for Medicare Annual (Subsequent) preventive examination.  I connected with  [facility] today by a video enabled telemedicine application and verified that I am speaking with the correct person using two identifiers.  Location of patient:Home Location of provider:Work  Persons participating in the virtual visit: patient, nurse.   I discussed the limitations, risk, security and privacy concerns of evaluation and management by telemedicine. The patient expressed understanding and agreed to proceed.  Some vital signs may be absent or patient reported.   Review of Systems     Cardiac Risk Factors include: advanced age (>55men, >7 women);dyslipidemia     Objective:    Today's Vitals   08/29/20 1507  Weight: 119 lb (54 kg)  Height: 5' (1.524 m)   Body mass index is 23.24 kg/m.  Advanced Directives 08/29/2020 08/18/2019  Does Patient Have a Medical Advance Directive? Yes Yes  Type of Paramedic of [place];Living will [place];Living will  Does patient want to make changes to medical advance directive? - No - Patient declined  Copy of [place] in Chart? No - copy requested No - copy requested    Current Medications (verified) Outpatient Encounter Medications as of 08/29/2020  Medication Sig  . ALPRAZolam (XANAX) 0.25 MG tablet Take 1 tablet (0.25 mg total) by mouth at bedtime as needed for anxiety.  . Biotin 1000 MCG tablet Take 1 tablet (1 mg total) by mouth daily.  . Calcium Carbonate-Vitamin D 600-400 MG-UNIT tablet Take 1 tablet by mouth 2 (two) times daily.  . Omega-3 Fatty Acids (FISH OIL) 1000 MG CAPS Take 2qd  . pantoprazole (PROTONIX) 40 MG tablet Take 1 tablet (40 mg total) by mouth 2 (two) times daily before a meal.  . rosuvastatin (CRESTOR) 10 MG tablet Take 1 tablet (10 mg total) by mouth daily.  Marland Kitchen spironolactone (ALDACTONE) 100 MG tablet  Take 1 tablet by mouth once daily  . tretinoin (RETIN-A) 0.025 % cream Apply topically at bedtime.  Marland Kitchen aspirin EC 81 MG tablet Take 1 tablet (81 mg total) by mouth daily. (Patient not taking: No sig reported)   No facility-administered encounter medications on file as of 08/29/2020.    Allergies (verified) Patient has no known allergies.   History: Past Medical History:  Diagnosis Date  . Allergy   . Anxiety   . GERD (gastroesophageal reflux disease)   . Hyperlipidemia   . Osteopenia 02/19/2005  . Urinary incontinence    Past Surgical History:  Procedure Laterality Date  . bladder tack    . BREAST EXCISIONAL BIOPSY Left   . CESAREAN SECTION    . COLONOSCOPY  08/2006  . REDUCTION MAMMAPLASTY Bilateral 05/13/2017  . REPEAT CESAREAN SECTION     Family History  Problem Relation Age of Onset  . Hyperlipidemia Mother   . Stroke Father   . Hypertension Sister   . Depression Sister   . Alcohol abuse Sister   . Stroke Sister   . Hyperlipidemia Brother   . Cancer Brother        ESOPHAGEAL  . Esophageal cancer Brother   . Cancer Maternal Grandmother        breast  . Breast cancer Maternal Grandmother   . Diabetes Paternal Grandfather   . Diabetes Brother   . Hypertension Brother   . Depression Brother   . Hyperlipidemia Brother   . Obesity Sister   . Colon cancer  Neg Hx   . Colon polyps Neg Hx   . Rectal cancer Neg Hx   . Stomach cancer Neg Hx    Social History   Socioeconomic History  . Marital status: Married    Spouse name: Not on file  . Number of children: 2  . Years of education: Not on file  . Highest education level: Not on file  Occupational History    Employer: [employer]  Tobacco Use  . Smoking status: Never Smoker  . Smokeless tobacco: Never Used  Vaping Use  . Vaping Use: Never used  Substance and Sexual Activity  . Alcohol use: Yes    Alcohol/week: 10.0 standard drinks    Types: 10 Glasses of wine per week    Comment: 8-10 glasses  wine per week per pt  . Drug use: No  . Sexual activity: Yes  Other Topics Concern  . Not on file  Social History Narrative  . Not on file   Social Determinants of Health   Financial Resource Strain: Low Risk   . Difficulty of Paying Living Expenses: Not hard at all  Food Insecurity: No Food Insecurity  . Worried About Charity fundraiser in the Last Year: Never true  . Ran Out of Food in the Last Year: Never true  Transportation Needs: No Transportation Needs  . Lack of Transportation (Medical): No  . Lack of Transportation (Non-Medical): No  Physical Activity: Sufficiently Active  . Days of Exercise per Week: 7 days  . Minutes of Exercise per Session: 60 min  Stress: No Stress Concern Present  . Feeling of Stress : Not at all  Social Connections: Socially Integrated  . Frequency of Communication with Friends and Family: More than three times a week  . Frequency of Social Gatherings with Friends and Family: More than three times a week  . Attends Religious Services: More than 4 times per year  . Active Member of Clubs or Organizations: Yes  . Attends Archivist Meetings: Never  . Marital Status: Married    Tobacco Counseling Counseling given: Not Answered   Clinical Intake:  Pre-visit preparation completed: Yes  Pain : No/denies pain     Nutritional Status: BMI of 19-24  Normal Nutritional Risks: None Diabetes: No  How often do you need to have someone help you when you read instructions, pamphlets, or other written materials from your doctor or pharmacy?: 1 - Never  Diabetic?No  Interpreter Needed?: No  Information entered by :: Caroleen Hamman LPN   Activities of Daily Living In your present state of health, do you have any difficulty performing the following activities: 08/29/2020  Hearing? N  Vision? N  Difficulty concentrating or making decisions? N  Walking or climbing stairs? N  Dressing or bathing? N  Doing errands, shopping? N   Preparing Food and eating ? N  Using the Toilet? N  In the past six months, have you accidently leaked urine? N  Do you have problems with loss of bowel control? N  Managing your Medications? N  Managing your Finances? N  Housekeeping or managing your Housekeeping? N  Some recent data might be hidden    Patient Care Team: Ronnald Nian, DO as PCP - General (Family Medicine)  Indicate any recent Medical Services you may have received from other than [facility] providers in the past year (date may be approximate).     Assessment:   This is a routine wellness examination for [place].  Hearing/Vision  screen  Hearing Screening   125Hz  250Hz  500Hz  1000Hz  2000Hz  3000Hz  4000Hz  6000Hz  8000Hz   Right ear:           Left ear:           Comments: Mild hearing loss  Vision Screening Comments: Wears one contact for reading Last eye exam-2020-[place] issues and exercise activities discussed: Current Exercise Habits: Home exercise routine, Type of exercise: strength training/weights;walking, Time (Minutes): 60, Frequency (Times/Week): 5, Weekly Exercise (Minutes/Week): 300, Intensity: Mild, Exercise limited by: None identified  Goals    . Maintain healthy active lifestyle.    . Patient Stated     Drink more water       Depression Screen PHQ 2/9 Scores 08/29/2020 08/18/2019 08/17/2019 06/22/2018 05/06/2017 02/22/2015  PHQ - 2 Score 0 0 0 0 0 0    Fall Risk Fall Risk  08/29/2020 08/18/2019 08/17/2019 06/22/2018 05/06/2017  Falls in the past year? 0 0 0 0 No  Number falls in past yr: 0 0 - - -  Injury with Fall? 0 0 - - -  Follow up Falls prevention discussed Education provided;Falls prevention discussed Falls evaluation completed Falls evaluation completed -    FALL RISK PREVENTION PERTAINING TO THE HOME:  Any stairs in or around the home? Yes  If so, are there any without handrails? No  Home free of loose throw rugs in walkways, pet beds, electrical cords, etc? Yes   Adequate lighting in your home to reduce risk of falls? Yes   ASSISTIVE DEVICES UTILIZED TO PREVENT FALLS:  Life alert? No  Use of a cane, walker or w/c? No  Grab bars in the bathroom? No  Shower chair or bench in shower? Yes  Elevated toilet seat or a handicapped toilet? No   TIMED UP AND GO:  Was the test performed? No . Phone visit   Cognitive Function:Normal cognitive status assessed by direct observation by this Nurse Health Advisor. No abnormalities found.          Immunizations Immunization History  Administered Date(s) Administered  . Fluad Quad(high Dose 65+) 04/05/2019  . Influenza,inj,Quad PF,6+ Mos 04/25/2015, 03/15/2017, 04/28/2018  . Influenza-Unspecified 06/19/2020  . PFIZER(Purple Top)SARS-COV-2 Vaccination 08/10/2019, 08/28/2019, 05/16/2020  . Pneumococcal Conjugate-13 06/22/2018  . Pneumococcal Polysaccharide-23 02/25/2020  . Rabies, IM 02/22/2012, 02/25/2012, 02/29/2012, 03/07/2012  . Rabies, intradermal 02/22/2012  . Td 01/16/2005  . Tdap 09/30/2016  . Zoster Recombinat (Shingrix) 04/28/2018, 07/21/2018    TDAP status: Up to date  Flu Vaccine status: Up to date  Pneumococcal vaccine status: Up to date  Covid-19 vaccine status: Completed vaccines  Qualifies for Shingles Vaccine? No   Zostavax completed Yes   Shingrix Completed?: Yes  Screening Tests Health Maintenance  Topic Date Due  . MAMMOGRAM  04/11/2021  . COLONOSCOPY (Pts 45-79yrs Insurance coverage will need to be confirmed)  02/10/2024  . TETANUS/TDAP  10/01/2026  . INFLUENZA VACCINE  Completed  . DEXA SCAN  Completed  . COVID-19 Vaccine  Completed  . Hepatitis C Screening  Completed  . PNA vac Low Risk Adult  Completed    Health Maintenance  There are no preventive care reminders to display for this patient.  Colorectal cancer screening: Type of screening: Colonoscopy. Completed 02/10/2019. Repeat every 5 years  Mammogram status: Completed Bilateral 04/11/2020. Repeat  every year  Bone Density status: Scheduled for 01/2021  Lung Cancer Screening: (Low Dose CT Chest recommended if Age 63-80 years, 30 pack-year currently smoking OR have quit w/in  15years.) does not qualify.    Additional Screening:  Hepatitis C Screening:  Completed 02/28/2016  Vision Screening: Recommended annual ophthalmology exams for early detection of glaucoma and other disorders of the eye. Is the patient up to date with their annual eye exam?  No  Who is the provider or what is the name of the office in which the patient attends annual eye exams? [facility]  Patient to schedule an appt soon  Dental Screening: Recommended annual dental exams for proper oral hygiene  Community Resource Referral / Chronic Care Management: CRR required this visit?  No   CCM required this visit?  No      Plan:     I have personally reviewed and noted the following in the patient's chart:   . Medical and social history . Use of alcohol, tobacco or illicit drugs  . Current medications and supplements . Functional ability and status . Nutritional status . Physical activity . Advanced directives . List of other physicians . Hospitalizations, surgeries, and ER visits in previous 12 months . Vitals . Screenings to include cognitive, depression, and falls . Referrals and appointments  In addition, I have reviewed and discussed with patient certain preventive protocols, quality metrics, and best practice recommendations. A written personalized care plan for preventive services as well as general preventive health recommendations were provided to patient.   Due to this being a video visit, the after visit summary with patients personalized plan was offered to patient via mail or my-chart.  Patient would like to access on my-chart.   Marta Antu, LPN   12/21/8364  Nurse Health Advisor  Nurse Notes: None

## 2020-08-29 NOTE — Patient Instructions (Signed)
Patricia Hampton , Thank you for taking time to comeplete your Medicare Wellness Visit. I appreciate your ongoing commitment to your health goals. Please review the following plan we discussed and let me know if I can assist you in the future.   Screening recommendations/referrals: Colonoscopy: Completed 02/10/2019-Due-02/10/2024 Mammogram: Completed 04/11/2020-Due-04/11/2021 Bone Density: Completed 07/29/2018-Scheduled for 01/2021 Recommended yearly ophthalmology/optometry visit for glaucoma screening and checkup Recommended yearly dental visit for hygiene and checkup  Vaccinations: Influenza vaccine: Up to date Pneumococcal vaccine: Completed vaccines Tdap vaccine: Up to date-Due-10/01/226 Shingles vaccine: Completed vaccines  Covid-19:Completed vaccines  Advanced directives: Please bring a copy for your chart  Conditions/risks identified: See problem list  Next appointment: Follow up in one year for your annual wellness visit    Preventive Care 65 Years and Older, Female Preventive care refers to lifestyle choices and visits with your health care provider that can promote health and wellness. What does preventive care include?  A yearly physical exam. This is also called an annual well check.  Dental exams once or twice a year.  Routine eye exams. Ask your health care provider how often you should have your eyes checked.  Personal lifestyle choices, including:  Daily care of your teeth and gums.  Regular physical activity.  Eating a healthy diet.  Avoiding tobacco and drug use.  Limiting alcohol use.  Practicing safe sex.  Taking low-dose aspirin every day.  Taking vitamin and mineral supplements as recommended by your health care provider. What happens during an annual well check? The services and screenings done by your health care provider during your annual well check will depend on your age, overall health, lifestyle risk factors, and family history of  disease. Counseling  Your health care provider may ask you questions about your:  Alcohol use.  Tobacco use.  Drug use.  Emotional well-being.  Home and relationship well-being.  Sexual activity.  Eating habits.  History of falls.  Memory and ability to understand (cognition).  Work and work Statistician.  Reproductive health. Screening  You may have the following tests or measurements:  Height, weight, and BMI.  Blood pressure.  Lipid and cholesterol levels. These may be checked every 5 years, or more frequently if you are over 42 years old.  Skin check.  Lung cancer screening. You may have this screening every year starting at age 59 if you have a 30-pack-year history of smoking and currently smoke or have quit within the past 15 years.  Fecal occult blood test (FOBT) of the stool. You may have this test every year starting at age 53.  Flexible sigmoidoscopy or colonoscopy. You may have a sigmoidoscopy every 5 years or a colonoscopy every 10 years starting at age 35.  Hepatitis C blood test.  Hepatitis B blood test.  Sexually transmitted disease (STD) testing.  Diabetes screening. This is done by checking your blood sugar (glucose) after you have not eaten for a while (fasting). You may have this done every 1-3 years.  Bone density scan. This is done to screen for osteoporosis. You may have this done starting at age 27.  Mammogram. This may be done every 1-2 years. Talk to your health care provider about how often you should have regular mammograms. Talk with your health care provider about your test results, treatment options, and if necessary, the need for more tests. Vaccines  Your health care provider may recommend certain vaccines, such as:  Influenza vaccine. This is recommended every year.  Tetanus, diphtheria, and acellular pertussis (  Tdap, Td) vaccine. You may need a Td booster every 10 years.  Zoster vaccine. You may need this after age  14.  Pneumococcal 13-valent conjugate (PCV13) vaccine. One dose is recommended after age 68.  Pneumococcal polysaccharide (PPSV23) vaccine. One dose is recommended after age 64. Talk to your health care provider about which screenings and vaccines you need and how often you need them. This information is not intended to replace advice given to you by your health care provider. Make sure you discuss any questions you have with your health care provider. Document Released: 08/04/2015 Document Revised: 03/27/2016 Document Reviewed: 05/09/2015 Elsevier Interactive Patient Education  2017 [place] Prevention in the Home Falls can cause injuries. They can happen to people of all ages. There are many things you can do to make your home safe and to help prevent falls. What can I do on the outside of my home?  Regularly fix the edges of walkways and driveways and fix any cracks.  Remove anything that might make you trip as you walk through a door, such as a raised step or threshold.  Trim any bushes or trees on the path to your home.  Use bright outdoor lighting.  Clear any walking paths of anything that might make someone trip, such as rocks or tools.  Regularly check to see if handrails are loose or broken. Make sure that both sides of any steps have handrails.  Any raised decks and porches should have guardrails on the edges.  Have any leaves, snow, or ice cleared regularly.  Use sand or salt on walking paths during winter.  Clean up any spills in your garage right away. This includes oil or grease spills. What can I do in the bathroom?  Use night lights.  Install grab bars by the toilet and in the tub and shower. Do not use towel bars as grab bars.  Use non-skid mats or decals in the tub or shower.  If you need to sit down in the shower, use a plastic, non-slip stool.  Keep the floor dry. Clean up any water that spills on the floor as soon as it happens.  Remove  soap buildup in the tub or shower regularly.  Attach bath mats securely with double-sided non-slip rug tape.  Do not have throw rugs and other things on the floor that can make you trip. What can I do in the bedroom?  Use night lights.  Make sure that you have a light by your bed that is easy to reach.  Do not use any sheets or blankets that are too big for your bed. They should not hang down onto the floor.  Have a firm chair that has side arms. You can use this for support while you get dressed.  Do not have throw rugs and other things on the floor that can make you trip. What can I do in the kitchen?  Clean up any spills right away.  Avoid walking on wet floors.  Keep items that you use a lot in easy-to-reach places.  If you need to reach something above you, use a strong step stool that has a grab bar.  Keep electrical cords out of the way.  Do not use floor polish or wax that makes floors slippery. If you must use wax, use non-skid floor wax.  Do not have throw rugs and other things on the floor that can make you trip. What can I do with my stairs?  Do  not leave any items on the stairs.  Make sure that there are handrails on both sides of the stairs and use them. Fix handrails that are broken or loose. Make sure that handrails are as long as the stairways.  Check any carpeting to make sure that it is firmly attached to the stairs. Fix any carpet that is loose or worn.  Avoid having throw rugs at the top or bottom of the stairs. If you do have throw rugs, attach them to the floor with carpet tape.  Make sure that you have a light switch at the top of the stairs and the bottom of the stairs. If you do not have them, ask someone to add them for you. What else can I do to help prevent falls?  Wear shoes that:  Do not have high heels.  Have rubber bottoms.  Are comfortable and fit you well.  Are closed at the toe. Do not wear sandals.  If you use a  stepladder:  Make sure that it is fully opened. Do not climb a closed stepladder.  Make sure that both sides of the stepladder are locked into place.  Ask someone to hold it for you, if possible.  Clearly mark and make sure that you can see:  Any grab bars or handrails.  First and last steps.  Where the edge of each step is.  Use tools that help you move around (mobility aids) if they are needed. These include:  Canes.  Walkers.  Scooters.  Crutches.  Turn on the lights when you go into a dark area. Replace any light bulbs as soon as they burn out.  Set up your furniture so you have a clear path. Avoid moving your furniture around.  If any of your floors are uneven, fix them.  If there are any pets around you, be aware of where they are.  Review your medicines with your doctor. Some medicines can make you feel dizzy. This can increase your chance of falling. Ask your doctor what other things that you can do to help prevent falls. This information is not intended to replace advice given to you by your health care provider. Make sure you discuss any questions you have with your health care provider. Document Released: 05/04/2009 Document Revised: 12/14/2015 Document Reviewed: 08/12/2014 Elsevier Interactive Patient Education  2017 [employer].

## 2020-09-01 ENCOUNTER — Encounter: Payer: Self-pay | Admitting: Family Medicine

## 2020-09-01 DIAGNOSIS — E785 Hyperlipidemia, unspecified: Secondary | ICD-10-CM

## 2020-09-01 DIAGNOSIS — Z8249 Family history of ischemic heart disease and other diseases of the circulatory system: Secondary | ICD-10-CM

## 2020-10-03 ENCOUNTER — Encounter: Payer: Self-pay | Admitting: Family Medicine

## 2020-10-03 NOTE — Telephone Encounter (Signed)
Pt scheduled for a VV with Dr. Gena Fray tomorrow, as she is out of town today.

## 2020-10-03 NOTE — Telephone Encounter (Signed)
Please see message and advise.  Thank you. ° °

## 2020-10-04 ENCOUNTER — Telehealth (INDEPENDENT_AMBULATORY_CARE_PROVIDER_SITE_OTHER): Payer: Medicare PPO | Admitting: Family Medicine

## 2020-10-04 ENCOUNTER — Encounter: Payer: Self-pay | Admitting: Family Medicine

## 2020-10-04 VITALS — Ht 60.0 in | Wt 119.0 lb

## 2020-10-04 DIAGNOSIS — M542 Cervicalgia: Secondary | ICD-10-CM | POA: Diagnosis not present

## 2020-10-04 DIAGNOSIS — M25511 Pain in right shoulder: Secondary | ICD-10-CM

## 2020-10-04 NOTE — Progress Notes (Signed)
[facility] [address] [place] [address] Dept: [phone] Dept Fax: [phone]  Virtual Video Visit  I connected with [name] on 10/04/20 at  8:00 AM EDT by a video enabled telemedicine application and verified that I am speaking with the correct person using two identifiers.  Location patient: Home Location provider: Clinic Persons participating in the virtual visit: Patient, her husband, and Provider  I discussed the limitations of evaluation and management by telemedicine and the availability of in person appointments. The patient expressed understanding and agreed to proceed.  Chief Complaint  Patient presents with  . Acute Visit    C/o having pain in neck, RT arm, and back x 1 week.  She has been taking Ibuprofen and muscle relaxer with little relief.  No known injury.     SUBJECTIVE:  HPI: Patricia Hampton is a 70 y.o. female who presents with a 1 week history fo right upper arm pain that progressed into the right shoulder and neck. She also notes some pain down through the area between the shoulder blades. She does not recall any particular inciting event. She was involve din moving a large bed, but notes she had a minor role in this. She denies any numbness or tingling in the right hand and no weakness int eh right arm. She has been using some ibuprofen. She notes she used this more consistently yesterday and did get better relief, though the pain returned as the medication wore off. She took some Flexeril she had on hand, but does not feel this helped much. She took a Xanax for sleep, but still had some difficulty. She also takes melatonin and wonders if this is okay for sleep.  Ms. Willig also had questions about having a CT for coronary artery calcification. She has a strong family history of early heart disease. She has a personal history of hyperlipidemia and is on rosuvastatin. Her husband had  this test ordered by his PCP and was found to have a calcified lymph node in the chest. She apparently previously raised this issue with Dr. Bryan Lemma who had recommended a cardiology referral for consideration, but she had not pursued that.  Patient Active Problem List   Diagnosis Date Noted  . Hot flashes due to menopause 08/17/2019  . GERD (gastroesophageal reflux disease) 08/16/2019  . Hair loss 11/19/2018  . Back problem 02/01/2016  . Osteopenia 04/25/2015  . History of postmenopausal HRT 04/25/2015  . FH: stroke 02/03/2014  . Family history of carotid artery stenosis 02/03/2014  . Allergic rhinitis 04/21/2008  . HLD (hyperlipidemia) 04/09/2007  . Diverticulosis of large intestine 08/22/2006  . Osteopenia after menopause 02/19/2005   Past Surgical History:  Procedure Laterality Date  . bladder tack    . BREAST EXCISIONAL BIOPSY Left   . CESAREAN SECTION    . COLONOSCOPY  08/2006  . REDUCTION MAMMAPLASTY Bilateral 05/13/2017  . REPEAT CESAREAN SECTION     Family History  Problem Relation Age of Onset  . Hyperlipidemia Mother   . Stroke Father   . Hypertension Sister   . Depression Sister   . Alcohol abuse Sister   . Stroke Sister   . Hyperlipidemia Brother   . Cancer Brother        ESOPHAGEAL  . Esophageal cancer Brother   . Cancer Maternal Grandmother        breast  . Breast cancer Maternal Grandmother   . Diabetes Paternal Grandfather   . Diabetes  Brother   . Hypertension Brother   . Depression Brother   . Hyperlipidemia Brother   . Obesity Sister   . Colon cancer Neg Hx   . Colon polyps Neg Hx   . Rectal cancer Neg Hx   . Stomach cancer Neg Hx    Social History   Tobacco Use  . Smoking status: Never Smoker  . Smokeless tobacco: Never Used  Vaping Use  . Vaping Use: Never used  Substance Use Topics  . Alcohol use: Yes    Alcohol/week: 10.0 standard drinks    Types: 10 Glasses of wine per week    Comment: 8-10 glasses wine per week per pt  . Drug  use: No    Current Outpatient Medications:  .  ALPRAZolam (XANAX) 0.25 MG tablet, Take 1 tablet (0.25 mg total) by mouth at bedtime as needed for anxiety., Disp: 30 tablet, Rfl: 2 .  Biotin 1000 MCG tablet, Take 1 tablet (1 mg total) by mouth daily., Disp: 90 tablet, Rfl: 3 .  Calcium Carbonate-Vitamin D 600-400 MG-UNIT tablet, Take 1 tablet by mouth 2 (two) times daily., Disp: 180 tablet, Rfl: 3 .  cyclobenzaprine (FLEXERIL) 5 MG tablet, , Disp: , Rfl:  .  Omega-3 Fatty Acids (FISH OIL) 1000 MG CAPS, Take 2qd, Disp: 180 capsule, Rfl: 3 .  pantoprazole (PROTONIX) 40 MG tablet, Take 1 tablet (40 mg total) by mouth 2 (two) times daily before a meal., Disp: 180 tablet, Rfl: 1 .  rosuvastatin (CRESTOR) 10 MG tablet, Take 1 tablet (10 mg total) by mouth daily., Disp: 90 tablet, Rfl: 3 .  spironolactone (ALDACTONE) 100 MG tablet, Take 1 tablet by mouth once daily, Disp: 90 tablet, Rfl: 1 .  tretinoin (RETIN-A) 0.025 % cream, Apply topically at bedtime., Disp: 45 g, Rfl: 11 .  aspirin EC 81 MG tablet, Take 1 tablet (81 mg total) by mouth daily. (Patient not taking: No sig reported), Disp: , Rfl:   No Known Allergies  ROS: See pertinent positives and negatives per HPI.  OBSERVATIONS/OBJECTIVE:  VITALS per patient if applicable: Today's Vitals   10/04/20 0803  Weight: 119 lb (54 kg)  Height: 5' (1.524 m)   Body mass index is 23.24 kg/m.   GENERAL: Alert, oriented, appears well and in no acute distress.  NECK: Normal movements of the head and neck.  MS: FROM of the right glenohumeral joint. No noticeable abnormality.  PSYCH/NEURO: Pleasant and cooperative, no obvious depression or anxiety, speech and thought processing grossly intact.  ASSESSMENT AND PLAN:  1. Acute pain of right shoulder 2. Neck pain, acute  I recommended she use ibuprofen 600 mg qid for 7 days and apoply heat to the areas of pain. If not improved, she should come for an in-person appointment, to allow for more  thorough physical examination.  I also recommended she consider a cardiology assessment, as the CT for CAC is one of several tests that might be considered for evaluating cardiac risk and potentially changing her management. I attempted to explain that ordering the tests on every patient is not indicated, as it may not always impact the management plan and can find benign conditions leading to unnecessary follow-up or treatments.   I discussed the assessment and treatment plan with the patient. The patient was provided an opportunity to ask questions and all were answered. The patient agreed with the plan and demonstrated an understanding of the instructions.   The patient was advised to call back or seek an in-person evaluation  if the symptoms worsen or if the condition fails to improve as anticipated.   Haydee Salter, MD

## 2020-10-04 NOTE — Patient Instructions (Signed)
Recommend heat to areas of pain. Recommend using ibuprofen 200 mg, 3 tablets four times a day for 1 week.

## 2020-10-08 ENCOUNTER — Encounter: Payer: Self-pay | Admitting: Family Medicine

## 2020-10-09 ENCOUNTER — Telehealth (INDEPENDENT_AMBULATORY_CARE_PROVIDER_SITE_OTHER): Payer: Medicare PPO | Admitting: Family Medicine

## 2020-10-09 ENCOUNTER — Encounter: Payer: Self-pay | Admitting: Family Medicine

## 2020-10-09 VITALS — BP 131/68 | HR 74 | Temp 98.4°F | Ht 60.0 in | Wt 122.0 lb

## 2020-10-09 DIAGNOSIS — M25511 Pain in right shoulder: Secondary | ICD-10-CM

## 2020-10-09 DIAGNOSIS — M545 Low back pain, unspecified: Secondary | ICD-10-CM | POA: Diagnosis not present

## 2020-10-09 DIAGNOSIS — M542 Cervicalgia: Secondary | ICD-10-CM

## 2020-10-09 DIAGNOSIS — M25521 Pain in right elbow: Secondary | ICD-10-CM

## 2020-10-09 MED ORDER — PREDNISONE 20 MG PO TABS: ORAL_TABLET | ORAL | 0 refills | Status: DC

## 2020-10-09 MED ORDER — PREDNISONE 20 MG PO TABS
ORAL_TABLET | ORAL | 0 refills | Status: DC
Start: 2020-10-09 — End: 2021-03-14

## 2020-10-09 NOTE — Progress Notes (Signed)
Phone [phone] Virtual visit via Video note   Subjective:  Chief complaint: Chief Complaint  Patient presents with  . Pain    In her neck, Right arm, back and head. Patient states that she's been dealing with this pain since 09/28/2020     This visit type was conducted due to national recommendations for restrictions regarding the COVID-19 Pandemic (e.g. social distancing).  This format is felt to be most appropriate for this patient at this time balancing risks to patient and risks to population by having him in for in person visit.  No physical exam was performed (except for noted visual exam or audio findings with Telehealth visits).    Our team/I connected with [name] at 10:40 AM EDT by a video enabled telemedicine application (doxy.me or caregility through epic) and verified that I am speaking with the correct person using two identifiers.  Location patient: Home[place] Location provider: [facility], office Persons participating in the virtual visit:  patient  Our team/I discussed the limitations of evaluation and management by telemedicine and the availability of in person appointments. In light of current covid-19 pandemic, patient also understands that we are trying to protect them by minimizing in office contact if at all possible.  The patient expressed consent for telemedicine visit and agreed to proceed. Patient understands insurance will be billed.   Past Medical History-  Patient Active Problem List   Diagnosis Date Noted  . Hot flashes due to menopause 08/17/2019  . GERD (gastroesophageal reflux disease) 08/16/2019  . Hair loss 11/19/2018  . Back problem 02/01/2016  . Osteopenia 04/25/2015  . History of postmenopausal HRT 04/25/2015  . FH: stroke 02/03/2014  . Family history of carotid artery stenosis 02/03/2014  . Allergic rhinitis 04/21/2008  . HLD (hyperlipidemia) 04/09/2007  . Diverticulosis of large intestine 08/22/2006  . Osteopenia after  menopause 02/19/2005    Medications- reviewed and updated Current Outpatient Medications  Medication Sig Dispense Refill  . ALPRAZolam (XANAX) 0.25 MG tablet Take 1 tablet (0.25 mg total) by mouth at bedtime as needed for anxiety. 30 tablet 2  . Biotin 1000 MCG tablet Take 1 tablet (1 mg total) by mouth daily. 90 tablet 3  . Calcium Carbonate-Vitamin D 600-400 MG-UNIT tablet Take 1 tablet by mouth 2 (two) times daily. 180 tablet 3  . cyclobenzaprine (FLEXERIL) 5 MG tablet     . Omega-3 Fatty Acids (FISH OIL) 1000 MG CAPS Take 2qd 180 capsule 3  . pantoprazole (PROTONIX) 40 MG tablet Take 1 tablet (40 mg total) by mouth 2 (two) times daily before a meal. 180 tablet 1  . rosuvastatin (CRESTOR) 10 MG tablet Take 1 tablet (10 mg total) by mouth daily. 90 tablet 3  . spironolactone (ALDACTONE) 100 MG tablet Take 1 tablet by mouth once daily 90 tablet 1  . tretinoin (RETIN-A) 0.025 % cream Apply topically at bedtime. 45 g 11  . aspirin EC 81 MG tablet Take 1 tablet (81 mg total) by mouth daily. (Patient not taking: No sig reported)    . predniSONE (DELTASONE) 20 MG tablet Take 2 pills for 3 days, 1 pill for 4 days 10 tablet 0   No current facility-administered medications for this visit.     Objective:  BP 131/68   Pulse 74   Temp 98.4 F (36.9 C) (Temporal)   Ht 5' (1.524 m)   Wt 122 lb (55.3 kg)   BMI 23.83 kg/m  self reported vitals Gen: NAD, resting comfortably Lungs: nonlabored, normal  respiratory rate  Skin: appears dry, no obvious rash No worsening of pain when twisting her neck to the right or left     Assessment and Plan   # Right arm/shoulder/neck pain S:Dr. Gena Fray saw patient 10/04/20 and her pain started a week prior. Started with right arm pain above right elbow like someone was poking her for about a week. Has also been having a hard time sleeping since about the 11th. Pain had also gone up to shoulder on right side and then base of her neck and then back into the upper  neck and occasionally some between shoulder blades.  Dr. Gena Fray recommended 3 ibuprofen every 6 hours (600mg ) and she has been doing this without relief. Also has tried muscle relaxant. Also noting some pain in back of her head for 1-2 weeks- started after shoulder and neck pain. Pain does not go down to the hand. Still with good range of motion. No numbness or tingling. Not dropping objects and does not feel weak in hand on right side. No blurry vision or double vision. Prior to these issues had noted needed updated eye exam- not related to current issue and not worsened since shoulder/neck ain started. Pain at worst up to 9/10- can get as low as 4-5/10.   Left lower back also with some pain last night like someone is pushing hard on that area. This pain is above 7-8/10.   Movement helps pain. No falls or injury.   X-ray of cervical spine 02/16/18 with Dr. Deborra Medina showed "Disc degeneration and spondylosis C4-5 and C5-6. No acute Abnormality."  History of uveitis and an odd tendon inflammation with no clear cause in the past- possible steroids in past.  A/P: 70 year old female with 2 to 3 weeks of right elbow/upper arm/neck/shoulder pain.  Denies numbness or tingling or weakness or worsening pain pattern.  Also with some recent left low back pain but she has been sleeping very poorly.  She has been on ibuprofen 600 mg every 6 hours as per suggested by Dr. Gena Fray.  In case this was a cervical radiculopathy-which is certainly possible given prior x-ray results-we opted to trial prednisone for 7 days.  We will also refer her to sports medicine and hope she can get in this week.  Hoping she has some improvement with prednisone within 2 days.  We also discussed possible physical therapy but wanted to start with sports medicine. -He also has some Flexeril available and I recommended trying this before bed.  She has not been sleeping well with alprazolam anyway so I advised she simply hold that while using the  cyclobenzaprine -She should alert [place] if has new or worsening symptoms -Would consider short course of tramadol if not able to get into sports medicine this week  Patient is also worried about potential cardiac risk or neurological issues.  Since primary issue is pain related on the right side as well as left low back pain we thought sports medicine was a reasonable place to start.  I told her for cardiac issues-challenging to fully evaluate without in person exam-she has previously been referred to cardiology on 10/04/2020 by her PCP and I recommended she follow through with this in regards to cardiac concerns-if she had new or worsening symptoms particular exertional chest pain or if her current symptoms became exertional (please note she actually feels better sometimes with movement) and she should seek care immediately.  Recommended follow up: As needed for acute concerns Future Appointments  Date Time Provider  [place]  01/30/2021  2:00 PM [facility] DX DEXA 1 [facility]    Lab/Order associations:   ICD-10-CM   1. Acute pain of right shoulder  M25.511 Ambulatory referral to Sports Medicine  2. Right elbow pain  M25.521 Ambulatory referral to Sports Medicine  3. Neck pain on right side  M54.2 Ambulatory referral to Sports Medicine  4. Acute left-sided low back pain without sciatica  M54.50 Ambulatory referral to Sports Medicine   Meds ordered this encounter  Medications  . DISCONTD: predniSONE (DELTASONE) 20 MG tablet    Sig: Take 2 pills for 3 days, 1 pill for 4 days    Dispense:  10 tablet    Refill:  0  . predniSONE (DELTASONE) 20 MG tablet    Sig: Take 2 pills for 3 days, 1 pill for 4 days    Dispense:  10 tablet    Refill:  0    Return precautions advised.  Garret Reddish, MD

## 2020-10-09 NOTE — Patient Instructions (Addendum)
  Depression screen [facility] 2/9 08/29/2020 08/18/2019 08/17/2019  Decreased Interest 0 0 0  Down, Depressed, Hopeless 0 0 0  PHQ - 2 Score 0 0 0    Recommended follow up: No follow-ups on file.

## 2020-10-10 ENCOUNTER — Encounter: Payer: Self-pay | Admitting: Family Medicine

## 2020-10-10 ENCOUNTER — Other Ambulatory Visit: Payer: Self-pay

## 2020-10-10 ENCOUNTER — Ambulatory Visit (INDEPENDENT_AMBULATORY_CARE_PROVIDER_SITE_OTHER): Payer: Medicare PPO | Admitting: Family Medicine

## 2020-10-10 VITALS — BP 130/82 | HR 64 | Ht 60.0 in | Wt 124.8 lb

## 2020-10-10 DIAGNOSIS — M545 Low back pain, unspecified: Secondary | ICD-10-CM | POA: Diagnosis not present

## 2020-10-10 DIAGNOSIS — M542 Cervicalgia: Secondary | ICD-10-CM

## 2020-10-10 MED ORDER — TIZANIDINE HCL 4 MG PO TABS: 2.0000 mg | ORAL_TABLET | Freq: Four times a day (QID) | ORAL | 1 refills | Status: DC | PRN

## 2020-10-10 NOTE — Telephone Encounter (Signed)
Patient called following up. She said that she is planning to go to the pharmacy around 445 and was hoping it would be there by then?  Please advise.

## 2020-10-10 NOTE — Progress Notes (Signed)
I, Wendy Poet, LAT, ATC, am serving as scribe for Dr. Lynne Leader.  Subjective:    I'm seeing this patient as a consultation for Dr. Garret Reddish. Note will be routed back to referring provider/PCP.  CC: Neck and R arm pain and L-sided LBP  HPI: Pt is a 70 y/o female c/o back and R-sided neck/shoulder pain. Pt reports neck/shoulder pain x approximately 2 weeks w/ no known MOI.  Pt locates pain to R neck and into R shoulder and upper arm.  She was seen virtually by Dr. Yong Channel yesterday for these c/o and prescribed a prednisone dose pack.  The neck shoulder and arm pain are improved spontaneously over the last few days.  The left low back pain is bothersome and problematic.  No chest pain or exertional chest pain or component.  No shortness of breath with exertion.  Neck pain: yes- R-side Radiates: yes into R shoulder and upper arm UE Numbness/tingling: No UE Weakness: no Aggravates: Being still / at rest; at night when trying to sleep Treatments tried: IBU 600mg ; prednisone dose pack; Flexeril  Dx imaging: 02/16/18 C-spine XR   Past medical history, Surgical history, Family history, Social history, Allergies, and medications have been entered into the medical record, reviewed.   Review of Systems: No new headache, visual changes, nausea, vomiting, diarrhea, constipation, dizziness, abdominal pain, skin rash, fevers, chills, night sweats, weight loss, swollen lymph nodes, body aches, joint swelling, muscle aches, chest pain, shortness of breath, mood changes, visual or auditory hallucinations.   Objective:    Vitals:   10/10/20 1247  BP: 130/82  Pulse: 64  SpO2: 97%   General: Well Developed, well nourished, and in no acute distress.  Neuro/Psych: Alert and oriented x3, extra-ocular muscles intact, able to move all 4 extremities, sensation grossly intact. Skin: Warm and dry, no rashes noted.  Respiratory: Not using accessory muscles, speaking in full sentences, trachea  midline.  Cardiovascular: Pulses palpable, no extremity edema. Abdomen: Does not appear distended. MSK: C-spine normal.  Nontender midline or paraspinal musculature.  Normal cervical motion. Additionally strength reflexes and sensation are equal normal throughout. Shoulder motion normal bilaterally intact strength negative impingement testing.  L-spine normal-appearing normal motion to extension rotation and lateral flexion limited flexion.  Tender palpation left lumbar paraspinal musculature nontender midline. Extremity strength reflexes and sensation are equal and normal throughout bilaterally.    Impression and Recommendations:    Assessment and Plan: 70 y.o. female with left lumbar paraspinal muscle spasm and dysfunction.  Plan for physical therapy and recheck back as needed.  Neck pain with arm pain now resolved.  Unclear etiology.  Could be muscle spasm and dysfunction or even cervical radiculopathy.  Difficult to tell since she is better now.  Watchful waiting.  Limited tizanidine for now for the back pain which may be helpful.  Could consider gabapentin as well if needed.Marland Kitchen  PDMP not reviewed this encounter. Orders Placed This Encounter  Procedures  . Ambulatory referral to Physical Therapy    Referral Priority:   Routine    Referral Type:   Physical Medicine    Referral Reason:   Specialty Services Required    Requested Specialty:   Physical Therapy   Meds ordered this encounter  Medications  . tiZANidine (ZANAFLEX) 4 MG tablet    Sig: Take 0.5 tablets (2 mg total) by mouth every 6 (six) hours as needed for muscle spasms.    Dispense:  30 tablet    Refill:  1  Discussed warning signs or symptoms. Please see discharge instructions. Patient expresses understanding.   The above documentation has been reviewed and is accurate and complete Lynne Leader, M.D.

## 2020-10-10 NOTE — Patient Instructions (Signed)
Thank you for coming in today.  Use heat Try a TENS unit.  I've referred you to Physical Therapy.  Let us know if you don't hear from them in one week.  Use the tizanidine muscle relaxer. If not improving or having trouble sleeping let me know. I can try other medicines.   TENS UNIT: This is helpful for muscle pain and spasm.   Search and Purchase a TENS 7000 2nd edition at  www.tenspros.com or www.[employer].com It should be less than $30.     TENS unit instructions: Do not shower or bathe with the unit on . Turn the unit off before removing electrodes or batteries . If the electrodes lose stickiness add a drop of water to the electrodes after they are disconnected from the unit and place on plastic sheet. If you continued to have difficulty, call the TENS unit company to purchase more electrodes. . Do not apply lotion on the skin area prior to use. Make sure the skin is clean and dry as this will help prolong the life of the electrodes. . After use, always check skin for unusual red areas, rash or other skin difficulties. If there are any skin problems, does not apply electrodes to the same area. . Never remove the electrodes from the unit by pulling the wires. . Do not use the TENS unit or electrodes other than as directed. . Do not change electrode placement without consultating your therapist or physician. Marland Kitchen Keep 2 fingers with between each electrode. . Wear time ratio is 2:1, on to off times.    For example on for 30 minutes off for 15 minutes and then on for 30 minutes off for 15 minutes    Lumbosacral Strain Lumbosacral strain is an injury that causes pain in the lower back (lumbosacral spine). This injury usually happens from overstretching the muscles or ligaments along your spine. Ligaments are cord-like tissues that connect bones to other bones. A strain can affect one or more muscles or ligaments. What are the causes? This condition may be caused by:  A hard, direct hit  to the back.  Overstretching the lower back muscles. This may result from: ? A fall. ? Lifting something heavy. ? Repetitive movements such as bending or crouching. What increases the risk? The following factors may make you more likely to develop this condition:  Participating in sports or activities that involve: ? A sudden twist of the back. ? Pushing or pulling motions.  Being overweight or obese.  Having poor strength and flexibility, especially tight hamstrings or weak muscles in the back or abdomen.  Having too much of a curve in the lower back.  Having a pelvis that is tilted forward. What are the signs or symptoms? The main symptom of this condition is pain in the lower back, at the site of the strain. Pain may also be felt down one or both legs. How is this diagnosed? This condition is diagnosed based on your symptoms, your medical history, and a physical exam. During the physical exam, your health care provider may push on certain areas of your back to find the source of your pain. You may be asked to bend forward, backward, and side to side to check your pain and range of motion. You may also have imaging tests, such as X-rays and an MRI. How is this treated? This condition may be treated by:  Applying heat and cold on the affected area.  Taking medicines to help relieve pain and relax  your muscles.  Taking NSAIDs, such as ibuprofen, to help reduce swelling and discomfort.  Doing stretching and strengthening exercises for your lower back. Symptoms usually improve within several weeks of treatment. However, recovery time varies. When your symptoms improve, gradually return to your normal routine as soon as possible to reduce pain, avoid stiffness, and keep muscle strength. Follow these instructions at home: Medicines  Take over-the-counter and prescription medicines only as told by your health care provider.  Ask your health care provider if the medicine prescribed  to you: ? Requires you to avoid driving or using heavy machinery. ? Can cause constipation. You may need to take these actions to prevent or treat constipation:  Drink enough fluid to keep your urine pale yellow.  Take over-the-counter or prescription medicines.  Eat foods that are high in fiber, such as beans, whole grains, and fresh fruits and vegetables.  Limit foods that are high in fat and processed sugars, such as fried or sweet foods. Managing pain, stiffness, and swelling  If directed, put ice on the injured area. To do this: ? Put ice in a plastic bag. ? Place a towel between your skin and the bag. ? Leave the ice on for 20 minutes, 2-3 times a day.  If directed, apply heat on the affected area as often as told by your health care provider. Use the heat source that your health care provider recommends, such as a moist heat pack or a heating pad. ? Place a towel between your skin and the heat source. ? Leave the heat on for 20-30 minutes. ? Remove the heat if your skin turns bright red. This is especially important if you are unable to feel pain, heat, or cold. You may have a greater risk of getting burned.      Activity  Rest as told by your health care provider.  Do not stay in bed. Staying in bed for more than 1-2 days can delay your recovery.  Return to your normal activities as told by your health care provider. Ask your health care provider what activities are safe for you.  Avoid activities that take a lot of energy for as long as told by your health care provider.  Do exercises as told by your health care provider. This includes stretching and strengthening exercises. General instructions  Sit up and stand up straight. Avoid leaning forward when you sit, or hunching over when you stand.  Do not use any products that contain nicotine or tobacco, such as cigarettes, e-cigarettes, and chewing tobacco. If you need help quitting, ask your health care  provider.  Keep all follow-up visits as told by your health care provider. This is important. How is this prevented?  Use correct form when playing sports and lifting heavy objects.  Use good posture when sitting and standing.  Maintain a healthy weight.  Sleep on a mattress with medium firmness to support your back.  Do at least 150 minutes of moderate-intensity exercise each week, such as brisk walking or water aerobics. Try a form of exercise that takes stress off your back, such as swimming or stationary cycling.  Maintain physical fitness, including: ? Strength. ? Flexibility.   Contact a health care provider if:  Your back pain does not improve after several weeks of treatment.  Your symptoms get worse. Get help right away if:  Your back pain is severe.  You cannot stand or walk.  You have difficulty controlling when you urinate or when you have  a bowel movement.  You feel nauseous or you vomit.  Your feet or legs get very cold, turn pale, or look blue.  You have numbness, tingling, weakness, or problems using your arms or legs.  You develop any of the following: ? Shortness of breath. ? Dizziness. ? Pain in your legs. ? Weakness in your buttocks or legs. Summary  Lumbosacral strain is an injury that causes pain in the lower back (lumbosacral spine).  This injury usually happens from overstretching the muscles or ligaments along your spine.  This condition may be caused by a direct hit to the lower back or by overstretching the lower back muscles.  Symptoms usually improve within several weeks of treatment. This information is not intended to replace advice given to you by your health care provider. Make sure you discuss any questions you have with your health care provider. Document Revised: 12/01/2018 Document Reviewed: 12/01/2018 Elsevier Patient Education  [place].

## 2020-10-11 ENCOUNTER — Encounter: Payer: Self-pay | Admitting: Family Medicine

## 2020-10-16 ENCOUNTER — Encounter: Payer: Self-pay | Admitting: Rehabilitative and Restorative Service Providers"

## 2020-10-16 ENCOUNTER — Ambulatory Visit (INDEPENDENT_AMBULATORY_CARE_PROVIDER_SITE_OTHER): Payer: Medicare PPO | Admitting: Rehabilitative and Restorative Service Providers"

## 2020-10-16 ENCOUNTER — Other Ambulatory Visit: Payer: Self-pay

## 2020-10-16 DIAGNOSIS — M6281 Muscle weakness (generalized): Secondary | ICD-10-CM

## 2020-10-16 DIAGNOSIS — M542 Cervicalgia: Secondary | ICD-10-CM | POA: Diagnosis not present

## 2020-10-16 DIAGNOSIS — M545 Low back pain, unspecified: Secondary | ICD-10-CM

## 2020-10-16 NOTE — Therapy (Addendum)
[facility] [place], [place], [address] Phone: [phone]   Fax:  [phone]  Physical Therapy Evaluation/Discharge  Patient Details  Name: Patricia Hampton MRN: [id number] Date of Birth: [birth year] Referring Provider (PT): Dr. Lynne Leader   Encounter Date: 10/16/2020   Referring diagnosis? M54.50, M54.2 Treatment diagnosis? (if different than referring diagnosis)  M54.50 What was this (referring dx) caused by? []  Surgery []  Fall [x]  Ongoing issue []  Arthritis []  Other: ____________  Laterality: []  Rt [x]  Lt []  Both  Check all possible CPT codes:      [x]  97110 (Therapeutic Exercise)  []  92507 (SLP Treatment)  [x]  97112 (Neuro Re-ed)   []  92526 (Swallowing Treatment)   [x]  97116 (Gait Training)   []  85277 (Cognitive Training, 1st 15 minutes) [x]  97140 (Manual Therapy)   []  97130 (Cognitive Training, each add'l 15 minutes)  [x]  97530 (Therapeutic Activities)  []  Other, List CPT Code ____________    [x]  97535 (Self Care)       []  All codes above (97110 - 97535)  [x]  97012 (Mechanical Traction)  [x]  97014 (E-stim Unattended)  []  97032 (E-stim manual)  [x]  97033 (Ionto)  [x]  97035 (Ultrasound)  []  97760 (Orthotic Fit) [x]  97750 (Physical Performance Training) []  [id number] (Aquatic Therapy) []  97034 (Contrast Bath) []  [id number] (Paraffin) []  97597 (Wound Care 1st 20 sq cm) []  97598 (Wound Care each add'l 20 sq cm) []  97016 (Vasopneumatic Device) []  [id number] Comptroller) []  [id number] (Prosthetic Training)    PT End of Session - 10/16/20 1507     Visit Number 1    Number of Visits 12    Date for PT Re-Evaluation 12/11/20    Authorization Type [employer] - asking for 12 until 12/11/2020    Progress Note Due on Visit 10    PT Start Time 1515    PT Stop Time 1542    PT Time Calculation (min) 27 min    Activity Tolerance Patient tolerated treatment well    Behavior During Therapy [facility] for tasks assessed/performed              Past Medical History:  Diagnosis Date   Allergy    Anxiety    GERD (gastroesophageal reflux disease)    Hyperlipidemia    Osteopenia 02/19/2005   Urinary incontinence     Past Surgical History:  Procedure Laterality Date   bladder tack     BREAST EXCISIONAL BIOPSY Left    CESAREAN SECTION     COLONOSCOPY  08/2006   REDUCTION MAMMAPLASTY Bilateral 05/13/2017   REPEAT CESAREAN SECTION      There were no vitals filed for this visit.    Subjective Assessment - 10/16/20 1514     Subjective Pt. stated onset of symptoms around March 8.  Pt. indicated insidious onset of Rt arm, neck pain and ultimately back of neck/head as well.  Pt. stated feeling pain settle into Lt low back.  Pt. stated neck pain is gone.  Lt Lower back symptoms still noted in last week or so.    Pertinent History Osteopenia, GERD, hyperlipidemia    Patient Stated Goals Reduce pain, sleep, return to gym    Currently in Pain? Yes    Pain Score 5    at worst   Pain Location Back    Pain Orientation Lower    Pain Descriptors / Indicators Other (Comment)   annoying   Pain Type Acute pain    Pain Onset 1 to 4 weeks ago  Pain Frequency Intermittent    Aggravating Factors  nighttime sleeping/rest    Pain Relieving Factors Medicine, TENS    Effect of Pain on Daily Activities Able to continue activity with less complaints in activity but still noted at night, exercise routine return.                [facility] PT Assessment - 10/16/20 0001       Assessment   Medical Diagnosis Neck pain, Low back pain    Referring Provider (PT) Dr. Lynne Leader    Onset Date/Surgical Date 09/26/20    Hand Dominance Right      Precautions   Precautions None      Balance Screen   Has the patient fallen in the past 6 months No    Has the patient had a decrease in activity level because of a fear of falling?  No    Is the patient reluctant to leave their home because of a fear of falling?  No      Home Social research officer, government residence    Additional Comments has stairs to non master bedroom rooms      Prior Function   Level of Independence Independent    Leisure [place], walking, exercise class, outdoor activity      Cognition   Overall Cognitive Status Within Functional Limits for tasks assessed      Observation/Other Assessments   Focus on Therapeutic Outcomes (FOTO)  68 % intake, goal 79%      Sensation   Light Touch Appears Intact      ROM / Strength   AROM / PROM / Strength Strength;PROM;AROM      AROM   AROM Assessment Site Cervical;Lumbar    Lumbar Flexion to toes, no complaints    Lumbar Extension 50% WFL no complaints    Lumbar - Right Side Bend to knee jt no complaints    Lumbar - Left Side Bend to knee jt no complaints      Strength   Overall Strength Comments Lumbar flexion supine 4/5    Strength Assessment Site Shoulder;Hip;Knee;Ankle    Right/Left Shoulder Left;Right    Right/Left Hip Left;Right    Right Hip Flexion 5/5    Left Hip Flexion 5/5    Right/Left Knee Left;Right    Right Knee Flexion 5/5    Right Knee Extension 5/5    Left Knee Flexion 5/5    Left Knee Extension 5/5    Right/Left Ankle Left;Right    Right Ankle Dorsiflexion 5/5    Left Ankle Dorsiflexion 5/5      Palpation   Spinal mobility Mild restriction L4, L5 cPA, no complaints    Palpation comment Mild tenderness Lt QL      Special Tests   Other special tests (-) slump bilateral, crossed slr bilateral                        Objective measurements completed on examination: See above findings.       [place] Adult PT Treatment/Exercise - 10/16/20 0001       Exercises   Exercises Other Exercises;Lumbar    Other Exercises  HEP instruction/performance c cues for techniques, handout provided.  Trial set performed of each for comprehension and symptom assessment.                    PT Education - 10/16/20 1507     Education Details HEP,  POC    Person(s)  Educated Patient    Methods Explanation;Demonstration;Verbal cues;Handout    Comprehension Returned demonstration;Verbalized understanding              PT Short Term Goals - 10/16/20 1507       PT SHORT TERM GOAL #1   Title Patient will demonstrate independent use of home exercise program to maintain progress from in clinic treatments.    Time 3    Period Weeks    Status New    Target Date 11/06/20               PT Long Term Goals - 10/16/20 1508       PT LONG TERM GOAL #1   Title Patient will demonstrate/report pain at worst less than or equal to 2/10 to facilitate minimal limitation in daily activity secondary to pain symptoms.    Time 8    Period Weeks    Status New    Target Date 12/11/20      PT LONG TERM GOAL #2   Title Patient will demonstrate independent use of home exercise program to facilitate ability to maintain/progress functional gains from skilled physical therapy services.    Time 8    Period Weeks    Status New    Target Date 12/11/20      PT LONG TERM GOAL #3   Title Patient will demonstrate lumbar extension 100 % WFL s symptoms to facilitate upright standing, walking posture at PLOF s limitation.    Time 8    Period Weeks    Status New    Target Date 12/11/20      PT LONG TERM GOAL #4   Title FOTO > or = 79 to indicated reduced disability due to condition.    Time 8    Period Weeks    Status New    Target Date 12/11/20      PT LONG TERM GOAL #5   Title Pt. will demonstrate lumbar flexion MMT 5/5 throughout to facilitate ability to perform usual activity s restriction.    Time 8    Period Weeks    Status New    Target Date 12/11/20      Additional Long Term Goals   Additional Long Term Goals Yes      PT LONG TERM GOAL #6   Title Pt. will demonstrate ability to sleep through night s symptoms.    Time 8    Period Weeks    Status New    Target Date 12/11/20                    Plan - 10/16/20 1610     Clinical  Impression Statement Patient is a 70 y.o. who comes to clinic with complaints of low back pain and recent neck pain with mobility, strength and movement coordination deficits that impair their ability to perform usual daily and recreational functional activities without increase difficulty/symptoms at this time.  Patient to benefit from skilled PT services to address impairments and limitations to improve to previous level of function without restriction secondary to condition.    Personal Factors and Comorbidities Comorbidity 3+    Comorbidities Osteopenia, GERD, hyperlipidemia    Examination-Activity Limitations Sleep;Other   exercise routine   Examination-Participation Restrictions Community Activity;Other   exercise   Stability/Clinical Decision Making Stable/Uncomplicated    Clinical Decision Making Low    Rehab Potential Good    PT Frequency 2x / week  PT Duration 8 weeks    PT Treatment/Interventions ADLs/Self Care Home Management;Cryotherapy;Electrical Stimulation;Iontophoresis 87m/ml Dexamethasone;Moist Heat;Traction;Balance training;Therapeutic exercise;Therapeutic activities;Functional mobility training;Stair training;Gait training;DME Instruction;Ultrasound;Neuromuscular re-education;Patient/family education;Passive range of motion;Dry needling;Manual techniques    PT Next Visit Plan Review HEP ,possible DN?    PT Home Exercise Plan [email or web address]    Consulted and Agree with Plan of Care Patient             Patient will benefit from skilled therapeutic intervention in order to improve the following deficits and impairments:  Decreased endurance,Pain,Decreased mobility,Decreased range of motion,Impaired perceived functional ability,Impaired flexibility,Decreased coordination,Decreased activity tolerance  Visit Diagnosis: Acute left-sided low back pain without sciatica  Cervicalgia  Muscle weakness (generalized)     Problem List Patient Active Problem List   Diagnosis Date  Noted   Hot flashes due to menopause 08/17/2019   GERD (gastroesophageal reflux disease) 08/16/2019   Hair loss 11/19/2018   Back problem 02/01/2016   Osteopenia 04/25/2015   History of postmenopausal HRT 04/25/2015   FH: stroke 02/03/2014   Family history of carotid artery stenosis 02/03/2014   Allergic rhinitis 04/21/2008   HLD (hyperlipidemia) 04/09/2007   Diverticulosis of large intestine 08/22/2006   Osteopenia after menopause 02/19/2005    MScot Jun PT, DPT, OCS, ATC 10/16/20  4:28 PM  PHYSICAL THERAPY DISCHARGE SUMMARY  Visits from Start of Care: 1  Current functional level related to goals / functional outcomes: See note   Remaining deficits: See note   Education / Equipment: HEP   Patient agrees to discharge. Patient goals were not met. Patient is being discharged due to not returning since the last visit.  MScot Jun PT, DPT, OCS, ATC 01/02/21  3:41 PM     [facility] [address] [place] [address]Phone: [phone]  Fax:  [phone] Name: Patricia TEMKINMRN: [id number] of Birth: [birth year]

## 2020-10-16 NOTE — Patient Instructions (Signed)
Access Code: [email or web address] URL: [email or web address] Date: 10/16/2020 Prepared by: Scot Jun  Exercises Sidelying Lumbar Rotation Stretch - 2-3 x daily - 7 x weekly - 1 sets - 5 reps - 15 hold Supine Lower Trunk Rotation - 2 x daily - 7 x weekly - 1 sets - 5 reps - 15 hold Supine Bridge - 2 x daily - 7 x weekly - 3 sets - 10 reps - 2 hold Plank on Knees - 1 x daily - 7 x weekly - 1 sets - 5 reps Standing Lumbar Extension - 2 x daily - 7 x weekly - 2-3 sets - 10 reps

## 2020-10-17 ENCOUNTER — Other Ambulatory Visit: Payer: Self-pay | Admitting: Family Medicine

## 2020-10-17 NOTE — Telephone Encounter (Signed)
Please advise 

## 2020-10-30 ENCOUNTER — Other Ambulatory Visit: Payer: Self-pay | Admitting: Family Medicine

## 2020-10-31 ENCOUNTER — Encounter: Payer: Medicare PPO | Admitting: Rehabilitative and Restorative Service Providers"

## 2020-11-13 ENCOUNTER — Other Ambulatory Visit: Payer: Self-pay | Admitting: Family Medicine

## 2020-11-13 NOTE — Telephone Encounter (Signed)
Rx refill request approved per Dr. Corey's orders. 

## 2020-11-14 ENCOUNTER — Encounter: Payer: Medicare PPO | Admitting: Rehabilitative and Restorative Service Providers"

## 2020-11-14 DIAGNOSIS — H18413 Arcus senilis, bilateral: Secondary | ICD-10-CM | POA: Diagnosis not present

## 2020-11-14 DIAGNOSIS — H25043 Posterior subcapsular polar age-related cataract, bilateral: Secondary | ICD-10-CM | POA: Diagnosis not present

## 2020-11-14 DIAGNOSIS — H2511 Age-related nuclear cataract, right eye: Secondary | ICD-10-CM | POA: Diagnosis not present

## 2020-11-14 DIAGNOSIS — H25013 Cortical age-related cataract, bilateral: Secondary | ICD-10-CM | POA: Diagnosis not present

## 2020-11-14 DIAGNOSIS — H2513 Age-related nuclear cataract, bilateral: Secondary | ICD-10-CM | POA: Diagnosis not present

## 2020-12-04 DIAGNOSIS — H2511 Age-related nuclear cataract, right eye: Secondary | ICD-10-CM | POA: Diagnosis not present

## 2020-12-04 DIAGNOSIS — H25811 Combined forms of age-related cataract, right eye: Secondary | ICD-10-CM | POA: Diagnosis not present

## 2020-12-04 DIAGNOSIS — H52201 Unspecified astigmatism, right eye: Secondary | ICD-10-CM | POA: Diagnosis not present

## 2020-12-15 ENCOUNTER — Other Ambulatory Visit: Payer: Self-pay | Admitting: Family Medicine

## 2020-12-15 DIAGNOSIS — F419 Anxiety disorder, unspecified: Secondary | ICD-10-CM

## 2020-12-20 ENCOUNTER — Encounter: Payer: Self-pay | Admitting: Family Medicine

## 2020-12-27 ENCOUNTER — Other Ambulatory Visit: Payer: Self-pay

## 2020-12-27 ENCOUNTER — Ambulatory Visit
Admission: RE | Admit: 2020-12-27 | Discharge: 2020-12-27 | Disposition: A | Discharge status: Home / Self Care | Payer: Medicare PPO | Source: Ambulatory Visit | Attending: Family Medicine | Admitting: Family Medicine

## 2020-12-27 DIAGNOSIS — Z780 Asymptomatic menopausal state: Secondary | ICD-10-CM | POA: Diagnosis not present

## 2020-12-27 DIAGNOSIS — M8580 Other specified disorders of bone density and structure, unspecified site: Secondary | ICD-10-CM

## 2020-12-27 DIAGNOSIS — M8589 Other specified disorders of bone density and structure, multiple sites: Secondary | ICD-10-CM | POA: Diagnosis not present

## 2021-01-05 ENCOUNTER — Encounter: Payer: Self-pay | Admitting: Family Medicine

## 2021-01-05 NOTE — Telephone Encounter (Signed)
Please see message and advise.  Thank you. ° °

## 2021-01-17 NOTE — Progress Notes (Signed)
Cardiology Office Note   Date:  01/18/2021   ID:  Toshia, Larkin March 28, 1951, MRN [id number]  PCP:  Ronnald Nian, DO  Cardiologist:   None Referring:  Ronnald Nian, DO  Chief Complaint  Patient presents with   Dyslipidemia       History of Present Illness: Patricia Hampton is a 70 y.o. female who presents for evaluation of a history of early CAD n her family.  She does not have a history of heart disease.  However, she has a very strong family history.  She herself thinks she had a treadmill test years ago but does not remember exactly why.  She denies any cardiovascular symptoms.  She goes to the gym.  She walks for exercise.  With this she denies any cardiovascular symptoms. The patient denies any new symptoms such as chest discomfort, neck or arm discomfort. There has been no new shortness of breath, PND or orthopnea. There have been no reported palpitations, presyncope or syncope.   Past Medical History:  Diagnosis Date   Allergy    Anxiety    GERD (gastroesophageal reflux disease)    Hyperlipidemia    Osteopenia 02/19/2005   Urinary incontinence     Past Surgical History:  Procedure Laterality Date   bladder tack     BREAST EXCISIONAL BIOPSY Left    CESAREAN SECTION     COLONOSCOPY  08/2006   REDUCTION MAMMAPLASTY Bilateral 05/13/2017   REPEAT CESAREAN SECTION       Current Outpatient Medications  Medication Sig Dispense Refill   ALPRAZolam (XANAX) 0.25 MG tablet TAKE 1 TABLET BY MOUTH AT BEDTIME AS NEEDED FOR ANXIETY. 30 tablet 2   aspirin EC 81 MG tablet Take 1 tablet (81 mg total) by mouth daily.     Biotin 1000 MCG tablet Take 1 tablet (1 mg total) by mouth daily. 90 tablet 3   Calcium Carbonate-Vitamin D 600-400 MG-UNIT tablet Take 1 tablet by mouth 2 (two) times daily. 180 tablet 3   Omega-3 Fatty Acids (FISH OIL) 1000 MG CAPS Take 2qd 180 capsule 3   pantoprazole (PROTONIX) 40 MG tablet Take 1 tablet (40 mg total) by mouth 2  (two) times daily before a meal. 180 tablet 1   predniSONE (DELTASONE) 20 MG tablet Take 2 pills for 3 days, 1 pill for 4 days 10 tablet 0   rosuvastatin (CRESTOR) 10 MG tablet Take 1 tablet (10 mg total) by mouth daily. 90 tablet 3   spironolactone (ALDACTONE) 100 MG tablet TAKE 1 TABLET BY MOUTH EVERY DAY 90 tablet 1   tretinoin (RETIN-A) 0.025 % cream Apply topically at bedtime. 45 g 11   No current facility-administered medications for this visit.    Allergies:   Patient has no known allergies.    Social History:  The patient  reports that she has never smoked. She has never used smokeless tobacco. She reports current alcohol use of about 10.0 standard drinks of alcohol per week. She reports that she does not use drugs.   Family History:  The patient's family history includes Alcohol abuse in her sister; Breast cancer in her maternal grandmother; Cancer in her brother and maternal grandmother; Depression in her brother and sister; Diabetes in her brother and paternal grandfather; Esophageal cancer in her brother; Heart attack (age of onset: 18) in her mother; Heart attack (age of onset: 59) in her father; Hyperlipidemia in her brother, brother, and mother; Hypertension in her brother and sister; Obesity  in her sister; Stroke in her father and sister.    ROS:  Please see the history of present illness.   Otherwise, review of systems are positive for none.   All other systems are reviewed and negative.    PHYSICAL EXAM: VS:  BP 116/76   Pulse 61   Ht 5' (1.524 m)   Wt 124 lb (56.2 kg)   SpO2 98%   BMI 24.22 kg/m  , BMI Body mass index is 24.22 kg/m. GENERAL:  Well appearing HEENT:  Pupils equal round and reactive, fundi not visualized, oral mucosa unremarkable NECK:  No jugular venous distention, waveform within normal limits, carotid upstroke brisk and symmetric, no bruits, no thyromegaly LYMPHATICS:  No cervical, inguinal adenopathy LUNGS:  Clear to auscultation  bilaterally BACK:  No CVA tenderness CHEST:  Unremarkable HEART:  PMI not displaced or sustained,S1 and S2 within normal limits, no S3, no S4, no clicks, no rubs, no murmurs ABD:  Flat, positive bowel sounds normal in frequency in pitch, no bruits, no rebound, no guarding, no midline pulsatile mass, no hepatomegaly, no splenomegaly EXT:  2 plus pulses throughout, no edema, no cyanosis no clubbing SKIN:  No rashes no nodules NEURO:  Cranial nerves II through XII grossly intact, motor grossly intact throughout PSYCH:  Cognitively intact, oriented to person place and time    EKG:  EKG is ordered today. The ekg ordered today demonstrates sinus rhythm, rate 61, axis within normal limits, intervals within normal limits, no acute ST-T wave changes.   Recent Labs: 04/25/2020: ALT 31; BUN 12; Creatinine, Ser 0.76; Potassium 3.8; Sodium 137    Lipid Panel    Component Value Date/Time   CHOL 223 (H) 04/25/2020 1023   TRIG 81.0 04/25/2020 1023   HDL 95.70 04/25/2020 1023   CHOLHDL 2 04/25/2020 1023   VLDL 16.2 04/25/2020 1023   LDLCALC 111 (H) 04/25/2020 1023   LDLDIRECT 134.6 08/24/2013 0953      Wt Readings from Last 3 Encounters:  01/18/21 124 lb (56.2 kg)  10/10/20 124 lb 12.8 oz (56.6 kg)  10/09/20 122 lb (55.3 kg)      Other studies Reviewed: Additional studies/ records that were reviewed today include: Labs. Review of the above records demonstrates:  Please see elsewhere in the note.     ASSESSMENT AND PLAN:  FAMILY HISTORY: Patient has a very significant family history of early onset coronary artery disease.  She has cardiovascular risk factors.  I am going to start with a coronary calcium score and have a low threshold for treadmill testing.  We talked as below about primary risk reduction.  For now I am going to have her continue a low-dose aspirin but we can decide on this based on the calcium score or other testing.  DYSLIPIDEMIA: Her LDL is mildly elevated at 111 with  an HDL of 95.  Goals of therapy will be based on the calcium score.  We talked at great length about exercise and diet suggesting a Mediterranean plant-based approach.   Current medicines are reviewed at length with the patient today.  The patient does not have concerns regarding medicines.  The following changes have been made:  no change  Labs/ tests ordered today include:   Orders Placed This Encounter  Procedures   CT CARDIAC SCORING (SELF PAY ONLY)   EKG 12-Lead      Disposition:   FU with me as needed.      Signed, Minus Breeding, MD  01/18/2021 12:38 PM  [facility]

## 2021-01-18 ENCOUNTER — Encounter: Payer: Self-pay | Admitting: Cardiology

## 2021-01-18 ENCOUNTER — Other Ambulatory Visit: Payer: Self-pay

## 2021-01-18 ENCOUNTER — Ambulatory Visit (INDEPENDENT_AMBULATORY_CARE_PROVIDER_SITE_OTHER): Payer: Medicare PPO | Admitting: Cardiology

## 2021-01-18 VITALS — BP 116/76 | HR 61 | Ht 60.0 in | Wt 124.0 lb

## 2021-01-18 DIAGNOSIS — Z136 Encounter for screening for cardiovascular disorders: Secondary | ICD-10-CM

## 2021-01-18 NOTE — Patient Instructions (Signed)
  Testing/Procedures:  CT CORONARY CALCIUM SCORING AT [facility]   Follow-Up: At [facility], you and your health needs are our priority.  As part of our continuing mission to provide you with exceptional heart care, we have created designated Provider Care Teams.  These Care Teams include your primary Cardiologist (physician) and Advanced Practice Providers (APPs -  Physician Assistants and Nurse Practitioners) who all work together to provide you with the care you need, when you need it.  We recommend signing up for the patient portal called "MyChart".  Sign up information is provided on this After Visit Summary.  MyChart is used to connect with patients for Virtual Visits (Telemedicine).  Patients are able to view lab/test results, encounter notes, upcoming appointments, etc.  Non-urgent messages can be sent to your provider as well.   To learn more about what you can do with MyChart, go to [email or web address].    Your next appointment:    AS NEEDED

## 2021-01-30 ENCOUNTER — Other Ambulatory Visit: Payer: Medicare PPO

## 2021-01-31 ENCOUNTER — Encounter: Payer: Self-pay | Admitting: Family Medicine

## 2021-02-08 ENCOUNTER — Encounter: Payer: Self-pay | Admitting: Family Medicine

## 2021-02-08 ENCOUNTER — Other Ambulatory Visit: Payer: Self-pay | Admitting: Family Medicine

## 2021-02-08 ENCOUNTER — Ambulatory Visit (INDEPENDENT_AMBULATORY_CARE_PROVIDER_SITE_OTHER)
Admission: RE | Admit: 2021-02-08 | Discharge: 2021-02-08 | Disposition: A | Discharge status: Home / Self Care | Payer: Self-pay | Source: Ambulatory Visit | Attending: Cardiology | Admitting: Cardiology

## 2021-02-08 ENCOUNTER — Other Ambulatory Visit: Payer: Self-pay

## 2021-02-08 DIAGNOSIS — K219 Gastro-esophageal reflux disease without esophagitis: Secondary | ICD-10-CM

## 2021-02-08 DIAGNOSIS — Z136 Encounter for screening for cardiovascular disorders: Secondary | ICD-10-CM

## 2021-02-15 ENCOUNTER — Encounter: Payer: Self-pay | Admitting: Family Medicine

## 2021-02-21 ENCOUNTER — Encounter: Payer: Self-pay | Admitting: Nurse Practitioner

## 2021-03-12 ENCOUNTER — Telehealth: Payer: Self-pay | Admitting: Family Medicine

## 2021-03-12 NOTE — Telephone Encounter (Signed)
Pt called and is requesting refill on ALPRAZolam (XANAX) 0.25 MG tablet, please advise

## 2021-03-13 ENCOUNTER — Other Ambulatory Visit: Payer: Self-pay | Admitting: Family Medicine

## 2021-03-13 ENCOUNTER — Other Ambulatory Visit: Payer: Self-pay | Admitting: Nurse Practitioner

## 2021-03-13 DIAGNOSIS — Z1231 Encounter for screening mammogram for malignant neoplasm of breast: Secondary | ICD-10-CM

## 2021-03-14 ENCOUNTER — Encounter: Payer: Self-pay | Admitting: Nurse Practitioner

## 2021-03-14 ENCOUNTER — Other Ambulatory Visit: Payer: Self-pay

## 2021-03-14 ENCOUNTER — Ambulatory Visit (INDEPENDENT_AMBULATORY_CARE_PROVIDER_SITE_OTHER): Payer: Medicare PPO | Admitting: Nurse Practitioner

## 2021-03-14 VITALS — BP 100/60 | HR 66 | Temp 98.1°F | Ht 60.0 in | Wt 122.2 lb

## 2021-03-14 DIAGNOSIS — Z8619 Personal history of other infectious and parasitic diseases: Secondary | ICD-10-CM | POA: Insufficient documentation

## 2021-03-14 DIAGNOSIS — E782 Mixed hyperlipidemia: Secondary | ICD-10-CM

## 2021-03-14 DIAGNOSIS — Z8249 Family history of ischemic heart disease and other diseases of the circulatory system: Secondary | ICD-10-CM | POA: Diagnosis not present

## 2021-03-14 DIAGNOSIS — Z0001 Encounter for general adult medical examination with abnormal findings: Secondary | ICD-10-CM | POA: Diagnosis not present

## 2021-03-14 MED ORDER — VALACYCLOVIR HCL 1 G PO TABS: 1000.0000 mg | ORAL_TABLET | Freq: Every day | ORAL | 0 refills | Status: DC | PRN

## 2021-03-14 NOTE — Patient Instructions (Signed)
Schedule lab appt for fasting blood draw  Preventive Care 65 Years and Older, Female Preventive care refers to lifestyle choices and visits with your health care provider that can promote health and wellness. This includes: A yearly physical exam. This is also called an annual wellness visit. Regular dental and eye exams. Immunizations. Screening for certain conditions. Healthy lifestyle choices, such as: Eating a healthy diet. Getting regular exercise. Not using drugs or products that contain nicotine and tobacco. Limiting alcohol use. What can I expect for my preventive care visit? Physical exam Your health care provider will check your: Height and weight. These may be used to calculate your BMI (body mass index). BMI is a measurement that tells if you are at a healthy weight. Heart rate and blood pressure. Body temperature. Skin for abnormal spots. Counseling Your health care provider may ask you questions about your: Past medical problems. Family's medical history. Alcohol, tobacco, and drug use. Emotional well-being. Home life and relationship well-being. Sexual activity. Diet, exercise, and sleep habits. History of falls. Memory and ability to understand (cognition). Work and work Statistician. Pregnancy and menstrual history. Access to firearms. What immunizations do I need?  Vaccines are usually given at various ages, according to a schedule. Your health care provider will recommend vaccines for you based on your age, medicalhistory, and lifestyle or other factors, such as travel or where you work. What tests do I need? Blood tests Lipid and cholesterol levels. These may be checked every 5 years, or more often depending on your overall health. Hepatitis C test. Hepatitis B test. Screening Lung cancer screening. You may have this screening every year starting at age 41 if you have a 30-pack-year history of smoking and currently smoke or have quit within the past 15  years. Colorectal cancer screening. All adults should have this screening starting at age 1 and continuing until age 46. Your health care provider may recommend screening at age 75 if you are at increased risk. You will have tests every 1-10 years, depending on your results and the type of screening test. Diabetes screening. This is done by checking your blood sugar (glucose) after you have not eaten for a while (fasting). You may have this done every 1-3 years. Mammogram. This may be done every 1-2 years. Talk with your health care provider about how often you should have regular mammograms. Abdominal aortic aneurysm (AAA) screening. You may need this if you are a current or former smoker. BRCA-related cancer screening. This may be done if you have a family history of breast, ovarian, tubal, or peritoneal cancers. Other tests STD (sexually transmitted disease) testing, if you are at risk. Bone density scan. This is done to screen for osteoporosis. You may have this done starting at age 17. Talk with your health care provider about your test results, treatment options,and if necessary, the need for more tests. Follow these instructions at home: Eating and drinking  Eat a diet that includes fresh fruits and vegetables, whole grains, lean protein, and low-fat dairy products. Limit your intake of foods with high amounts of sugar, saturated fats, and salt. Take vitamin and mineral supplements as recommended by your health care provider. Do not drink alcohol if your health care provider tells you not to drink. If you drink alcohol: Limit how much you have to 0-1 drink a day. Be aware of how much alcohol is in your drink. In the [place] one drink equals one 12 oz bottle of beer (355 mL), one 5 oz  glass of wine (148 mL), or one 1 oz glass of hard liquor (44 mL).  Lifestyle Take daily care of your teeth and gums. Brush your teeth every morning and night with fluoride toothpaste. Floss one time  each day. Stay active. Exercise for at least 30 minutes 5 or more days each week. Do not use any products that contain nicotine or tobacco, such as cigarettes, e-cigarettes, and chewing tobacco. If you need help quitting, ask your health care provider. Do not use drugs. If you are sexually active, practice safe sex. Use a condom or other form of protection in order to prevent STIs (sexually transmitted infections). Talk with your health care provider about taking a low-dose aspirin or statin. Find healthy ways to cope with stress, such as: Meditation, yoga, or listening to music. Journaling. Talking to a trusted person. Spending time with friends and family. Safety Always wear your seat belt while driving or riding in a vehicle. Do not drive: If you have been drinking alcohol. Do not ride with someone who has been drinking. When you are tired or distracted. While texting. Wear a helmet and other protective equipment during sports activities. If you have firearms in your house, make sure you follow all gun safety procedures. What's next? Visit your health care provider once a year for an annual wellness visit. Ask your health care provider how often you should have your eyes and teeth checked. Stay up to date on all vaccines. This information is not intended to replace advice given to you by your health care provider. Make sure you discuss any questions you have with your healthcare provider. Document Revised: 06/28/2020 Document Reviewed: 07/02/2018 Elsevier Patient Education  2022 [employer].

## 2021-03-14 NOTE — Assessment & Plan Note (Signed)
Has an outbreak 1-2x/year. Had recent outbreak last week Will like to have valtrex prescription for furture outbreaks rx sent

## 2021-03-14 NOTE — Assessment & Plan Note (Signed)
CT cardiac score: 0 Current use of crestor '10mg'$  BP at goal, BP Readings from Last 3 Encounters:  03/14/21 100/60  01/18/21 116/76  10/10/20 130/82  normal BMI Wt Readings from Last 3 Encounters:  03/14/21 122 lb 3.2 oz (55.4 kg)  01/18/21 124 lb (56.2 kg)  10/10/20 124 lb 12.8 oz (56.6 kg)  No DM or prediabetes No tobacco or ETOH consumption  Repeat lipid panel and HsCRP Maintain current crestor dose

## 2021-03-14 NOTE — Progress Notes (Signed)
Subjective:    Patient ID: Patricia Hampton, female    DOB: [birth year], 70 y.o.   MRN: [id number]  Patient presents today for CPE and eval of chronic conditions   HPI Family history of premature CAD CT cardiac score: 0 Current use of crestor '10mg'$  BP at goal, BP Readings from Last 3 Encounters:  03/14/21 100/60  01/18/21 116/76  10/10/20 130/82  normal BMI Wt Readings from Last 3 Encounters:  03/14/21 122 lb 3.2 oz (55.4 kg)  01/18/21 124 lb (56.2 kg)  10/10/20 124 lb 12.8 oz (56.6 kg)  No DM or prediabetes No tobacco or ETOH consumption  Repeat lipid panel and HsCRP Maintain current crestor dose  History of herpes labialis Has an outbreak 1-2x/year. Had recent outbreak last week Will like to have valtrex prescription for furture outbreaks rx sent  Vision:up to date Dental:up to date Diet:DASH diet Exercise:cardio and weight training daily Weight:  Wt Readings from Last 3 Encounters:  03/14/21 122 lb 3.2 oz (55.4 kg)  01/18/21 124 lb (56.2 kg)  10/10/20 124 lb 12.8 oz (56.6 kg)   Sexual History (orientation,birth control, marital status, STD):no need for STD screen, up to date with PAP and mammogram  Depression/Suicide: Depression screen [facility] 2/9 03/14/2021 08/29/2020 08/18/2019 08/17/2019 06/22/2018 05/06/2017 02/22/2015  Decreased Interest 0 0 0 0 0 0 0  Down, Depressed, Hopeless 0 0 0 0 0 0 0  PHQ - 2 Score 0 0 0 0 0 0 0   Immunizations: (TDAP, Hep C screen, Pneumovax, Influenza, zoster)  Health Maintenance  Topic Date Due   COVID-19 Vaccine (4 - Booster for Pfizer series) 09/16/2020   Flu Shot  02/19/2021   Mammogram  04/11/2021   Colon Cancer Screening  02/10/2024   Tetanus Vaccine  10/01/2026   DEXA scan (bone density measurement)  Completed   Hepatitis C Screening: USPSTF Recommendation to screen - Ages 45-79 yo.  Completed   Pneumonia vaccines  Completed   Zoster (Shingles) Vaccine  Completed   HPV Vaccine  Aged Out   Fall Risk: Fall Risk  08/29/2020  08/18/2019 08/17/2019 06/22/2018 05/06/2017  Falls in the past year? 0 0 0 0 No  Number falls in past yr: 0 0 - - -  Injury with Fall? 0 0 - - -  Follow up Falls prevention discussed Education provided;Falls prevention discussed Falls evaluation completed Falls evaluation completed -   Advanced Directive: Advanced Directives 03/14/2021  Does Patient Have a Medical Advance Directive? Yes  Type of Advance Directive Living will;Healthcare Power of Attorney  Does patient want to make changes to medical advance directive? -  Copy of [place] in Chart? No - copy requested    Medications and allergies reviewed with patient and updated if appropriate.  Patient Active Problem List   Diagnosis Date Noted   History of herpes labialis 03/14/2021   Hot flashes due to menopause 08/17/2019   GERD (gastroesophageal reflux disease) 08/16/2019   Hair loss 11/19/2018   Back problem 02/01/2016   Osteopenia 04/25/2015   History of postmenopausal HRT 04/25/2015   FH: stroke 02/03/2014   Family history of premature CAD 02/03/2014   Allergic rhinitis 04/21/2008   HLD (hyperlipidemia) 04/09/2007   Diverticulosis of large intestine 08/22/2006   Osteopenia after menopause 02/19/2005    Current Outpatient Medications on File Prior to Visit  Medication Sig Dispense Refill   ALPRAZolam (XANAX) 0.25 MG tablet TAKE 1 TABLET BY MOUTH AT BEDTIME AS NEEDED FOR ANXIETY. 30 tablet  2   aspirin EC 81 MG tablet Take 1 tablet (81 mg total) by mouth daily.     Biotin 1000 MCG tablet Take 1 tablet (1 mg total) by mouth daily. 90 tablet 3   Calcium Carbonate-Vitamin D 600-400 MG-UNIT tablet Take 1 tablet by mouth 2 (two) times daily. 180 tablet 3   Omega-3 Fatty Acids (FISH OIL) 1000 MG CAPS Take 2qd 180 capsule 3   pantoprazole (PROTONIX) 40 MG tablet TAKE 1 TABLET (40 MG TOTAL) BY MOUTH 2 (TWO) TIMES DAILY BEFORE A MEAL. 180 tablet 1   rosuvastatin (CRESTOR) 10 MG tablet Take 1 tablet (10 mg total) by  mouth daily. 90 tablet 3   spironolactone (ALDACTONE) 100 MG tablet TAKE 1 TABLET BY MOUTH EVERY DAY 90 tablet 1   tretinoin (RETIN-A) 0.025 % cream Apply topically at bedtime. 45 g 11   No current facility-administered medications on file prior to visit.    Past Medical History:  Diagnosis Date   Allergy    Anxiety    GERD (gastroesophageal reflux disease)    Hyperlipidemia    Osteopenia 02/19/2005   Urinary incontinence     Past Surgical History:  Procedure Laterality Date   bladder tack     BREAST EXCISIONAL BIOPSY Left    CESAREAN SECTION     COLONOSCOPY  08/2006   REDUCTION MAMMAPLASTY Bilateral 05/13/2017   REPEAT CESAREAN SECTION      Social History   Socioeconomic History   Marital status: Married    Spouse name: Not on file   Number of children: 2   Years of education: Not on file   Highest education level: Not on file  Occupational History    Employer: [employer]  Tobacco Use   Smoking status: Never   Smokeless tobacco: Never  Vaping Use   Vaping Use: Never used  Substance and Sexual Activity   Alcohol use: Yes    Alcohol/week: 10.0 standard drinks    Types: 10 Glasses of wine per week    Comment: 8-10 glasses wine per week per pt   Drug use: No   Sexual activity: Yes  Other Topics Concern   Not on file  Social History Narrative   Not on file   Social Determinants of Health   Financial Resource Strain: Low Risk    Difficulty of Paying Living Expenses: Not hard at all  Food Insecurity: No Food Insecurity   Worried About Charity fundraiser in the Last Year: Never true   [place] in the Last Year: Never true  Transportation Needs: No Transportation Needs   Lack of Transportation (Medical): No   Lack of Transportation (Non-Medical): No  Physical Activity: Sufficiently Active   Days of Exercise per Week: 7 days   Minutes of Exercise per Session: 60 min  Stress: No Stress Concern Present   Feeling of Stress : Not at all   Social Connections: Socially Integrated   Frequency of Communication with Friends and Family: More than three times a week   Frequency of Social Gatherings with Friends and Family: More than three times a week   Attends Religious Services: More than 4 times per year   Active Member of [employer] or Organizations: Yes   Attends Archivist Meetings: Never   Marital Status: Married    Family History  Problem Relation Age of Onset   Hyperlipidemia Mother    Heart attack Mother 48       Died of  MI   Stroke Father    Heart attack Father 8   Hypertension Sister    Depression Sister    Alcohol abuse Sister    Stroke Sister    Obesity Sister    Hyperlipidemia Brother    Cancer Brother        ESOPHAGEAL   Esophageal cancer Brother    Diabetes Brother    Hypertension Brother    Depression Brother    Hyperlipidemia Brother    Cancer Maternal Grandmother        breast   Breast cancer Maternal Grandmother    Diabetes Paternal Grandfather    Colon cancer Neg Hx    Colon polyps Neg Hx    Rectal cancer Neg Hx    Stomach cancer Neg Hx       Review of Systems  Constitutional:  Negative for fever, malaise/fatigue and weight loss.  HENT:  Negative for congestion and sore throat.   Eyes:        Negative for visual changes  Respiratory:  Negative for cough and shortness of breath.   Cardiovascular:  Negative for chest pain, palpitations and leg swelling.  Gastrointestinal:  Negative for blood in stool, constipation, diarrhea and heartburn.  Genitourinary:  Negative for dysuria, frequency and urgency.  Musculoskeletal:  Negative for falls, joint pain and myalgias.  Skin:  Negative for rash.  Neurological:  Negative for dizziness, sensory change and headaches.  Endo/Heme/Allergies:  Does not bruise/bleed easily.  Psychiatric/Behavioral:  Negative for depression, substance abuse and suicidal ideas. The patient is not nervous/anxious.    Objective:   Vitals:   03/14/21 0949   BP: 100/60  Pulse: 66  Temp: 98.1 F (36.7 C)  SpO2: 98%   Body mass index is 23.87 kg/m.  Physical Examination:  Physical Exam Vitals reviewed.  Constitutional:      General: She is not in acute distress. HENT:     Right Ear: Tympanic membrane, ear canal and external ear normal.     Left Ear: Tympanic membrane, ear canal and external ear normal.     Nose: Nose normal.     Mouth/Throat:     Pharynx: No oropharyngeal exudate.  Eyes:     General: No scleral icterus.    Conjunctiva/sclera: Conjunctivae normal.     Pupils: Pupils are equal, round, and reactive to light.  Neck:     Thyroid: No thyromegaly.  Cardiovascular:     Rate and Rhythm: Normal rate and regular rhythm.     Pulses: Normal pulses.     Heart sounds: Normal heart sounds.  Pulmonary:     Effort: Pulmonary effort is normal.     Breath sounds: Normal breath sounds.  Chest:     Chest wall: No tenderness.  Abdominal:     General: Bowel sounds are normal. There is no distension.     Palpations: Abdomen is soft.     Tenderness: There is no abdominal tenderness.  Musculoskeletal:        General: No tenderness. Normal range of motion.     Cervical back: Normal range of motion and neck supple.  Lymphadenopathy:     Cervical: No cervical adenopathy.  Skin:    General: Skin is warm and dry.  Neurological:     Mental Status: She is alert and oriented to person, place, and time.  Psychiatric:        Mood and Affect: Mood normal.        Behavior: Behavior normal.  Thought Content: Thought content normal.        Judgment: Judgment normal.    ASSESSMENT and PLAN: This visit occurred during the SARS-CoV-2 public health emergency.  Safety protocols were in place, including screening questions prior to the visit, additional usage of staff PPE, and extensive cleaning of exam room while observing appropriate contact time as indicated for disinfecting solutions.   Rudean was seen today for establish  care.  Diagnoses and all orders for this visit:  Encounter for preventative adult health care exam with abnormal findings -     CBC; Future -     Comprehensive metabolic panel; Future -     TSH; Future  History of herpes labialis -     valACYclovir (VALTREX) 1000 MG tablet; Take 1 tablet (1,000 mg total) by mouth daily as needed for up to 10 days.  Mixed hyperlipidemia -     CRP High sensitivity; Future -     Lipid panel; Future  Family history of premature CAD -     CRP High sensitivity; Future -     Lipid panel; Future     Problem List Items Addressed This Visit       Other   Family history of premature CAD    CT cardiac score: 0 Current use of crestor '10mg'$  BP at goal, BP Readings from Last 3 Encounters:  03/14/21 100/60  01/18/21 116/76  10/10/20 130/82  normal BMI Wt Readings from Last 3 Encounters:  03/14/21 122 lb 3.2 oz (55.4 kg)  01/18/21 124 lb (56.2 kg)  10/10/20 124 lb 12.8 oz (56.6 kg)  No DM or prediabetes No tobacco or ETOH consumption  Repeat lipid panel and HsCRP Maintain current crestor dose      Relevant Orders   CRP High sensitivity   Lipid panel   History of herpes labialis    Has an outbreak 1-2x/year. Had recent outbreak last week Will like to have valtrex prescription for furture outbreaks rx sent      Relevant Medications   valACYclovir (VALTREX) 1000 MG tablet   HLD (hyperlipidemia)   Relevant Orders   CRP High sensitivity   Lipid panel   Other Visit Diagnoses     Encounter for preventative adult health care exam with abnormal findings    -  Primary   Relevant Orders   CBC   Comprehensive metabolic panel   TSH       Follow up: Return in about 6 months (around 09/14/2021) for hyperlipidemia (fasting).  Wilfred Lacy, NP

## 2021-03-16 NOTE — Telephone Encounter (Signed)
Pt seen on 03/14/21

## 2021-03-19 ENCOUNTER — Other Ambulatory Visit: Payer: Medicare PPO

## 2021-03-21 ENCOUNTER — Encounter: Payer: Self-pay | Admitting: Nurse Practitioner

## 2021-03-22 ENCOUNTER — Encounter: Payer: Self-pay | Admitting: Nurse Practitioner

## 2021-03-28 ENCOUNTER — Other Ambulatory Visit: Payer: Self-pay

## 2021-03-28 ENCOUNTER — Other Ambulatory Visit (INDEPENDENT_AMBULATORY_CARE_PROVIDER_SITE_OTHER): Payer: Medicare PPO

## 2021-03-28 DIAGNOSIS — Z0001 Encounter for general adult medical examination with abnormal findings: Secondary | ICD-10-CM

## 2021-03-28 DIAGNOSIS — Z8249 Family history of ischemic heart disease and other diseases of the circulatory system: Secondary | ICD-10-CM | POA: Diagnosis not present

## 2021-03-28 DIAGNOSIS — E782 Mixed hyperlipidemia: Secondary | ICD-10-CM

## 2021-03-28 LAB — CBC
HCT: 39.6 % (ref 36.0–46.0)
Hemoglobin: 13.3 g/dL (ref 12.0–15.0)
MCHC: 33.5 g/dL (ref 30.0–36.0)
MCV: 93.9 fl (ref 78.0–100.0)
Platelets: 288 10*3/uL (ref 150.0–400.0)
RBC: 4.22 Mil/uL (ref 3.87–5.11)
RDW: 14.4 % (ref 11.5–15.5)
WBC: 4.9 10*3/uL (ref 4.0–10.5)

## 2021-03-28 LAB — COMPREHENSIVE METABOLIC PANEL
ALT: 22 U/L (ref 0–35)
AST: 18 U/L (ref 0–37)
Albumin: 4.6 g/dL (ref 3.5–5.2)
Alkaline Phosphatase: 48 U/L (ref 39–117)
BUN: 10 mg/dL (ref 6–23)
CO2: 29 mEq/L (ref 19–32)
Calcium: 9.6 mg/dL (ref 8.4–10.5)
Chloride: 100 mEq/L (ref 96–112)
Creatinine, Ser: 0.69 mg/dL (ref 0.40–1.20)
GFR: 89.51 mL/min (ref >60.00)
Glucose, Bld: 98 mg/dL (ref 70–99)
Potassium: 4.9 mEq/L (ref 3.5–5.1)
Sodium: 137 mEq/L (ref 135–145)
Total Bilirubin: 0.6 mg/dL (ref 0.2–1.2)
Total Protein: 6.8 g/dL (ref 6.0–8.3)

## 2021-03-28 LAB — LIPID PANEL
Cholesterol: 229 mg/dL — ABNORMAL HIGH (ref 0–200)
HDL: 107 mg/dL (ref >39.00)
LDL Cholesterol: 107 mg/dL — ABNORMAL HIGH (ref 0–99)
NonHDL: 122.35
Total CHOL/HDL Ratio: 2
Triglycerides: 75 mg/dL (ref 0.0–149.0)
VLDL: 15 mg/dL (ref 0.0–40.0)

## 2021-03-28 LAB — HIGH SENSITIVITY CRP: CRP, High Sensitivity: 0.83 mg/L (ref 0.000–5.000)

## 2021-03-28 LAB — TSH: TSH: 1.32 u[IU]/mL (ref 0.35–5.50)

## 2021-03-29 ENCOUNTER — Encounter: Payer: Self-pay | Admitting: Nurse Practitioner

## 2021-03-30 MED ORDER — ROSUVASTATIN CALCIUM 20 MG PO TABS: 20.0000 mg | ORAL_TABLET | Freq: Every day | ORAL | 3 refills | Status: DC

## 2021-03-30 NOTE — Addendum Note (Signed)
Addended by: Wilfred Lacy L on: 03/30/2021 01:01 PM   Modules accepted: Orders

## 2021-03-30 NOTE — Assessment & Plan Note (Signed)
Normal HsCRP Lipid Panel     Component Value Date/Time   CHOL 229 (H) 03/28/2021 0853   TRIG 75.0 03/28/2021 0853   HDL 107.00 03/28/2021 0853   CHOLHDL 2 03/28/2021 0853   VLDL 15.0 03/28/2021 0853   LDLCALC 107 (H) 03/28/2021 0853   LDLDIRECT 134.6 08/24/2013 0953   increase crestor to '20mg'$  Repeat lipid panel in 34month

## 2021-04-02 ENCOUNTER — Telehealth: Payer: Self-pay | Admitting: Nurse Practitioner

## 2021-04-02 NOTE — Telephone Encounter (Signed)
Pt called again for lab results Ph [phone]

## 2021-04-02 NOTE — Telephone Encounter (Signed)
Pt called to speak with Patricia Hampton, returning call.

## 2021-04-02 NOTE — Telephone Encounter (Signed)
Documented in lab results.

## 2021-04-17 ENCOUNTER — Encounter: Payer: Medicare PPO | Admitting: Nurse Practitioner

## 2021-04-19 ENCOUNTER — Encounter: Payer: Self-pay | Admitting: Nurse Practitioner

## 2021-04-19 DIAGNOSIS — Z8249 Family history of ischemic heart disease and other diseases of the circulatory system: Secondary | ICD-10-CM

## 2021-04-19 DIAGNOSIS — T466X5A Adverse effect of antihyperlipidemic and antiarteriosclerotic drugs, initial encounter: Secondary | ICD-10-CM

## 2021-04-19 DIAGNOSIS — E782 Mixed hyperlipidemia: Secondary | ICD-10-CM

## 2021-04-24 ENCOUNTER — Ambulatory Visit: Payer: Medicare PPO

## 2021-04-24 ENCOUNTER — Other Ambulatory Visit: Payer: Self-pay

## 2021-04-24 DIAGNOSIS — F419 Anxiety disorder, unspecified: Secondary | ICD-10-CM

## 2021-04-24 MED ORDER — ALPRAZOLAM 0.25 MG PO TABS: ORAL_TABLET | ORAL | 1 refills | Status: DC

## 2021-04-24 NOTE — Telephone Encounter (Signed)
Refill request for:  Alprazolam 0.25 mg   LR 12/15/20, #30, 2 rf LOV 03/14/21 FOV  none scheduled.  Please review and advise.  Thanks.  Dm/cma

## 2021-05-16 ENCOUNTER — Ambulatory Visit
Admission: RE | Admit: 2021-05-16 | Discharge: 2021-05-16 | Disposition: A | Discharge status: Home / Self Care | Payer: Medicare PPO | Source: Ambulatory Visit | Attending: Nurse Practitioner | Admitting: Nurse Practitioner

## 2021-05-16 ENCOUNTER — Other Ambulatory Visit: Payer: Self-pay

## 2021-05-16 DIAGNOSIS — Z1231 Encounter for screening mammogram for malignant neoplasm of breast: Secondary | ICD-10-CM

## 2021-05-25 DIAGNOSIS — E785 Hyperlipidemia, unspecified: Secondary | ICD-10-CM

## 2021-05-28 ENCOUNTER — Other Ambulatory Visit: Payer: Self-pay | Admitting: *Deleted

## 2021-05-28 DIAGNOSIS — E785 Hyperlipidemia, unspecified: Secondary | ICD-10-CM

## 2021-05-28 MED ORDER — PRAVASTATIN SODIUM 40 MG PO TABS: 40.0000 mg | ORAL_TABLET | Freq: Every evening | ORAL | 3 refills | Status: DC

## 2021-06-04 ENCOUNTER — Encounter: Payer: Self-pay | Admitting: Nurse Practitioner

## 2021-07-03 ENCOUNTER — Other Ambulatory Visit: Payer: Medicare PPO

## 2021-07-05 ENCOUNTER — Other Ambulatory Visit: Payer: Medicare PPO

## 2021-07-26 ENCOUNTER — Other Ambulatory Visit: Payer: Self-pay | Admitting: Nurse Practitioner

## 2021-07-26 DIAGNOSIS — Z8619 Personal history of other infectious and parasitic diseases: Secondary | ICD-10-CM

## 2021-08-10 DIAGNOSIS — D225 Melanocytic nevi of trunk: Secondary | ICD-10-CM | POA: Diagnosis not present

## 2021-08-10 DIAGNOSIS — Z23 Encounter for immunization: Secondary | ICD-10-CM | POA: Diagnosis not present

## 2021-08-10 DIAGNOSIS — E785 Hyperlipidemia, unspecified: Secondary | ICD-10-CM | POA: Diagnosis not present

## 2021-08-10 DIAGNOSIS — L821 Other seborrheic keratosis: Secondary | ICD-10-CM | POA: Diagnosis not present

## 2021-08-10 DIAGNOSIS — L658 Other specified nonscarring hair loss: Secondary | ICD-10-CM | POA: Diagnosis not present

## 2021-08-10 DIAGNOSIS — L719 Rosacea, unspecified: Secondary | ICD-10-CM | POA: Diagnosis not present

## 2021-08-10 DIAGNOSIS — L814 Other melanin hyperpigmentation: Secondary | ICD-10-CM | POA: Diagnosis not present

## 2021-08-10 DIAGNOSIS — L578 Other skin changes due to chronic exposure to nonionizing radiation: Secondary | ICD-10-CM | POA: Diagnosis not present

## 2021-08-10 DIAGNOSIS — Z79899 Other long term (current) drug therapy: Secondary | ICD-10-CM | POA: Diagnosis not present

## 2021-08-11 LAB — LIPID PANEL
Chol/HDL Ratio: 2.7 ratio (ref 0.0–4.4)
Cholesterol, Total: 283 mg/dL — ABNORMAL HIGH (ref 100–199)
HDL: 103 mg/dL (ref >39)
LDL Chol Calc (NIH): 159 mg/dL — ABNORMAL HIGH (ref 0–99)
Triglycerides: 124 mg/dL (ref 0–149)
VLDL Cholesterol Cal: 21 mg/dL (ref 5–40)

## 2021-08-13 ENCOUNTER — Other Ambulatory Visit: Payer: Self-pay | Admitting: *Deleted

## 2021-08-13 MED ORDER — PRAVASTATIN SODIUM 80 MG PO TABS: 80.0000 mg | ORAL_TABLET | Freq: Every evening | ORAL | 3 refills | Status: DC

## 2021-08-15 ENCOUNTER — Encounter: Payer: Self-pay | Admitting: Cardiology

## 2021-08-15 ENCOUNTER — Encounter: Payer: Self-pay | Admitting: Nurse Practitioner

## 2021-08-15 DIAGNOSIS — E785 Hyperlipidemia, unspecified: Secondary | ICD-10-CM

## 2021-09-11 ENCOUNTER — Other Ambulatory Visit: Payer: Self-pay | Admitting: Nurse Practitioner

## 2021-09-11 DIAGNOSIS — F419 Anxiety disorder, unspecified: Secondary | ICD-10-CM

## 2021-09-12 ENCOUNTER — Encounter: Payer: Self-pay | Admitting: Nurse Practitioner

## 2021-09-12 ENCOUNTER — Ambulatory Visit (INDEPENDENT_AMBULATORY_CARE_PROVIDER_SITE_OTHER): Payer: Medicare PPO

## 2021-09-12 DIAGNOSIS — Z0000 Encounter for general adult medical examination without abnormal findings: Secondary | ICD-10-CM | POA: Diagnosis not present

## 2021-09-12 NOTE — Patient Instructions (Signed)
Patricia Hampton , Thank you for taking time to come for your Medicare Wellness Visit. I appreciate your ongoing commitment to your health goals. Please review the following plan we discussed and let me know if I can assist you in the future.   Screening recommendations/referrals: Colonoscopy: 02/10/2019 Mammogram: 05/16/2021 Bone Density: 12/27/2020 Recommended yearly ophthalmology/optometry visit for glaucoma screening and checkup Recommended yearly dental visit for hygiene and checkup  Vaccinations: Influenza vaccine: completed  Pneumococcal vaccine: completed  Tdap vaccine: 09/30/2016 Shingles vaccine: completed    Advanced directives: yes  Conditions/risks identified: none   Next appointment: none    Preventive Care 69 Years and Older, Female Preventive care refers to lifestyle choices and visits with your health care provider that can promote health and wellness. What does preventive care include? A yearly physical exam. This is also called an annual well check. Dental exams once or twice a year. Routine eye exams. Ask your health care provider how often you should have your eyes checked. Personal lifestyle choices, including: Daily care of your teeth and gums. Regular physical activity. Eating a healthy diet. Avoiding tobacco and drug use. Limiting alcohol use. Practicing safe sex. Taking low-dose aspirin every day. Taking vitamin and mineral supplements as recommended by your health care provider. What happens during an annual well check? The services and screenings done by your health care provider during your annual well check will depend on your age, overall health, lifestyle risk factors, and family history of disease. Counseling  Your health care provider may ask you questions about your: Alcohol use. Tobacco use. Drug use. Emotional well-being. Home and relationship well-being. Sexual activity. Eating habits. History of falls. Memory and ability to  understand (cognition). Work and work Statistician. Reproductive health. Screening  You may have the following tests or measurements: Height, weight, and BMI. Blood pressure. Lipid and cholesterol levels. These may be checked every 5 years, or more frequently if you are over 69 years old. Skin check. Lung cancer screening. You may have this screening every year starting at age 69 if you have a 30-pack-year history of smoking and currently smoke or have quit within the past 15 years. Fecal occult blood test (FOBT) of the stool. You may have this test every year starting at age 69. Flexible sigmoidoscopy or colonoscopy. You may have a sigmoidoscopy every 5 years or a colonoscopy every 10 years starting at age 69. Hepatitis C blood test. Hepatitis B blood test. Sexually transmitted disease (STD) testing. Diabetes screening. This is done by checking your blood sugar (glucose) after you have not eaten for a while (fasting). You may have this done every 1-3 years. Bone density scan. This is done to screen for osteoporosis. You may have this done starting at age 69. Mammogram. This may be done every 1-2 years. Talk to your health care provider about how often you should have regular mammograms. Talk with your health care provider about your test results, treatment options, and if necessary, the need for more tests. Vaccines  Your health care provider may recommend certain vaccines, such as: Influenza vaccine. This is recommended every year. Tetanus, diphtheria, and acellular pertussis (Tdap, Td) vaccine. You may need a Td booster every 10 years. Zoster vaccine. You may need this after age 69. Pneumococcal 13-valent conjugate (PCV13) vaccine. One dose is recommended after age 69. Pneumococcal polysaccharide (PPSV23) vaccine. One dose is recommended after age 69. Talk to your health care provider about which screenings and vaccines you need and how often you need them.  This information is not  intended to replace advice given to you by your health care provider. Make sure you discuss any questions you have with your health care provider. Document Released: 08/04/2015 Document Revised: 03/27/2016 Document Reviewed: 05/09/2015 Elsevier Interactive Patient Education  2017 [employer] Prevention in the Home Falls can cause injuries. They can happen to people of all ages. There are many things you can do to make your home safe and to help prevent falls. What can I do on the outside of my home? Regularly fix the edges of walkways and driveways and fix any cracks. Remove anything that might make you trip as you walk through a door, such as a raised step or threshold. Trim any bushes or trees on the path to your home. Use bright outdoor lighting. Clear any walking paths of anything that might make someone trip, such as rocks or tools. Regularly check to see if handrails are loose or broken. Make sure that both sides of any steps have handrails. Any raised decks and porches should have guardrails on the edges. Have any leaves, snow, or ice cleared regularly. Use sand or salt on walking paths during winter. Clean up any spills in your garage right away. This includes oil or grease spills. What can I do in the bathroom? Use night lights. Install grab bars by the toilet and in the tub and shower. Do not use towel bars as grab bars. Use non-skid mats or decals in the tub or shower. If you need to sit down in the shower, use a plastic, non-slip stool. Keep the floor dry. Clean up any water that spills on the floor as soon as it happens. Remove soap buildup in the tub or shower regularly. Attach bath mats securely with double-sided non-slip rug tape. Do not have throw rugs and other things on the floor that can make you trip. What can I do in the bedroom? Use night lights. Make sure that you have a light by your bed that is easy to reach. Do not use any sheets or blankets that are  too big for your bed. They should not hang down onto the floor. Have a firm chair that has side arms. You can use this for support while you get dressed. Do not have throw rugs and other things on the floor that can make you trip. What can I do in the kitchen? Clean up any spills right away. Avoid walking on wet floors. Keep items that you use a lot in easy-to-reach places. If you need to reach something above you, use a strong step stool that has a grab bar. Keep electrical cords out of the way. Do not use floor polish or wax that makes floors slippery. If you must use wax, use non-skid floor wax. Do not have throw rugs and other things on the floor that can make you trip. What can I do with my stairs? Do not leave any items on the stairs. Make sure that there are handrails on both sides of the stairs and use them. Fix handrails that are broken or loose. Make sure that handrails are as long as the stairways. Check any carpeting to make sure that it is firmly attached to the stairs. Fix any carpet that is loose or worn. Avoid having throw rugs at the top or bottom of the stairs. If you do have throw rugs, attach them to the floor with carpet tape. Make sure that you have a light switch at the  top of the stairs and the bottom of the stairs. If you do not have them, ask someone to add them for you. What else can I do to help prevent falls? Wear shoes that: Do not have high heels. Have rubber bottoms. Are comfortable and fit you well. Are closed at the toe. Do not wear sandals. If you use a stepladder: Make sure that it is fully opened. Do not climb a closed stepladder. Make sure that both sides of the stepladder are locked into place. Ask someone to hold it for you, if possible. Clearly mark and make sure that you can see: Any grab bars or handrails. First and last steps. Where the edge of each step is. Use tools that help you move around (mobility aids) if they are needed. These  include: Canes. Walkers. Scooters. Crutches. Turn on the lights when you go into a dark area. Replace any light bulbs as soon as they burn out. Set up your furniture so you have a clear path. Avoid moving your furniture around. If any of your floors are uneven, fix them. If there are any pets around you, be aware of where they are. Review your medicines with your doctor. Some medicines can make you feel dizzy. This can increase your chance of falling. Ask your doctor what other things that you can do to help prevent falls. This information is not intended to replace advice given to you by your health care provider. Make sure you discuss any questions you have with your health care provider. Document Released: 05/04/2009 Document Revised: 12/14/2015 Document Reviewed: 08/12/2014 Elsevier Interactive Patient Education  2017 [employer].

## 2021-09-12 NOTE — Progress Notes (Signed)
Subjective:   Patricia Hampton is a 71 y.o. female who presents for Medicare Annual (Subsequent) preventive examination.   I connected withBe=th Danowski today by telephone and verified that I am speaking with the correct person using two identifiers. Location patient: home Location provider: work Persons participating in the virtual visit: patient, provider.   I discussed the limitations, risks, security and privacy concerns of performing an evaluation and management service by telephone and the availability of in person appointments. I also discussed with the patient that there may be a patient responsible charge related to this service. The patient expressed understanding and verbally consented to this telephonic visit.    Interactive audio and video telecommunications were attempted between this provider and patient, however failed, due to patient having technical difficulties OR patient did not have access to video capability.  We continued and completed visit with audio only.    Review of Systems     Cardiac Risk Factors include: advanced age (>38men, >33 women)     Objective:    Today's Vitals   There is no height or weight on file to calculate BMI.  Advanced Directives 09/12/2021 03/14/2021 10/16/2020 08/29/2020 08/18/2019  Does Patient Have a Medical Advance Directive? Yes Yes Yes Yes Yes  Type of Paramedic of [place];Living will Living will;Healthcare Power of Attorney Living will;Healthcare Power of [place];Living will [place];Living will  Does patient want to make changes to medical advance directive? - - - - No - Patient declined  Copy of [place] in Chart? No - copy requested No - copy requested - No - copy requested No - copy requested    Current Medications (verified) Outpatient Encounter Medications as of 09/12/2021  Medication Sig   ALPRAZolam (XANAX) 0.25 MG tablet  TAKE 1 TABLET BY MOUTH AT BEDTIME AS NEEDED FOR ANXIETY.   aspirin EC 81 MG tablet Take 1 tablet (81 mg total) by mouth daily.   Biotin 1000 MCG tablet Take 1 tablet (1 mg total) by mouth daily.   Calcium Carbonate-Vitamin D 600-400 MG-UNIT tablet Take 1 tablet by mouth 2 (two) times daily.   Omega-3 Fatty Acids (FISH OIL) 1000 MG CAPS Take 2qd   pantoprazole (PROTONIX) 40 MG tablet TAKE 1 TABLET (40 MG TOTAL) BY MOUTH 2 (TWO) TIMES DAILY BEFORE A MEAL.   pravastatin (PRAVACHOL) 80 MG tablet Take 1 tablet (80 mg total) by mouth every evening.   spironolactone (ALDACTONE) 100 MG tablet TAKE 1 TABLET BY MOUTH EVERY DAY   tretinoin (RETIN-A) 0.025 % cream Apply topically at bedtime.   No facility-administered encounter medications on file as of 09/12/2021.    Allergies (verified) Patient has no known allergies.   History: Past Medical History:  Diagnosis Date   Allergy    Anxiety    GERD (gastroesophageal reflux disease)    Hyperlipidemia    Osteopenia 02/19/2005   Urinary incontinence    Past Surgical History:  Procedure Laterality Date   bladder tack     BREAST EXCISIONAL BIOPSY Left    CESAREAN SECTION     COLONOSCOPY  08/2006   REDUCTION MAMMAPLASTY Bilateral 05/13/2017   REPEAT CESAREAN SECTION     Family History  Problem Relation Age of Onset   Hyperlipidemia Mother    Heart attack Mother 4       Died of MI   Stroke Father    Heart attack Father 96   Hypertension Sister  Depression Sister    Alcohol abuse Sister    Stroke Sister    Obesity Sister    Hyperlipidemia Brother    Cancer Brother        ESOPHAGEAL   Esophageal cancer Brother    Diabetes Brother    Hypertension Brother    Depression Brother    Hyperlipidemia Brother    Cancer Maternal Grandmother        breast   Breast cancer Maternal Grandmother    Diabetes Paternal Grandfather    Colon cancer Neg Hx    Colon polyps Neg Hx    Rectal cancer Neg Hx    Stomach cancer Neg Hx    Social History    Socioeconomic History   Marital status: Married    Spouse name: Not on file   Number of children: 2   Years of education: Not on file   Highest education level: Not on file  Occupational History    Employer: [employer]  Tobacco Use   Smoking status: Never   Smokeless tobacco: Never  Vaping Use   Vaping Use: Never used  Substance and Sexual Activity   Alcohol use: Yes    Alcohol/week: 10.0 standard drinks    Types: 10 Glasses of wine per week    Comment: 8-10 glasses wine per week per pt   Drug use: No   Sexual activity: Yes  Other Topics Concern   Not on file  Social History Narrative   Not on file   Social Determinants of Health   Financial Resource Strain: Low Risk    Difficulty of Paying Living Expenses: Not hard at all  Food Insecurity: No Food Insecurity   Worried About Charity fundraiser in the Last Year: Never true   [place] in the Last Year: Never true  Transportation Needs: No Transportation Needs   Lack of Transportation (Medical): No   Lack of Transportation (Non-Medical): No  Physical Activity: Sufficiently Active   Days of Exercise per Week: 5 days   Minutes of Exercise per Session: 60 min  Stress: No Stress Concern Present   Feeling of Stress : Not at all  Social Connections: Socially Integrated   Frequency of Communication with Friends and Family: Three times a week   Frequency of Social Gatherings with Friends and Family: Three times a week   Attends Religious Services: More than 4 times per year   Active Member of Clubs or Organizations: Yes   Attends Music therapist: More than 4 times per year   Marital Status: Married    Tobacco Counseling Counseling given: Not Answered   Clinical Intake:  Pre-visit preparation completed: Yes  Pain : No/denies pain     Nutritional Risks: None Diabetes: No  How often do you need to have someone help you when you read instructions, pamphlets, or other written  materials from your doctor or pharmacy?: 1 - Never What is the last grade level you completed in school?: college  Diabetic?no   Interpreter Needed?: No  Information entered by :: [place] of Daily Living In your present state of health, do you have any difficulty performing the following activities: 09/12/2021  Hearing? N  Vision? N  Difficulty concentrating or making decisions? N  Walking or climbing stairs? N  Dressing or bathing? N  Doing errands, shopping? N  Preparing Food and eating ? N  Using the Toilet? N  In the past six months, have you accidently  leaked urine? N  Do you have problems with loss of bowel control? N  Managing your Medications? N  Managing your Finances? N  Housekeeping or managing your Housekeeping? N  Some recent data might be hidden    Patient Care Team: Nche, Charlene Brooke, NP as PCP - General (Internal Medicine)  Indicate any recent Medical Services you may have received from other than [facility] providers in the past year (date may be approximate).     Assessment:   This is a routine wellness examination for [place].  Hearing/Vision screen No results found.  Dietary issues and exercise activities discussed: Current Exercise Habits: Home exercise routine, Type of exercise: walking, Time (Minutes): 60, Frequency (Times/Week): 5, Weekly Exercise (Minutes/Week): 300, Intensity: Mild, Exercise limited by: None identified   Goals Addressed             This Visit's Progress    Maintain healthy active lifestyle.   On track      Depression Screen PHQ 2/9 Scores 09/12/2021 09/12/2021 03/14/2021 08/29/2020 08/18/2019 08/17/2019 06/22/2018  PHQ - 2 Score 0 0 0 0 0 0 0    Fall Risk Fall Risk  09/12/2021 08/29/2020 08/18/2019 08/17/2019 06/22/2018  Falls in the past year? 0 0 0 0 0  Number falls in past yr: 0 0 0 - -  Injury with Fall? 0 0 0 - -  Risk for fall due to : No Fall Risks - - - -  Follow up Falls evaluation completed Falls  prevention discussed Education provided;Falls prevention discussed Falls evaluation completed Falls evaluation completed    FALL RISK PREVENTION PERTAINING TO THE HOME:  Any stairs in or around the home? Yes  If so, are there any without handrails? No  Home free of loose throw rugs in walkways, pet beds, electrical cords, etc? Yes  Adequate lighting in your home to reduce risk of falls? Yes   ASSISTIVE DEVICES UTILIZED TO PREVENT FALLS:  Life alert? No  Use of a cane, walker or w/c? No  Grab bars in the bathroom? Yes  Shower chair or bench in shower? Yes  Elevated toilet seat or a handicapped toilet? Yes   Cognitive Function:    Normal cognitive status assessed by direct observation by this Nurse Health Advisor. No abnormalities found.      Immunizations Immunization History  Administered Date(s) Administered   Fluad Quad(high Dose 65+) 04/05/2019   Influenza,inj,Quad PF,6+ Mos 04/25/2015, 03/15/2017, 04/28/2018   Influenza-Unspecified 06/19/2020, 05/21/2021   PFIZER(Purple Top)SARS-COV-2 Vaccination 08/10/2019, 08/28/2019, 05/16/2020   Pneumococcal Conjugate-13 06/22/2018   Pneumococcal Polysaccharide-23 02/25/2020   Rabies, IM 02/22/2012, 02/25/2012, 02/29/2012, 03/07/2012   Rabies, intradermal 02/22/2012   Td 01/16/2005   Tdap 09/30/2016   Zoster Recombinat (Shingrix) 04/28/2018, 07/21/2018    TDAP status: Up to date  Flu Vaccine status: Up to date  Pneumococcal vaccine status: Up to date  Covid-19 vaccine status: Completed vaccines  Qualifies for Shingles Vaccine? Yes   Zostavax completed No   Shingrix Completed?: Yes  Screening Tests Health Maintenance  Topic Date Due   COVID-19 Vaccine (4 - Booster for Pfizer series) 07/11/2020   MAMMOGRAM  05/16/2022   COLONOSCOPY (Pts 45-38yrs Insurance coverage will need to be confirmed)  02/10/2024   TETANUS/TDAP  10/01/2026   Pneumonia Vaccine 17+ Years old  Completed   INFLUENZA VACCINE  Completed   DEXA SCAN   Completed   Hepatitis C Screening  Completed   Zoster Vaccines- Shingrix  Completed   HPV VACCINES  Aged Out  Health Maintenance  Health Maintenance Due  Topic Date Due   COVID-19 Vaccine (4 - Booster for Pfizer series) 07/11/2020    Colorectal cancer screening: Type of screening: Colonoscopy. Completed 02/10/2019. Repeat every 5 years  Mammogram status: Completed 05/16/2021. Repeat every year  Bone Density status: Completed 12/27/2020. Results reflect: Bone density results: OSTEOPENIA. Repeat every 5 years.  Lung Cancer Screening: (Low  CT Chest recommended if Age 13-80 years, 30 pack-year currently smoking OR have quit w/in 15years.) does not qualify.   Lung Cancer Screening Referral: n/a  Additional Screening:  Hepatitis C Screening: does not qualify; Completed 02/28/2016  Vision Screening: Recommended annual ophthalmology exams for early detection of glaucoma and other disorders of the eye. Is the patient up to date with their annual eye exam?  Yes  Who is the provider or what is the name of the office in which the patient attends annual eye exams? [facility]  If pt is not established with a provider, would they like to be referred to a provider to establish care? No .   Dental Screening: Recommended annual dental exams for proper oral hygiene  Community Resource Referral / Chronic Care Management: CRR required this visit?  No   CCM required this visit?  No      Plan:     I have personally reviewed and noted the following in the patients chart:   Medical and social history Use of alcohol, tobacco or illicit drugs  Current medications and supplements including opioid prescriptions.  Functional ability and status Nutritional status Physical activity Advanced directives List of other physicians Hospitalizations, surgeries, and ER visits in previous 12 months Vitals Screenings to include cognitive, depression, and falls Referrals and appointments  In  addition, I have reviewed and discussed with patient certain preventive protocols, quality metrics, and best practice recommendations. A written personalized care plan for preventive services as well as general preventive health recommendations were provided to patient.     Randel Pigg, LPN   4/50/3888   Nurse Notes: none

## 2021-09-13 ENCOUNTER — Encounter: Payer: Self-pay | Admitting: Family Medicine

## 2021-09-13 ENCOUNTER — Other Ambulatory Visit: Payer: Self-pay | Admitting: Physical Therapy

## 2021-09-13 ENCOUNTER — Encounter: Payer: Self-pay | Admitting: Nurse Practitioner

## 2021-09-13 MED ORDER — TIZANIDINE HCL 4 MG PO TABS: 2.0000 mg | ORAL_TABLET | Freq: Four times a day (QID) | ORAL | 1 refills | Status: DC | PRN

## 2021-09-13 NOTE — Telephone Encounter (Signed)
PMP database show last 3 fill dates as follows  06/15/2021 04/24/2021 03/16/2021  Last seen 03/14/2021 No upcoming appointments, 09/26/21 appt canceled

## 2021-09-14 MED ORDER — PREDNISONE 10 MG PO TABS: 30.0000 mg | ORAL_TABLET | Freq: Every day | ORAL | 0 refills | Status: DC

## 2021-09-20 ENCOUNTER — Encounter: Payer: Self-pay | Admitting: Nurse Practitioner

## 2021-09-20 ENCOUNTER — Encounter: Payer: Self-pay | Admitting: Family Medicine

## 2021-09-24 ENCOUNTER — Telehealth: Payer: Self-pay | Admitting: Nurse Practitioner

## 2021-09-24 ENCOUNTER — Encounter: Payer: Self-pay | Admitting: Nurse Practitioner

## 2021-09-24 ENCOUNTER — Ambulatory Visit (INDEPENDENT_AMBULATORY_CARE_PROVIDER_SITE_OTHER): Payer: Medicare PPO

## 2021-09-24 ENCOUNTER — Other Ambulatory Visit: Payer: Self-pay

## 2021-09-24 ENCOUNTER — Ambulatory Visit (INDEPENDENT_AMBULATORY_CARE_PROVIDER_SITE_OTHER): Payer: Medicare PPO | Admitting: Nurse Practitioner

## 2021-09-24 VITALS — BP 100/64 | HR 70 | Temp 97.7°F | Ht 60.0 in | Wt 125.2 lb

## 2021-09-24 DIAGNOSIS — L821 Other seborrheic keratosis: Secondary | ICD-10-CM | POA: Insufficient documentation

## 2021-09-24 DIAGNOSIS — L719 Rosacea, unspecified: Secondary | ICD-10-CM | POA: Diagnosis not present

## 2021-09-24 DIAGNOSIS — F411 Generalized anxiety disorder: Secondary | ICD-10-CM | POA: Diagnosis not present

## 2021-09-24 DIAGNOSIS — M542 Cervicalgia: Secondary | ICD-10-CM

## 2021-09-24 LAB — BASIC METABOLIC PANEL
BUN: 17 mg/dL (ref 6–23)
CO2: 29 mEq/L (ref 19–32)
Calcium: 9.2 mg/dL (ref 8.4–10.5)
Chloride: 101 mEq/L (ref 96–112)
Creatinine, Ser: 0.8 mg/dL (ref 0.40–1.20)
GFR: 75.73 mL/min (ref >60.00)
Glucose, Bld: 69 mg/dL — ABNORMAL LOW (ref 70–99)
Potassium: 4.7 mEq/L (ref 3.5–5.1)
Sodium: 137 mEq/L (ref 135–145)

## 2021-09-24 MED ORDER — ALPRAZOLAM 0.25 MG PO TABS: 0.2500 mg | ORAL_TABLET | Freq: Every day | ORAL | 1 refills | Status: DC | PRN

## 2021-09-24 MED ORDER — GABAPENTIN 100 MG PO CAPS: 100.0000 mg | ORAL_CAPSULE | Freq: Every day | ORAL | 5 refills | Status: DC

## 2021-09-24 MED ORDER — MELOXICAM 7.5 MG PO TABS: 7.5000 mg | ORAL_TABLET | Freq: Every day | ORAL | 2 refills | Status: DC

## 2021-09-24 MED ORDER — SPIRONOLACTONE 100 MG PO TABS: ORAL_TABLET | ORAL | 1 refills | Status: DC

## 2021-09-24 NOTE — Patient Instructions (Signed)
Go to lab for blood draw and x-ray ?Start neck and shoulder stretches. ? ? ?

## 2021-09-24 NOTE — Telephone Encounter (Signed)
Pt stopped by after appointment and said she has questions for [place] or [place]. Wants to know what she should do about the pain during the day, since she will take the Gabapentin at night.  ?Also is curious of next steps if this does not work.  ?

## 2021-09-24 NOTE — Progress Notes (Addendum)
? ?Subjective:  ?Patient ID: [name], female    DOB: [birth year]  Age: 71 y.o. MRN: [id number] ? ?CC: Follow-up (Pt would like to follow up on neck pain and medication refills (Alprazolam)./Pt states pain started in back but has improved there and she is now having neck pain. ) ? ?HPI ?Acne rosacea ?Use of spironolactone daily ?Lesions resolved ?Maintain med dose ?Check BMP ? ?Anxiety ?Stable mood with use of alprazolam prn since 05/12/2020. ?Per PMP database: alprazolam 0.'25mg'$ , 30tabs each: 12/15/20, 01/26/21, 03/16/21, 04/24/21, 06/15/21, 09/13/21. ? ?Maintain med dose ?Refill sent to be filled 10/11/21 ? ?Neck pain, bilateral posterior ?Acute on chronic, current episode started after Zumba class. ?Mid back pain radiates to neck, onset 2weeks ago ?recently completed oral prednisone, ibuprofen, zanaflaex, and use of TENs machine. (some improvement) ?She stays active. Exercise at the GYM: 2-3x/week (cardio and walking) ?Pain Affects sleep ?She had similar symptoms last year, treated with outpatient PT, ibuprofen and muscle relaxant. ?We discussed need for stretch exercise before and after exercise, and use of flexeril in place of zanaflex. She opted to use gabapentin in place of muscle relaxant. She also requested cervical spine x-rays despite absence of muscle weakness, atrophy, loss of ROM, and paresthesia. ?Hx of osteopenia, last dexa scan 02/2021. ? ?Sent gabapentin, advised about possible side effects. ?Ordered cervical spine x-ray. ?Advised to start neck and shoulder stretch exercises before and after exercise. ?Stable renal function. ?Pending x-ray reports ?Add meloxicam 7.'5mg'$  in AM with food.  ?Advised not to take any NSAIDs (advil, aleve, naproxen, ibuprofen) while using meloxicam. ? ? ?Reviewed past Medical, Social and Family history today. ? ?Outpatient Medications Prior to Visit  ?Medication Sig Dispense Refill  ? aspirin EC 81 MG tablet Take 1 tablet (81 mg total) by mouth daily.    ? Biotin  1000 MCG tablet Take 1 tablet (1 mg total) by mouth daily. 90 tablet 3  ? Calcium Carbonate-Vitamin D 600-400 MG-UNIT tablet Take 1 tablet by mouth 2 (two) times daily. 180 tablet 3  ? Omega-3 Fatty Acids (FISH OIL) 1000 MG CAPS Take 2qd 180 capsule 3  ? pantoprazole (PROTONIX) 40 MG tablet TAKE 1 TABLET (40 MG TOTAL) BY MOUTH 2 (TWO) TIMES DAILY BEFORE A MEAL. 180 tablet 1  ? pravastatin (PRAVACHOL) 80 MG tablet Take 1 tablet (80 mg total) by mouth every evening. 90 tablet 3  ? tretinoin (RETIN-A) 0.025 % cream Apply topically at bedtime. 45 g 11  ? ALPRAZolam (XANAX) 0.25 MG tablet TAKE 1 TABLET BY MOUTH AT BEDTIME AS NEEDED FOR ANXIETY. Need office visit for Additional refills 30 tablet 0  ? predniSONE (DELTASONE) 10 MG tablet Take 3 tablets (30 mg total) by mouth daily with breakfast. 15 tablet 0  ? spironolactone (ALDACTONE) 100 MG tablet TAKE 1 TABLET BY MOUTH EVERY DAY 90 tablet 1  ? tiZANidine (ZANAFLEX) 4 MG tablet Take 0.5-1 tablets (2-4 mg total) by mouth every 6 (six) hours as needed. 60 tablet 1  ? ?No facility-administered medications prior to visit.  ? ? ?ROS ?See HPI ? ?Objective:  ?BP 100/64 (BP Location: Left Arm, Patient Position: Sitting, Cuff Size: Normal)   Pulse 70   Temp 97.7 ?F (36.5 ?C) (Temporal)   Ht 5' (1.524 m)   Wt 125 lb 3.2 oz (56.8 kg)   SpO2 100%   BMI 24.45 kg/m?  ? ?Physical Exam ?Constitutional:   ?   General: She is not in acute distress. ?Cardiovascular:  ?   Rate and Rhythm:  Normal rate.  ?Pulmonary:  ?   Effort: Pulmonary effort is normal.  ?Musculoskeletal:  ?   Right shoulder: Normal.  ?   Left shoulder: Normal.  ?   Right upper arm: Normal.  ?   Left upper arm: Normal.  ?   Cervical back: Tenderness present. No rigidity, spasms, torticollis, bony tenderness or crepitus. Muscular tenderness present. No pain with movement or spinous process tenderness. Normal range of motion.  ?   Thoracic back: Normal.  ?   Lumbar back: Normal.  ?Lymphadenopathy:  ?   Cervical: No  cervical adenopathy.  ?Neurological:  ?   Mental Status: She is alert.  ? ?Assessment & Plan:  ?This visit occurred during the SARS-CoV-2 public health emergency.  Safety protocols were in place, including screening questions prior to the visit, additional usage of staff PPE, and extensive cleaning of exam room while observing appropriate contact time as indicated for disinfecting solutions.  ? ?[place] was seen today for follow-up. ? ?Diagnoses and all orders for this visit: ? ?GAD (generalized anxiety disorder) ?-     ALPRAZolam (XANAX) 0.25 MG tablet; Take 1 tablet (0.25 mg total) by mouth daily as needed for anxiety. TAKE 1 TABLET BY MOUTH AT BEDTIME AS NEEDED FOR ANXIETY. ? ?Neck pain, bilateral posterior ?-     gabapentin (NEURONTIN) 100 MG capsule; Take 1 capsule (100 mg total) by mouth at bedtime. ?-     DG Cervical Spine Complete ?-     meloxicam (MOBIC) 7.5 MG tablet; Take 1 tablet (7.5 mg total) by mouth daily. With food ? ?Acne rosacea ?-     Basic metabolic panel ?-     spironolactone (ALDACTONE) 100 MG tablet; TAKE 1 TABLET BY MOUTH EVERY DAY ? ? ?Problem List Items Addressed This Visit   ? ?  ? Musculoskeletal and Integument  ? Acne rosacea  ?  Use of spironolactone daily ?Lesions resolved ?Maintain med dose ?Check BMP ?  ?  ? Relevant Medications  ? spironolactone (ALDACTONE) 100 MG tablet  ? Other Relevant Orders  ? Basic metabolic panel (Completed)  ?  ? Other  ? Anxiety - Primary  ?  Stable mood with use of alprazolam prn since 05/12/2020. ?Per PMP database: alprazolam 0.'25mg'$ , 30tabs each: 12/15/20, 01/26/21, 03/16/21, 04/24/21, 06/15/21, 09/13/21. ? ?Maintain med dose ?Refill sent to be filled 10/11/21 ?  ?  ? Relevant Medications  ? ALPRAZolam (XANAX) 0.25 MG tablet (Start on 10/11/2021)  ? Neck pain, bilateral posterior  ?  Acute on chronic, current episode started after Zumba class. ?Mid back pain radiates to neck, onset 2weeks ago ?recently completed oral prednisone, ibuprofen, zanaflaex,  and use of TENs machine. (some improvement) ?She stays active. Exercise at the GYM: 2-3x/week (cardio and walking) ?Pain Affects sleep ?She had similar symptoms last year, treated with outpatient PT, ibuprofen and muscle relaxant. ?We discussed need for stretch exercise before and after exercise, and use of flexeril in place of zanaflex. She opted to use gabapentin in place of muscle relaxant. She also requested cervical spine x-rays despite absence of muscle weakness, atrophy, loss of ROM, and paresthesia. ?Hx of osteopenia, last dexa scan 02/2021. ? ?Sent gabapentin, advised about possible side effects. ?Ordered cervical spine x-ray. ?Advised to start neck and shoulder stretch exercises before and after exercise. ?Stable renal function. ?Pending x-ray reports ?Add meloxicam 7.'5mg'$  in AM with food.  ?Advised not to take any NSAIDs (advil, aleve, naproxen, ibuprofen) while using meloxicam. ? ?  ?  ? Relevant  Medications  ? gabapentin (NEURONTIN) 100 MG capsule  ? meloxicam (MOBIC) 7.5 MG tablet  ? Other Relevant Orders  ? DG Cervical Spine Complete  ?  ?Follow-up: Return if symptoms worsen or fail to improve. ? ?Wilfred Lacy, NP ?

## 2021-09-24 NOTE — Assessment & Plan Note (Addendum)
Acute on chronic, current episode started after Zumba class. ?Mid back pain radiates to neck, onset 2weeks ago ?recently completed oral prednisone, ibuprofen, zanaflaex, and use of TENs machine. (some improvement) ?She stays active. Exercise at the GYM: 2-3x/week (cardio and walking) ?Pain Affects sleep ?She had similar symptoms last year, treated with outpatient PT, ibuprofen and muscle relaxant. ?We discussed need for stretch exercise before and after exercise, and use of flexeril in place of zanaflex. She opted to use gabapentin in place of muscle relaxant. She also requested cervical spine x-rays despite absence of muscle weakness, atrophy, loss of ROM, and paresthesia. ?Hx of osteopenia, last dexa scan 02/2021. ? ?Sent gabapentin, advised about possible side effects. ?Ordered cervical spine x-ray. ?Advised to start neck and shoulder stretch exercises before and after exercise. ?Stable renal function. ?Pending x-ray reports ?Add meloxicam 7.'5mg'$  in AM with food.  ?Advised not to take any NSAIDs (advil, aleve, naproxen, ibuprofen) while using meloxicam. ? ?

## 2021-09-24 NOTE — Addendum Note (Signed)
Addended by: Leana Gamer on: 09/24/2021 02:37 PM ? ? Modules accepted: Orders ? ?

## 2021-09-24 NOTE — Assessment & Plan Note (Signed)
Use of spironolactone daily ?Lesions resolved ?Maintain med dose ?Check BMP ?

## 2021-09-24 NOTE — Assessment & Plan Note (Signed)
Stable mood with use of alprazolam prn since 05/12/2020. ?Per PMP database: alprazolam 0.'25mg'$ , 30tabs each: 12/15/20, 01/26/21, 03/16/21, 04/24/21, 06/15/21, 09/13/21. ? ?Maintain med dose ?Refill sent to be filled 10/11/21 ?

## 2021-09-26 ENCOUNTER — Ambulatory Visit: Payer: Medicare PPO

## 2021-09-26 ENCOUNTER — Encounter: Payer: Self-pay | Admitting: Nurse Practitioner

## 2021-09-27 ENCOUNTER — Encounter: Payer: Self-pay | Admitting: Nurse Practitioner

## 2021-10-02 ENCOUNTER — Encounter: Payer: Self-pay | Admitting: Nurse Practitioner

## 2021-10-02 DIAGNOSIS — M542 Cervicalgia: Secondary | ICD-10-CM

## 2021-10-10 ENCOUNTER — Encounter: Payer: Self-pay | Admitting: Nurse Practitioner

## 2021-10-10 DIAGNOSIS — L719 Rosacea, unspecified: Secondary | ICD-10-CM

## 2021-10-11 ENCOUNTER — Encounter: Payer: Self-pay | Admitting: Nurse Practitioner

## 2021-10-11 MED ORDER — TRETINOIN 0.025 % EX CREA: TOPICAL_CREAM | Freq: Every day | CUTANEOUS | 11 refills | Status: AC

## 2021-10-16 ENCOUNTER — Encounter: Payer: Self-pay | Admitting: Nurse Practitioner

## 2021-10-17 ENCOUNTER — Other Ambulatory Visit: Payer: Self-pay

## 2021-10-17 DIAGNOSIS — K219 Gastro-esophageal reflux disease without esophagitis: Secondary | ICD-10-CM

## 2021-10-17 MED ORDER — PANTOPRAZOLE SODIUM 40 MG PO TBEC: 40.0000 mg | DELAYED_RELEASE_TABLET | Freq: Two times a day (BID) | ORAL | 1 refills | Status: DC

## 2021-10-17 NOTE — Telephone Encounter (Signed)
Chart supports Rx ?Last seen 09/24/21 ?No future appointment scheduled.  ?

## 2021-10-17 NOTE — Addendum Note (Signed)
Addended by: Renette Butters on: 10/17/2021 11:46 AM ? ? Modules accepted: Orders ? ?

## 2021-10-26 ENCOUNTER — Ambulatory Visit: Payer: Medicare PPO | Admitting: Internal Medicine

## 2021-10-30 DIAGNOSIS — H2512 Age-related nuclear cataract, left eye: Secondary | ICD-10-CM | POA: Diagnosis not present

## 2021-11-05 ENCOUNTER — Encounter: Payer: Self-pay | Admitting: Cardiology

## 2021-11-06 ENCOUNTER — Other Ambulatory Visit: Payer: Self-pay

## 2021-11-06 DIAGNOSIS — E785 Hyperlipidemia, unspecified: Secondary | ICD-10-CM | POA: Diagnosis not present

## 2021-11-07 LAB — LIPID PANEL
Chol/HDL Ratio: 2.3 ratio (ref 0.0–4.4)
Cholesterol, Total: 252 mg/dL — ABNORMAL HIGH (ref 100–199)
HDL: 109 mg/dL (ref >39)
LDL Chol Calc (NIH): 130 mg/dL — ABNORMAL HIGH (ref 0–99)
Triglycerides: 78 mg/dL (ref 0–149)
VLDL Cholesterol Cal: 13 mg/dL (ref 5–40)

## 2021-11-12 ENCOUNTER — Encounter: Payer: Self-pay | Admitting: Cardiology

## 2021-11-12 DIAGNOSIS — E785 Hyperlipidemia, unspecified: Secondary | ICD-10-CM

## 2021-11-14 MED ORDER — ROSUVASTATIN CALCIUM 20 MG PO TABS: 20.0000 mg | ORAL_TABLET | Freq: Every day | ORAL | 3 refills | Status: DC

## 2021-11-14 NOTE — Addendum Note (Signed)
Addended by: Rexanne Mano B on: 11/14/2021 05:05 PM ? ? Modules accepted: Orders ? ?

## 2021-12-20 ENCOUNTER — Other Ambulatory Visit: Payer: Self-pay

## 2021-12-20 ENCOUNTER — Encounter: Payer: Self-pay | Admitting: Nurse Practitioner

## 2021-12-20 DIAGNOSIS — Z8619 Personal history of other infectious and parasitic diseases: Secondary | ICD-10-CM

## 2021-12-20 MED ORDER — VALACYCLOVIR HCL 1 G PO TABS
1000.0000 mg | ORAL_TABLET | Freq: Two times a day (BID) | ORAL | 0 refills | Status: DC
Start: 2021-12-20 — End: 2022-04-01

## 2021-12-20 NOTE — Telephone Encounter (Signed)
Pt does take PRN and will be going out of town next week. Last RX was  02/2021.

## 2021-12-20 NOTE — Telephone Encounter (Signed)
Called pt, left VM. Needs an appt to get a new Rx.

## 2022-01-28 ENCOUNTER — Other Ambulatory Visit: Payer: Self-pay

## 2022-01-28 DIAGNOSIS — E785 Hyperlipidemia, unspecified: Secondary | ICD-10-CM | POA: Diagnosis not present

## 2022-01-28 LAB — LIPID PANEL
Chol/HDL Ratio: 2.3 ratio (ref 0.0–4.4)
Cholesterol, Total: 208 mg/dL — ABNORMAL HIGH (ref 100–199)
HDL: 91 mg/dL (ref >39)
LDL Chol Calc (NIH): 104 mg/dL — ABNORMAL HIGH (ref 0–99)
Triglycerides: 73 mg/dL (ref 0–149)
VLDL Cholesterol Cal: 13 mg/dL (ref 5–40)

## 2022-02-01 ENCOUNTER — Telehealth: Payer: Self-pay | Admitting: Cardiology

## 2022-02-01 NOTE — Telephone Encounter (Signed)
Patient calling with question concerning her labs. Please advise

## 2022-02-01 NOTE — Telephone Encounter (Signed)
Minus Breeding, MD  01/30/2022  9:03 AM EDT     Her cholesterol is improved.  Her LDL is much better though still mildly elevated.  HDL is excellent.  I think she could stay on the current dose of Crestor and omega-3's.  She does have some aortic atherosclerosis per coronary calcium score was 0.  If there is room to move with any cholesterol in her diet being reduced moving closer towards a Mediterranean plant-based diet I would suggest this. Call Patricia Hampton with the results and send results to Nche, Charlene Brooke, NP    Left message to call back

## 2022-02-04 ENCOUNTER — Encounter: Payer: Self-pay | Admitting: *Deleted

## 2022-02-04 NOTE — Telephone Encounter (Signed)
Message sent to patient via Mychart.

## 2022-02-04 NOTE — Telephone Encounter (Signed)
Spoke to patient, aware of results and recommendations.  Patient in interested in more education and cooking classes for the mediterranean diet.     Advised I would try to get some resources and follow up.   Patient request message via Mychart.

## 2022-02-04 NOTE — Telephone Encounter (Signed)
Patient is returning call.  °

## 2022-02-08 ENCOUNTER — Other Ambulatory Visit: Payer: Self-pay | Admitting: Nurse Practitioner

## 2022-02-08 DIAGNOSIS — F411 Generalized anxiety disorder: Secondary | ICD-10-CM

## 2022-03-15 ENCOUNTER — Encounter (INDEPENDENT_AMBULATORY_CARE_PROVIDER_SITE_OTHER): Payer: Medicare PPO | Admitting: Nurse Practitioner

## 2022-03-15 DIAGNOSIS — T63481A Toxic effect of venom of other arthropod, accidental (unintentional), initial encounter: Secondary | ICD-10-CM | POA: Diagnosis not present

## 2022-03-15 MED ORDER — EPINEPHRINE 0.3 MG/0.3ML IJ SOAJ: 0.3000 mg | INTRAMUSCULAR | 1 refills | Status: DC | PRN

## 2022-03-15 MED ORDER — PREDNISONE 10 MG (21) PO TBPK: ORAL_TABLET | ORAL | 0 refills | Status: DC

## 2022-03-15 NOTE — Telephone Encounter (Signed)
Caller Name: Cimone Fahey Call back phone #: [phone]  Reason for Call: Pt has received a bee sting, please refer to message and picture below

## 2022-03-15 NOTE — Telephone Encounter (Signed)
Please see the MyChart message reply(ies) for my assessment and plan.  The patient gave consent for this Medical Advice Message and is aware that it may result in a bill to their insurance company as well as the possibility that this may result in a co-payment or deductible. They are an established patient, but are not seeking medical advice exclusively about a problem treated during an in person or video visit in the last 7 days. I did not recommend an in person or video visit within 7 days of my reply.  I spent a total of 20minutes cumulative time within 7 days through MyChart messaging  Muscab Brenneman, NP  

## 2022-04-01 ENCOUNTER — Other Ambulatory Visit: Payer: Self-pay | Admitting: Nurse Practitioner

## 2022-04-01 DIAGNOSIS — Z8619 Personal history of other infectious and parasitic diseases: Secondary | ICD-10-CM

## 2022-04-01 MED ORDER — VALACYCLOVIR HCL 1 G PO TABS: 1000.0000 mg | ORAL_TABLET | Freq: Two times a day (BID) | ORAL | 0 refills | Status: DC

## 2022-04-01 NOTE — Telephone Encounter (Signed)
Chart supports Rx Last OV: 09/2021 Next OV: not scheduled   

## 2022-04-08 ENCOUNTER — Encounter: Payer: Self-pay | Admitting: Nurse Practitioner

## 2022-04-10 ENCOUNTER — Other Ambulatory Visit: Payer: Self-pay | Admitting: Nurse Practitioner

## 2022-04-10 DIAGNOSIS — Z1231 Encounter for screening mammogram for malignant neoplasm of breast: Secondary | ICD-10-CM

## 2022-04-11 ENCOUNTER — Encounter: Payer: Self-pay | Admitting: Nurse Practitioner

## 2022-04-11 ENCOUNTER — Ambulatory Visit (INDEPENDENT_AMBULATORY_CARE_PROVIDER_SITE_OTHER): Payer: Medicare PPO | Admitting: Nurse Practitioner

## 2022-04-11 VITALS — BP 112/76 | HR 80 | Temp 97.4°F | Ht 60.0 in | Wt 122.0 lb

## 2022-04-11 DIAGNOSIS — G44209 Tension-type headache, unspecified, not intractable: Secondary | ICD-10-CM | POA: Diagnosis not present

## 2022-04-11 DIAGNOSIS — Z23 Encounter for immunization: Secondary | ICD-10-CM | POA: Diagnosis not present

## 2022-04-11 MED ORDER — BUTALBITAL-APAP-CAFFEINE 50-325-40 MG PO TABS: 1.0000 | ORAL_TABLET | Freq: Three times a day (TID) | ORAL | 0 refills | Status: DC | PRN

## 2022-04-11 NOTE — Patient Instructions (Signed)
Use fioricet for headache Call office if no improvement in 24hrs.  Tension Headache, Adult A tension headache is a feeling of pain, pressure, or aching in the head. It is often felt over the front and sides of the head. Tension headaches can last from 30 minutes to several days. What are the causes? The cause of this condition is not known. Sometimes, tension headaches are brought on by stress, worry (anxiety), or depression. Other things that may set them off include: Alcohol. Too much caffeine or caffeine withdrawal. Colds, flu, or sinus infections. Dental problems. This can include clenching your teeth. Being tired. Holding your head and neck in the same position for a long time, such as while using a computer. Smoking. Arthritis in the neck. What are the signs or symptoms? Feeling pressure around the head. A dull ache in the head. Pain over the front and sides of the head. Feeling sore or tender in the muscles of the head, neck, and shoulders. How is this treated? This condition may be treated with lifestyle changes and with medicines that help relieve symptoms. Follow these instructions at home: Managing pain Take over-the-counter and prescription medicines only as told by your doctor. When you have a headache, lie down in a dark, quiet room. If told, put ice on your head and neck. To do this: Put ice in a plastic bag. Place a towel between your skin and the bag. Leave the ice on for 20 minutes, 2-3 times a day. Take off the ice if your skin turns bright red. This is very important. If you cannot feel pain, heat, or cold, you have a greater risk of damage to the area. If told, put heat on the back of your neck. Do this as often as told by your doctor. Use the heat source that your doctor recommends, such as a moist heat pack or a heating pad. Place a towel between your skin and the heat source. Leave the heat on for 20-30 minutes. Take off the heat if your skin turns bright  red. This is very important. If you cannot feel pain, heat, or cold, you have a greater risk of getting burned. Eating and drinking Eat meals on a regular schedule. If you drink alcohol: Limit how much you have to: 0-1 drink a day for women who are not pregnant. 0-2 drinks a day for men. Know how much alcohol is in your drink. In the [place] one drink equals one 12 oz bottle of beer (355 mL), one 5 oz glass of wine (148 mL), or one 1 oz glass of hard liquor (44 mL). Drink enough fluid to keep your pee (urine) pale yellow. Do not use a lot of caffeine, or stop using caffeine. Lifestyle Get 7-9 hours of sleep each night. Or get the amount of sleep that your doctor tells you to. At bedtime, keep computers, phones, and tablets out of your room. Find ways to lessen your stress. This may include: Exercise. Deep breathing. Yoga. Listening to music. Thinking positive thoughts. Sit up straight. Try to relax your muscles. Do not smoke or use any products that contain nicotine or tobacco. If you need help quitting, ask your doctor. General instructions  Avoid things that can bring on headaches. Keep a headache journal to see what may bring on headaches. For example, write down: What you eat and drink. How much sleep you get. Any change to your diet or medicines. Keep all follow-up visits. Contact a doctor if: Your headache does not  get better. Your headache comes back. You have a headache, and sounds, light, or smells bother you. You feel like you may vomit, or you vomit. Your stomach hurts. Get help right away if: You all of a sudden get a very bad headache with any of these things: A stiff neck. Feeling like you may vomit. Vomiting. Feeling mixed up (confused). Feeling weak in one part or one side of your body. Having trouble seeing or speaking, or both. Feeling short of breath. A rash. Feeling very sleepy. Pain in your eye or ear. Trouble walking or balancing. Feeling like you  will faint, or you faint. Summary A tension headache is pain, pressure, or aching in your head. Tension headaches can last from 30 minutes to several days. Lifestyle changes and medicines may help relieve pain. This information is not intended to replace advice given to you by your health care provider. Make sure you discuss any questions you have with your health care provider. Document Revised: 04/06/2020 Document Reviewed: 04/06/2020 Elsevier Patient Education  [place].

## 2022-04-11 NOTE — Progress Notes (Signed)
Established Patient Visit  Patient: Patricia Hampton   DOB: 30-Nov-1950   71 y.o. Female  MRN: [id number] Visit Date: 04/11/2022  Subjective:    Chief Complaint  Patient presents with   Acute Visit    C/o headaches x 9 days, possible referral to neurology Has been taking otc pain relief, hasn't helped No other concerns    Headache  This is a new problem. The current episode started 1 to 4 weeks ago. The problem occurs constantly. The problem has been waxing and waning. The pain is located in the Occipital region. The pain does not radiate. The pain quality is not similar to prior headaches. Quality: pressure. The pain is at a severity of 6/10. Pertinent negatives include no abdominal pain, abnormal behavior, anorexia, back pain, blurred vision, coughing, dizziness, drainage, ear pain, eye pain, eye redness, eye watering, facial sweating, fever, hearing loss, insomnia, loss of balance, muscle aches, nausea, neck pain, numbness, phonophobia, photophobia, rhinorrhea, scalp tenderness, seizures, sinus pressure, sore throat, swollen glands, tingling, tinnitus, visual change, vomiting, weakness or weight loss. Nothing aggravates the symptoms. She has tried acetaminophen and NSAIDs for the symptoms. The treatment provided mild relief. There is no history of cancer, cluster headaches, hypertension, immunosuppression, migraine headaches, migraines in the family, obesity, pseudotumor cerebri, recent head traumas, sinus disease or TMJ.   No problem-specific Assessment & Plan notes found for this encounter. Onset 9days ago, occipital region, some relief with pressure No relief with ibuprofen 400-'600mg'$ , mobic 7.'5mg'$ , tand tylenol '650mg'$   Waxing and waning Sleep ok No change in vision   Reviewed medical, surgical, and social history today  Medications: Outpatient Medications Prior to Visit  Medication Sig   ALPRAZolam (XANAX) 0.25 MG tablet Take 1 tablet (0.25 mg total) by mouth at  bedtime as needed for anxiety. Need appt for additional refills   aspirin EC 81 MG tablet Take 1 tablet (81 mg total) by mouth daily.   Biotin 1000 MCG tablet Take 1 tablet (1 mg total) by mouth daily.   Calcium Carbonate-Vitamin D 600-400 MG-UNIT tablet Take 1 tablet by mouth 2 (two) times daily.   EPINEPHrine (EPIPEN 2-PAK) 0.3 mg/0.3 mL IJ SOAJ injection Inject 0.3 mg into the muscle as needed for anaphylaxis.   gabapentin (NEURONTIN) 100 MG capsule Take 1 capsule (100 mg total) by mouth at bedtime.   meloxicam (MOBIC) 7.5 MG tablet Take 1 tablet (7.5 mg total) by mouth daily. With food   Omega-3 Fatty Acids (FISH OIL) 1000 MG CAPS Take 2qd   pantoprazole (PROTONIX) 40 MG tablet Take 1 tablet (40 mg total) by mouth 2 (two) times daily before a meal.   rosuvastatin (CRESTOR) 20 MG tablet Take 1 tablet (20 mg total) by mouth daily.   spironolactone (ALDACTONE) 100 MG tablet TAKE 1 TABLET BY MOUTH EVERY DAY   tretinoin (RETIN-A) 0.025 % cream Apply topically at bedtime.   valACYclovir (VALTREX) 1000 MG tablet Take 1 tablet (1,000 mg total) by mouth 2 (two) times daily. For Episodic treatment   [DISCONTINUED] predniSONE (STERAPRED UNI-PAK 21 TAB) 10 MG (21) TBPK tablet As directed on package (Patient not taking: Reported on 04/11/2022)   No facility-administered medications prior to visit.   Reviewed past medical and social history.   ROS per HPI above      Objective:  BP 112/76 (BP Location: Right Arm, Patient Position: Sitting, Cuff Size: Small)   Pulse 80   Temp Marland Kitchen)  97.4 F (36.3 C) (Temporal)   Ht 5' (1.524 m)   Wt 122 lb (55.3 kg)   SpO2 92%   BMI 23.83 kg/m      Physical Exam Vitals reviewed.  Eyes:     Extraocular Movements: Extraocular movements intact.     Conjunctiva/sclera: Conjunctivae normal.     Pupils: Pupils are equal, round, and reactive to light.  Cardiovascular:     Rate and Rhythm: Normal rate and regular rhythm.     Pulses: Normal pulses.     Heart  sounds: Normal heart sounds.  Pulmonary:     Effort: Pulmonary effort is normal.     Breath sounds: Normal breath sounds.  Musculoskeletal:     Cervical back: Normal range of motion and neck supple.  Lymphadenopathy:     Cervical: No cervical adenopathy.  Neurological:     Mental Status: She is alert and oriented to person, place, and time.     Cranial Nerves: No cranial nerve deficit.  Psychiatric:        Mood and Affect: Mood normal.        Behavior: Behavior normal.        Thought Content: Thought content normal.     No results found for any visits on 04/11/22.    Assessment & Plan:    Problem List Items Addressed This Visit   None Visit Diagnoses     Acute non intractable tension-type headache    -  Primary   Relevant Medications   butalbital-acetaminophen-caffeine (FIORICET) 50-325-40 MG tablet   Need for immunization against influenza       Relevant Orders   Flu Vaccine QUAD High Dose(Fluad) (Completed)      Return if symptoms worsen or fail to improve.     Wilfred Lacy, NP

## 2022-04-12 ENCOUNTER — Encounter: Payer: Self-pay | Admitting: Nurse Practitioner

## 2022-04-12 ENCOUNTER — Encounter: Payer: Self-pay | Admitting: Neurology

## 2022-04-12 DIAGNOSIS — G44201 Tension-type headache, unspecified, intractable: Secondary | ICD-10-CM

## 2022-04-12 MED ORDER — ACETAMINOPHEN-CODEINE 300-30 MG PO TABS: 1.0000 | ORAL_TABLET | Freq: Three times a day (TID) | ORAL | 0 refills | Status: DC | PRN

## 2022-04-15 ENCOUNTER — Encounter: Payer: Self-pay | Admitting: Nurse Practitioner

## 2022-04-15 ENCOUNTER — Ambulatory Visit
Admission: RE | Admit: 2022-04-15 | Discharge: 2022-04-15 | Disposition: A | Discharge status: Home / Self Care | Payer: Medicare PPO | Source: Ambulatory Visit | Attending: Nurse Practitioner | Admitting: Nurse Practitioner

## 2022-04-15 DIAGNOSIS — R519 Headache, unspecified: Secondary | ICD-10-CM | POA: Diagnosis not present

## 2022-04-15 DIAGNOSIS — G44201 Tension-type headache, unspecified, intractable: Secondary | ICD-10-CM

## 2022-04-15 MED ORDER — RIZATRIPTAN BENZOATE 5 MG PO TABS: 5.0000 mg | ORAL_TABLET | ORAL | 0 refills | Status: DC | PRN

## 2022-04-18 ENCOUNTER — Encounter: Payer: Self-pay | Admitting: Nurse Practitioner

## 2022-04-18 DIAGNOSIS — M542 Cervicalgia: Secondary | ICD-10-CM

## 2022-04-18 DIAGNOSIS — M5030 Other cervical disc degeneration, unspecified cervical region: Secondary | ICD-10-CM

## 2022-04-24 DIAGNOSIS — M5030 Other cervical disc degeneration, unspecified cervical region: Secondary | ICD-10-CM | POA: Insufficient documentation

## 2022-04-29 DIAGNOSIS — M545 Low back pain, unspecified: Secondary | ICD-10-CM | POA: Diagnosis not present

## 2022-05-02 ENCOUNTER — Other Ambulatory Visit: Payer: Self-pay | Admitting: Nurse Practitioner

## 2022-05-02 DIAGNOSIS — K219 Gastro-esophageal reflux disease without esophagitis: Secondary | ICD-10-CM

## 2022-05-06 NOTE — Telephone Encounter (Signed)
Chart supports Rx Last OV: 03/2022 Next OV: not scheduled  

## 2022-05-14 ENCOUNTER — Encounter: Payer: Self-pay | Admitting: Nurse Practitioner

## 2022-05-14 ENCOUNTER — Other Ambulatory Visit: Payer: Self-pay | Admitting: Nurse Practitioner

## 2022-05-14 DIAGNOSIS — F411 Generalized anxiety disorder: Secondary | ICD-10-CM

## 2022-05-14 DIAGNOSIS — L719 Rosacea, unspecified: Secondary | ICD-10-CM

## 2022-05-14 NOTE — Telephone Encounter (Signed)
Chart supports Rx Last OV: 03/2022 Next OV: not scheduled  

## 2022-06-01 DIAGNOSIS — H6012 Cellulitis of left external ear: Secondary | ICD-10-CM | POA: Diagnosis not present

## 2022-06-01 DIAGNOSIS — Z20822 Contact with and (suspected) exposure to covid-19: Secondary | ICD-10-CM | POA: Diagnosis not present

## 2022-06-04 ENCOUNTER — Encounter: Payer: Self-pay | Admitting: Nurse Practitioner

## 2022-06-04 ENCOUNTER — Ambulatory Visit (INDEPENDENT_AMBULATORY_CARE_PROVIDER_SITE_OTHER): Payer: Medicare PPO | Admitting: Nurse Practitioner

## 2022-06-04 ENCOUNTER — Ambulatory Visit: Payer: Medicare PPO

## 2022-06-04 VITALS — BP 114/68 | HR 77 | Temp 97.1°F | Ht 60.0 in | Wt 122.4 lb

## 2022-06-04 DIAGNOSIS — E782 Mixed hyperlipidemia: Secondary | ICD-10-CM | POA: Diagnosis not present

## 2022-06-04 DIAGNOSIS — K219 Gastro-esophageal reflux disease without esophagitis: Secondary | ICD-10-CM | POA: Diagnosis not present

## 2022-06-04 DIAGNOSIS — M8580 Other specified disorders of bone density and structure, unspecified site: Secondary | ICD-10-CM

## 2022-06-04 DIAGNOSIS — Z0000 Encounter for general adult medical examination without abnormal findings: Secondary | ICD-10-CM

## 2022-06-04 LAB — CBC WITH DIFFERENTIAL/PLATELET
Basophils Absolute: 0 10*3/uL (ref 0.0–0.1)
Basophils Relative: 0.5 % (ref 0.0–3.0)
Eosinophils Absolute: 0.2 10*3/uL (ref 0.0–0.7)
Eosinophils Relative: 3.7 % (ref 0.0–5.0)
HCT: 40.2 % (ref 36.0–46.0)
Hemoglobin: 13.5 g/dL (ref 12.0–15.0)
Lymphocytes Relative: 29.6 % (ref 12.0–46.0)
Lymphs Abs: 1.5 10*3/uL (ref 0.7–4.0)
MCHC: 33.7 g/dL (ref 30.0–36.0)
MCV: 95.9 fl (ref 78.0–100.0)
Monocytes Absolute: 0.6 10*3/uL (ref 0.1–1.0)
Monocytes Relative: 11.6 % (ref 3.0–12.0)
Neutro Abs: 2.8 10*3/uL (ref 1.4–7.7)
Neutrophils Relative %: 54.6 % (ref 43.0–77.0)
Platelets: 276 10*3/uL (ref 150.0–400.0)
RBC: 4.19 Mil/uL (ref 3.87–5.11)
RDW: 14.5 % (ref 11.5–15.5)
WBC: 5.2 10*3/uL (ref 4.0–10.5)

## 2022-06-04 LAB — COMPREHENSIVE METABOLIC PANEL
ALT: 25 U/L (ref 0–35)
AST: 20 U/L (ref 0–37)
Albumin: 4.4 g/dL (ref 3.5–5.2)
Alkaline Phosphatase: 54 U/L (ref 39–117)
BUN: 13 mg/dL (ref 6–23)
CO2: 30 mEq/L (ref 19–32)
Calcium: 9.2 mg/dL (ref 8.4–10.5)
Chloride: 100 mEq/L (ref 96–112)
Creatinine, Ser: 0.66 mg/dL (ref 0.40–1.20)
GFR: 89.72 mL/min (ref >60.00)
Glucose, Bld: 97 mg/dL (ref 70–99)
Potassium: 3.9 mEq/L (ref 3.5–5.1)
Sodium: 136 mEq/L (ref 135–145)
Total Bilirubin: 0.5 mg/dL (ref 0.2–1.2)
Total Protein: 6.4 g/dL (ref 6.0–8.3)

## 2022-06-04 LAB — LIPID PANEL
Cholesterol: 202 mg/dL — ABNORMAL HIGH (ref 0–200)
HDL: 93.7 mg/dL (ref >39.00)
LDL Cholesterol: 91 mg/dL (ref 0–99)
NonHDL: 108.43
Total CHOL/HDL Ratio: 2
Triglycerides: 85 mg/dL (ref 0.0–149.0)
VLDL: 17 mg/dL (ref 0.0–40.0)

## 2022-06-04 LAB — VITAMIN D 25 HYDROXY (VIT D DEFICIENCY, FRACTURES): VITD: 30.12 ng/mL (ref 30.00–100.00)

## 2022-06-04 NOTE — Assessment & Plan Note (Signed)
Chronic, stable. She is also following with cardiology for this as she has a significant history of heart disease in her family. Her recent calcium score was 0. She is taking rosuvastatin '20mg'$  daily. Will check CMP, CBC, and fasting lipid panel today.

## 2022-06-04 NOTE — Progress Notes (Signed)
BP 114/68   Pulse 77   Temp (!) 97.1 F (36.2 C) (Temporal)   Ht 5' (1.524 m)   Wt 122 lb 6.4 oz (55.5 kg)   SpO2 99%   BMI 23.90 kg/m    Subjective:    Patient ID: Patricia Hampton, female    DOB: 12-Sep-1950, 71 y.o.   MRN: [id number]  CC: Chief Complaint  Patient presents with   Annual Exam    CPE/labs. Fasting today.    HPI: Patricia Hampton is a 71 y.o. female presenting on 06/04/2022 for comprehensive medical examination. Current medical complaints include: left ear redness, swelling  She noted that her left ear lobe started swelling, turning red and getting warm. She went to urgent care and was diagnosed with cellulitis. She is currently taking doxycycline twice a day. Her swelling and redness have improved.   Depression Screen done today and results listed below:     06/04/2022    5:26 PM 09/12/2021   12:57 PM 09/12/2021   12:53 PM 03/14/2021    9:52 AM 08/29/2020    3:13 PM  Depression screen PHQ 2/9  Decreased Interest 0 0 0 0 0  Down, Depressed, Hopeless 0 0 0 0 0  PHQ - 2 Score 0 0 0 0 0    The patient does not have a history of falls. I did not complete a risk assessment for falls. A plan of care for falls was not documented.   Past Medical History:  Past Medical History:  Diagnosis Date   Allergy    Anxiety    Cataract Nov 19, 2020   I had cataract surgery in my right eye. I have cataracts in my left eye, but it is not ready for surgery.   GERD (gastroesophageal reflux disease)    Hyperlipidemia    Osteopenia 02/19/2005   Urinary incontinence     Surgical History:  Past Surgical History:  Procedure Laterality Date   bladder tack     BREAST EXCISIONAL BIOPSY Left    CESAREAN SECTION     COLONOSCOPY  08/2006   EYE SURGERY  Nov 23, 2021   Right eye cataract   REDUCTION MAMMAPLASTY Bilateral 05/13/2017   REPEAT CESAREAN SECTION      Medications:  Current Outpatient Medications on File Prior to Visit  Medication Sig   ALPRAZolam (XANAX)  0.25 MG tablet Take 1 tablet (0.25 mg total) by mouth at bedtime as needed for anxiety. No additional refill without office visit   aspirin EC 81 MG tablet Take 1 tablet (81 mg total) by mouth daily.   Biotin 1000 MCG tablet Take 1 tablet (1 mg total) by mouth daily.   Calcium Carbonate-Vitamin D 600-400 MG-UNIT tablet Take 1 tablet by mouth 2 (two) times daily.   doxycycline (MONODOX) 100 MG capsule Take 100 mg by mouth 2 (two) times daily.   EPINEPHrine (EPIPEN 2-PAK) 0.3 mg/0.3 mL IJ SOAJ injection Inject 0.3 mg into the muscle as needed for anaphylaxis.   gabapentin (NEURONTIN) 100 MG capsule Take 1 capsule (100 mg total) by mouth at bedtime.   meloxicam (MOBIC) 7.5 MG tablet Take 1 tablet (7.5 mg total) by mouth daily. With food   Omega-3 Fatty Acids (FISH OIL) 1000 MG CAPS Take 2qd   pantoprazole (PROTONIX) 40 MG tablet TAKE 1 TABLET BY MOUTH 2 TIMES DAILY BEFORE A MEAL.   rizatriptan (MAXALT) 5 MG tablet Take 1 tablet (5 mg total) by mouth as needed for migraine. May repeat  in 2 hours if needed. No more than '30mg'$  in 24hrs   rosuvastatin (CRESTOR) 20 MG tablet Take 1 tablet (20 mg total) by mouth daily.   spironolactone (ALDACTONE) 100 MG tablet TAKE 1 TABLET BY MOUTH EVERY DAY   tretinoin (RETIN-A) 0.025 % cream Apply topically at bedtime.   valACYclovir (VALTREX) 1000 MG tablet Take 1 tablet (1,000 mg total) by mouth 2 (two) times daily. For Episodic treatment   No current facility-administered medications on file prior to visit.    Allergies:  Allergies  Allergen Reactions   Bee Venom Hives    Social History:  Social History   Socioeconomic History   Marital status: Married    Spouse name: Not on file   Number of children: 2   Years of education: Not on file   Highest education level: Not on file  Occupational History    Employer: [employer]  Tobacco Use   Smoking status: Never   Smokeless tobacco: Never  Vaping Use   Vaping Use: Never used  Substance  and Sexual Activity   Alcohol use: Yes    Alcohol/week: 10.0 standard drinks of alcohol    Types: 5 Glasses of wine, 5 Cans of beer per week    Comment: 8-10 glasses wine per week per pt   Drug use: No   Sexual activity: Yes  Other Topics Concern   Not on file  Social History Narrative   Not on file   Social Determinants of Health   Financial Resource Strain: Low Risk  (09/12/2021)   Overall Financial Resource Strain (CARDIA)    Difficulty of Paying Living Expenses: Not hard at all  Food Insecurity: No Food Insecurity (09/12/2021)   Hunger Vital Sign    Worried About Running Out of Food in the Last Year: Never true    Ran Out of Food in the Last Year: Never true  Transportation Needs: No Transportation Needs (09/12/2021)   PRAPARE - Hydrologist (Medical): No    Lack of Transportation (Non-Medical): No  Physical Activity: Sufficiently Active (09/12/2021)   Exercise Vital Sign    Days of Exercise per Week: 5 days    Minutes of Exercise per Session: 60 min  Stress: No Stress Concern Present (09/12/2021)   [place]    Feeling of Stress : Not at all  Social Connections: [employer] (09/12/2021)   Social Connection and Isolation Panel [NHANES]    Frequency of Communication with Friends and Family: Three times a week    Frequency of Social Gatherings with Friends and Family: Three times a week    Attends Religious Services: More than 4 times per year    Active Member of Clubs or Organizations: Yes    Attends Archivist Meetings: More than 4 times per year    Marital Status: Married  Human resources officer Violence: Not At Risk (09/12/2021)   Humiliation, Afraid, Rape, and Kick questionnaire    Fear of Current or Ex-Partner: No    Emotionally Abused: No    Physically Abused: No    Sexually Abused: No   Social History   Tobacco Use  Smoking Status Never  Smokeless Tobacco  Never   Social History   Substance and Sexual Activity  Alcohol Use Yes   Alcohol/week: 10.0 standard drinks of alcohol   Types: 5 Glasses of wine, 5 Cans of beer per week   Comment: 8-10 glasses wine per week  per pt    Family History:  Family History  Problem Relation Age of Onset   Hyperlipidemia Mother    Heart attack Mother 78       Died of MI   Early death Mother    Stroke Father    Heart attack Father 52   Hypertension Sister    Depression Sister    Alcohol abuse Sister    Stroke Sister    Early death Sister    Obesity Sister    Hyperlipidemia Brother    Cancer Brother        ESOPHAGEAL   Esophageal cancer Brother    Early death Brother    Diabetes Brother    Hypertension Brother    Depression Brother    Hyperlipidemia Brother    Cancer Maternal Grandmother        breast   Breast cancer Maternal Grandmother    Diabetes Paternal Grandfather    Colon cancer Neg Hx    Colon polyps Neg Hx    Rectal cancer Neg Hx    Stomach cancer Neg Hx     Past medical history, surgical history, medications, allergies, family history and social history reviewed with patient today and changes made to appropriate areas of the chart.   Review of Systems  Constitutional: Negative.   HENT: Negative.    Eyes: Negative.   Respiratory: Negative.    Cardiovascular: Negative.   Gastrointestinal: Negative.   Genitourinary:  Positive for frequency. Negative for dysuria and urgency.  Musculoskeletal: Negative.   Skin: Negative.   Neurological: Negative.   Psychiatric/Behavioral:  Negative for depression. The patient is nervous/anxious.    All other ROS negative except what is listed above and in the HPI.      Objective:    BP 114/68   Pulse 77   Temp (!) 97.1 F (36.2 C) (Temporal)   Ht 5' (1.524 m)   Wt 122 lb 6.4 oz (55.5 kg)   SpO2 99%   BMI 23.90 kg/m   Wt Readings from Last 3 Encounters:  06/04/22 122 lb 6.4 oz (55.5 kg)  04/11/22 122 lb (55.3 kg)  09/24/21 125  lb 3.2 oz (56.8 kg)    Physical Exam Vitals and nursing note reviewed.  Constitutional:      General: She is not in acute distress.    Appearance: Normal appearance.  HENT:     Head: Normocephalic and atraumatic.     Right Ear: Tympanic membrane, ear canal and external ear normal.     Left Ear: Tympanic membrane, ear canal and external ear normal.  Eyes:     Conjunctiva/sclera: Conjunctivae normal.  Cardiovascular:     Rate and Rhythm: Normal rate and regular rhythm.     Pulses: Normal pulses.     Heart sounds: Normal heart sounds.  Pulmonary:     Effort: Pulmonary effort is normal.     Breath sounds: Normal breath sounds.  Abdominal:     Palpations: Abdomen is soft.     Tenderness: There is no abdominal tenderness.  Musculoskeletal:        General: Normal range of motion.     Cervical back: Normal range of motion and neck supple.     Right lower leg: No edema.     Left lower leg: No edema.  Lymphadenopathy:     Cervical: No cervical adenopathy.  Skin:    General: Skin is warm and dry.  Neurological:     General: No focal deficit present.  Mental Status: She is alert and oriented to person, place, and time.     Cranial Nerves: No cranial nerve deficit.     Coordination: Coordination normal.     Gait: Gait normal.  Psychiatric:        Mood and Affect: Mood normal.        Behavior: Behavior normal.        Thought Content: Thought content normal.        Judgment: Judgment normal.     Results for orders placed or performed in visit on 06/04/22  CBC with Differential/Platelet  Result Value Ref Range   WBC 5.2 4.0 - 10.5 K/uL   RBC 4.19 3.87 - 5.11 Mil/uL   Hemoglobin 13.5 12.0 - 15.0 g/dL   HCT 40.2 36.0 - 46.0 %   MCV 95.9 78.0 - 100.0 fl   MCHC 33.7 30.0 - 36.0 g/dL   RDW 14.5 11.5 - 15.5 %   Platelets 276.0 150.0 - 400.0 K/uL   Neutrophils Relative % 54.6 43.0 - 77.0 %   Lymphocytes Relative 29.6 12.0 - 46.0 %   Monocytes Relative 11.6 3.0 - 12.0 %    Eosinophils Relative 3.7 0.0 - 5.0 %   Basophils Relative 0.5 0.0 - 3.0 %   Neutro Abs 2.8 1.4 - 7.7 K/uL   Lymphs Abs 1.5 0.7 - 4.0 K/uL   Monocytes Absolute 0.6 0.1 - 1.0 K/uL   Eosinophils Absolute 0.2 0.0 - 0.7 K/uL   Basophils Absolute 0.0 0.0 - 0.1 K/uL  Comprehensive metabolic panel  Result Value Ref Range   Sodium 136 135 - 145 mEq/L   Potassium 3.9 3.5 - 5.1 mEq/L   Chloride 100 96 - 112 mEq/L   CO2 30 19 - 32 mEq/L   Glucose, Bld 97 70 - 99 mg/dL   BUN 13 6 - 23 mg/dL   Creatinine, Ser 0.66 0.40 - 1.20 mg/dL   Total Bilirubin 0.5 0.2 - 1.2 mg/dL   Alkaline Phosphatase 54 39 - 117 U/L   AST 20 0 - 37 U/L   ALT 25 0 - 35 U/L   Total Protein 6.4 6.0 - 8.3 g/dL   Albumin 4.4 3.5 - 5.2 g/dL   GFR 89.72 >60.00 mL/min   Calcium 9.2 8.4 - 10.5 mg/dL  Lipid panel  Result Value Ref Range   Cholesterol 202 (H) 0 - 200 mg/dL   Triglycerides 85.0 0.0 - 149.0 mg/dL   HDL 93.70 >39.00 mg/dL   VLDL 17.0 0.0 - 40.0 mg/dL   LDL Cholesterol 91 0 - 99 mg/dL   Total CHOL/HDL Ratio 2    NonHDL 108.43   VITAMIN D 25 Hydroxy (Vit-D Deficiency, Fractures)  Result Value Ref Range   VITD 30.12 30.00 - 100.00 ng/mL      Assessment & Plan:   Problem List Items Addressed This Visit       Digestive   GERD (gastroesophageal reflux disease)    Chronic, stable. Well controlled on pantoprazole '40mg'$  daily. Continue current regimen.         Musculoskeletal and Integument   Osteopenia    Most recent T score was -1.9 on 12/27/20. Recommend ongoing exercise and taking calcium/vitamin D supplement. Check vitamin D levels today.       Relevant Orders   VITAMIN D 25 Hydroxy (Vit-D Deficiency, Fractures) (Completed)     Other   HLD (hyperlipidemia)    Chronic, stable. She is also following with cardiology for this as she has a significant  history of heart disease in her family. Her recent calcium score was 0. She is taking rosuvastatin '20mg'$  daily. Will check CMP, CBC, and fasting lipid panel  today.       Relevant Orders   CBC with Differential/Platelet (Completed)   Comprehensive metabolic panel (Completed)   Lipid panel (Completed)   Other Visit Diagnoses     Routine general medical examination at a health care facility    -  Primary   Health maintenance reviewed and updated. Discussed nutrition, exercise. Check CMP, CBC today. Follow-up in 1 year        Follow up plan: Return in about 1 year (around 06/05/2023) for CPE.   LABORATORY TESTING:  - Pap smear: not applicable  IMMUNIZATIONS:   - Tdap: Tetanus vaccination status reviewed: last tetanus booster within 10 years. - Influenza: Up to date - Pneumovax: Up to date - Prevnar: Up to date - HPV: Not applicable - Zostavax vaccine: Up to date  SCREENING: -Mammogram:  scheduled 06/2022   - Colonoscopy: Up to date  - Bone Density: Up to date  -Hearing Test: Not applicable  -Spirometry: Not applicable   PATIENT COUNSELING:   Advised to take 1 mg of folate supplement per day if capable of pregnancy.   Sexuality: Discussed sexually transmitted diseases, partner selection, use of condoms, avoidance of unintended pregnancy  and contraceptive alternatives.   Advised to avoid cigarette smoking.  I discussed with the patient that most people either abstain from alcohol or drink within safe limits (<=14/week and <=4 drinks/occasion for males, <=7/weeks and <= 3 drinks/occasion for females) and that the risk for alcohol disorders and other health effects rises proportionally with the number of drinks per week and how often a drinker exceeds daily limits.  Discussed cessation/primary prevention of drug use and availability of treatment for abuse.   Diet: Encouraged to adjust caloric intake to maintain  or achieve ideal body weight, to reduce intake of dietary saturated fat and total fat, to limit sodium intake by avoiding high sodium foods and not adding table salt, and to maintain adequate dietary potassium and  calcium preferably from fresh fruits, vegetables, and low-fat dairy products.    stressed the importance of regular exercise  Injury prevention: Discussed safety belts, safety helmets, smoke detector, smoking near bedding or upholstery.   Dental health: Discussed importance of regular tooth brushing, flossing, and dental visits.    NEXT PREVENTATIVE PHYSICAL DUE IN 1 YEAR. Return in about 1 year (around 06/05/2023) for CPE.

## 2022-06-04 NOTE — Assessment & Plan Note (Signed)
Chronic, stable. Well controlled on pantoprazole '40mg'$  daily. Continue current regimen.

## 2022-06-04 NOTE — Assessment & Plan Note (Signed)
Most recent T score was -1.9 on 12/27/20. Recommend ongoing exercise and taking calcium/vitamin D supplement. Check vitamin D levels today.

## 2022-06-04 NOTE — Patient Instructions (Signed)
It was great to see you!  We are checking your labs today and will let you know the results via mychart/phone.   Keep up the great work!   Let's follow-up in 1 year, sooner if you have concerns.  If a referral was placed today, you will be contacted for an appointment. Please note that routine referrals can sometimes take up to 3-4 weeks to process. Please call our office if you haven't heard anything after this time frame.  Take care,  Vance Peper, NP

## 2022-06-21 ENCOUNTER — Encounter: Payer: Self-pay | Admitting: Neurology

## 2022-06-24 ENCOUNTER — Encounter: Payer: Self-pay | Admitting: Nurse Practitioner

## 2022-07-04 ENCOUNTER — Ambulatory Visit
Admission: RE | Admit: 2022-07-04 | Discharge: 2022-07-04 | Disposition: A | Discharge status: Home / Self Care | Payer: Medicare PPO | Source: Ambulatory Visit | Attending: Nurse Practitioner | Admitting: Nurse Practitioner

## 2022-07-04 DIAGNOSIS — Z1231 Encounter for screening mammogram for malignant neoplasm of breast: Secondary | ICD-10-CM | POA: Diagnosis not present

## 2022-07-08 ENCOUNTER — Other Ambulatory Visit: Payer: Self-pay

## 2022-07-08 ENCOUNTER — Encounter: Payer: Self-pay | Admitting: Nurse Practitioner

## 2022-07-08 DIAGNOSIS — Z8619 Personal history of other infectious and parasitic diseases: Secondary | ICD-10-CM

## 2022-07-08 MED ORDER — VALACYCLOVIR HCL 1 G PO TABS: 1000.0000 mg | ORAL_TABLET | Freq: Two times a day (BID) | ORAL | 0 refills | Status: DC

## 2022-07-19 ENCOUNTER — Encounter: Payer: Self-pay | Admitting: Nurse Practitioner

## 2022-07-26 ENCOUNTER — Other Ambulatory Visit: Payer: Self-pay | Admitting: Nurse Practitioner

## 2022-07-26 DIAGNOSIS — F411 Generalized anxiety disorder: Secondary | ICD-10-CM

## 2022-08-06 NOTE — Progress Notes (Signed)
NEUROLOGY CONSULTATION NOTE  Patricia Hampton MRN: [id number] DOB: August 16, 1950  Referring provider: Wilfred Lacy, NP Primary care provider: Wilfred Lacy, NP  Reason for consult:  headache  Assessment/Plan:   Tension type headache, intractable - may be cervicogenic component  Migraine prevention:  Start nortriptyline '10mg'$  at bedtime.  We can increase to '25mg'$  if needed. If she should have another episode, we can try a prednisone taper to try and abort it and shorten duration. Limit use of pain relievers to no more than 2 days out of week to prevent risk of rebound or medication-overuse headache. Keep headache diary Discontinue gabapentin and rizatriptan Follow up 6 months.    Subjective:  Patricia Hampton is a 72 year old female with HLD, GERD, cataract and anxiety who presents for headache.  History supplemented by referring provider's note.   She began having episodes of headache around 2021.  She will first develop a "poking" sensation beginning in the right arm.  Over the course of 4 days, it will spread up the arm to the upper back bilaterally and then up the back of neck and down the back.  She then develops a moderate bilateral occipital headache.  No associated nausea, vomiting, photophobia, phonophobia, visual disturbance.  No associated numbness or weakness of the arm.  Symptoms are persistent and typically last 2 to 3 weeks.  She thinks stress may be a trigger.  Wrapping a tight band around her head helps.  She denies any neck pain.  It is not severe but uncomfortable enough to affect concentration.  She has tried NSAIDs, Fioricet, rizatriptan, muscle relaxants, and gabapentin with little benefit.  She had three episodes in 2023 [facility], July and then September.  CT head on 04/15/2022 personally reviewed was unremarkable.  She had similar occipital headaches years ago (without the arm and back discomfort) which was triggered by stress.  Past NSAIDS/analgesics:   acetaminophen, ibuprofen, naproxen, Fioricet Past abortive triptans:  none Past abortive ergotamine:  none Past muscle relaxants:  cyclobenzaprine, tizanidine Past anti-emetic:  none Past antihypertensive medications:  none Past antidepressant medications:  venlafaxine, citalopram, bupropion Past anticonvulsant medications:  none Past anti-CGRP:  none Past vitamins/Herbal/Supplements:  none Past antihistamines/decongestants:  none Other past therapies:  none  Current NSAIDS/analgesics:  ASA '81mg'$  daily, meloxicam 7.'5mg'$  QD  Current triptans:  rizatriptan '5mg'$  Current ergotamine:  none Current anti-emetic:  none Current muscle relaxants:  none Current Antihypertensive medications:  spironolactone Current Antidepressant medications:  none Current Anticonvulsant medications:  gabapentin '100mg'$  QHS PRN  Current anti-CGRP:  none Current Vitamins/Herbal/Supplements: omega-3, Ca-D Current Antihistamines/Decongestants:  none Other therapy:  wrapping tight band around head. Birth control:  none Other medications:  alprazolam QHS PRN (anxiety)   PAST MEDICAL HISTORY: Past Medical History:  Diagnosis Date   Allergy    Anxiety    Cataract Nov 19, 2020   I had cataract surgery in my right eye. I have cataracts in my left eye, but it is not ready for surgery.   GERD (gastroesophageal reflux disease)    Hyperlipidemia    Osteopenia 02/19/2005   Urinary incontinence     PAST SURGICAL HISTORY: Past Surgical History:  Procedure Laterality Date   bladder tack     BREAST EXCISIONAL BIOPSY Left    CESAREAN SECTION     COLONOSCOPY  08/2006   EYE SURGERY  Nov 23, 2021   Right eye cataract   REDUCTION MAMMAPLASTY Bilateral 05/13/2017   REPEAT CESAREAN SECTION      MEDICATIONS:  Current Outpatient Medications on File Prior to Visit  Medication Sig Dispense Refill   ALPRAZolam (XANAX) 0.25 MG tablet Take 1 tablet (0.25 mg total) by mouth at bedtime as needed for anxiety. No additional refill  without office visit 30 tablet 2   aspirin EC 81 MG tablet Take 1 tablet (81 mg total) by mouth daily.     Biotin 1000 MCG tablet Take 1 tablet (1 mg total) by mouth daily. 90 tablet 3   Calcium Carbonate-Vitamin D 600-400 MG-UNIT tablet Take 1 tablet by mouth 2 (two) times daily. 180 tablet 3   doxycycline (MONODOX) 100 MG capsule Take 100 mg by mouth 2 (two) times daily.     EPINEPHrine (EPIPEN 2-PAK) 0.3 mg/0.3 mL IJ SOAJ injection Inject 0.3 mg into the muscle as needed for anaphylaxis. 1 each 1   gabapentin (NEURONTIN) 100 MG capsule Take 1 capsule (100 mg total) by mouth at bedtime. 30 capsule 5   meloxicam (MOBIC) 7.5 MG tablet Take 1 tablet (7.5 mg total) by mouth daily. With food 30 tablet 2   Omega-3 Fatty Acids (FISH OIL) 1000 MG CAPS Take 2qd 180 capsule 3   pantoprazole (PROTONIX) 40 MG tablet TAKE 1 TABLET BY MOUTH 2 TIMES DAILY BEFORE A MEAL. 180 tablet 1   rizatriptan (MAXALT) 5 MG tablet Take 1 tablet (5 mg total) by mouth as needed for migraine. May repeat in 2 hours if needed. No more than '30mg'$  in 24hrs 10 tablet 0   rosuvastatin (CRESTOR) 20 MG tablet Take 1 tablet (20 mg total) by mouth daily. 90 tablet 3   spironolactone (ALDACTONE) 100 MG tablet TAKE 1 TABLET BY MOUTH EVERY DAY 90 tablet 1   tretinoin (RETIN-A) 0.025 % cream Apply topically at bedtime. 45 g 11   valACYclovir (VALTREX) 1000 MG tablet Take 1 tablet (1,000 mg total) by mouth 2 (two) times daily. For Episodic treatment 10 tablet 0   No current facility-administered medications on file prior to visit.    ALLERGIES: Allergies  Allergen Reactions   Bee Venom Hives    FAMILY HISTORY: Family History  Problem Relation Age of Onset   Hyperlipidemia Mother    Heart attack Mother 17       Died of MI   Early death Mother    Stroke Father    Heart attack Father 100   Hypertension Sister    Depression Sister    Alcohol abuse Sister    Stroke Sister    Early death Sister    Obesity Sister     Hyperlipidemia Brother    Cancer Brother        ESOPHAGEAL   Esophageal cancer Brother    Early death Brother    Diabetes Brother    Hypertension Brother    Depression Brother    Hyperlipidemia Brother    Cancer Maternal Grandmother        breast   Breast cancer Maternal Grandmother    Diabetes Paternal Grandfather    Colon cancer Neg Hx    Colon polyps Neg Hx    Rectal cancer Neg Hx    Stomach cancer Neg Hx     Objective:  Blood pressure (!) 151/64, pulse 80, height 5' (1.524 m), weight 122 lb 3.2 oz (55.4 kg), SpO2 91 %. General: No acute distress.  Patient appears well-groomed.   Head:  Normocephalic/atraumatic Eyes:  fundi examined but not visualized Neck: supple, no paraspinal tenderness, full range of motion Back: No paraspinal tenderness Heart: regular rate  and rhythm Lungs: Clear to auscultation bilaterally. Vascular: No carotid bruits. Neurological Exam: Mental status: alert and oriented to person, place, and time, speech fluent and not dysarthric, language intact. Cranial nerves: CN I: not tested CN II: pupils equal, round and reactive to light, visual fields intact CN III, IV, VI:  full range of motion, no nystagmus, no ptosis CN V: facial sensation intact. CN VII: upper and lower face symmetric CN VIII: hearing intact CN IX, X: gag intact, uvula midline CN XI: sternocleidomastoid and trapezius muscles intact CN XII: tongue midline Bulk & Tone: normal, no fasciculations. Motor:  muscle strength 5/5 throughout Sensation:  Temperature and vibratory sensation intact. Deep Tendon Reflexes:  2+ throughout,  toes downgoing.   Finger to nose testing:  Without dysmetria.   Heel to shin:  Without dysmetria.   Gait:  Normal station and stride.  Romberg negative.    Thank you for allowing me to take part in the care of this patient.  Metta Clines, DO  CC:  Vance Peper, NP  Flossie Buffy, NP

## 2022-08-07 ENCOUNTER — Encounter: Payer: Self-pay | Admitting: Neurology

## 2022-08-07 ENCOUNTER — Ambulatory Visit (INDEPENDENT_AMBULATORY_CARE_PROVIDER_SITE_OTHER): Payer: Medicare PPO | Admitting: Neurology

## 2022-08-07 VITALS — BP 151/64 | HR 80 | Ht 60.0 in | Wt 122.2 lb

## 2022-08-07 DIAGNOSIS — G44211 Episodic tension-type headache, intractable: Secondary | ICD-10-CM

## 2022-08-07 MED ORDER — NORTRIPTYLINE HCL 10 MG PO CAPS: 10.0000 mg | ORAL_CAPSULE | Freq: Every day | ORAL | 5 refills | Status: DC

## 2022-08-07 NOTE — Patient Instructions (Signed)
Start nortriptyline '10mg'$  at bedtime.  We can increase dose if needed If you have another episode, we can try a prednisone taper to try and break the duration of this Follow up in 6 months.

## 2022-08-12 ENCOUNTER — Encounter: Payer: Medicare PPO | Admitting: Nurse Practitioner

## 2022-08-28 ENCOUNTER — Ambulatory Visit: Payer: Medicare PPO | Admitting: Neurology

## 2022-09-02 ENCOUNTER — Other Ambulatory Visit: Payer: Self-pay | Admitting: Nurse Practitioner

## 2022-09-02 DIAGNOSIS — Z8619 Personal history of other infectious and parasitic diseases: Secondary | ICD-10-CM

## 2022-09-04 ENCOUNTER — Encounter: Payer: Self-pay | Admitting: Nurse Practitioner

## 2022-09-05 NOTE — Telephone Encounter (Signed)
Pt schedule for OV with PCP to discuss concerns.

## 2022-09-11 ENCOUNTER — Encounter: Payer: Self-pay | Admitting: Nurse Practitioner

## 2022-09-11 ENCOUNTER — Ambulatory Visit (INDEPENDENT_AMBULATORY_CARE_PROVIDER_SITE_OTHER): Payer: Medicare PPO | Admitting: Nurse Practitioner

## 2022-09-11 VITALS — BP 138/96 | HR 72 | Temp 98.7°F | Ht 60.0 in | Wt 121.8 lb

## 2022-09-11 DIAGNOSIS — E559 Vitamin D deficiency, unspecified: Secondary | ICD-10-CM | POA: Diagnosis not present

## 2022-09-11 DIAGNOSIS — R233 Spontaneous ecchymoses: Secondary | ICD-10-CM | POA: Diagnosis not present

## 2022-09-11 DIAGNOSIS — K219 Gastro-esophageal reflux disease without esophagitis: Secondary | ICD-10-CM | POA: Diagnosis not present

## 2022-09-11 DIAGNOSIS — T63481A Toxic effect of venom of other arthropod, accidental (unintentional), initial encounter: Secondary | ICD-10-CM

## 2022-09-11 DIAGNOSIS — E782 Mixed hyperlipidemia: Secondary | ICD-10-CM

## 2022-09-11 LAB — LIPID PANEL
Cholesterol: 216 mg/dL — ABNORMAL HIGH (ref 0–200)
HDL: 113.6 mg/dL (ref >39.00)
LDL Cholesterol: 85 mg/dL (ref 0–99)
NonHDL: 101.9
Total CHOL/HDL Ratio: 2
Triglycerides: 86 mg/dL (ref 0.0–149.0)
VLDL: 17.2 mg/dL (ref 0.0–40.0)

## 2022-09-11 LAB — CBC WITH DIFFERENTIAL/PLATELET
Basophils Absolute: 0 10*3/uL (ref 0.0–0.1)
Basophils Relative: 0.6 % (ref 0.0–3.0)
Eosinophils Absolute: 0.2 10*3/uL (ref 0.0–0.7)
Eosinophils Relative: 4 % (ref 0.0–5.0)
HCT: 44.4 % (ref 36.0–46.0)
Hemoglobin: 15.1 g/dL — ABNORMAL HIGH (ref 12.0–15.0)
Lymphocytes Relative: 27.9 % (ref 12.0–46.0)
Lymphs Abs: 1.4 10*3/uL (ref 0.7–4.0)
MCHC: 33.9 g/dL (ref 30.0–36.0)
MCV: 98.3 fl (ref 78.0–100.0)
Monocytes Absolute: 0.5 10*3/uL (ref 0.1–1.0)
Monocytes Relative: 11.1 % (ref 3.0–12.0)
Neutro Abs: 2.8 10*3/uL (ref 1.4–7.7)
Neutrophils Relative %: 56.4 % (ref 43.0–77.0)
Platelets: 255 10*3/uL (ref 150.0–400.0)
RBC: 4.52 Mil/uL (ref 3.87–5.11)
RDW: 13.7 % (ref 11.5–15.5)
WBC: 4.9 10*3/uL (ref 4.0–10.5)

## 2022-09-11 LAB — COMPREHENSIVE METABOLIC PANEL
ALT: 39 U/L — ABNORMAL HIGH (ref 0–35)
AST: 36 U/L (ref 0–37)
Albumin: 4.9 g/dL (ref 3.5–5.2)
Alkaline Phosphatase: 52 U/L (ref 39–117)
BUN: 14 mg/dL (ref 6–23)
CO2: 27 mEq/L (ref 19–32)
Calcium: 9.8 mg/dL (ref 8.4–10.5)
Chloride: 101 mEq/L (ref 96–112)
Creatinine, Ser: 0.73 mg/dL (ref 0.40–1.20)
GFR: 83.96 mL/min (ref >60.00)
Glucose, Bld: 95 mg/dL (ref 70–99)
Potassium: 4.4 mEq/L (ref 3.5–5.1)
Sodium: 140 mEq/L (ref 135–145)
Total Bilirubin: 0.6 mg/dL (ref 0.2–1.2)
Total Protein: 7.1 g/dL (ref 6.0–8.3)

## 2022-09-11 LAB — VITAMIN D 25 HYDROXY (VIT D DEFICIENCY, FRACTURES): VITD: 32.02 ng/mL (ref 30.00–100.00)

## 2022-09-11 LAB — MAGNESIUM: Magnesium: 1.9 mg/dL (ref 1.5–2.5)

## 2022-09-11 MED ORDER — EPINEPHRINE 0.3 MG/0.3ML IJ SOAJ: 0.3000 mg | INTRAMUSCULAR | 1 refills | Status: DC | PRN

## 2022-09-11 NOTE — Assessment & Plan Note (Signed)
She is currently taking a calcium-vitamin D supplement 2 tablets daily.  Will recheck vitamin D today.

## 2022-09-11 NOTE — Patient Instructions (Addendum)
It was great to see you!  We are checking your labs today and will let you know the results via mychart/phone.   Let's follow-up in 6 months, sooner if you have concerns.  If a referral was placed today, you will be contacted for an appointment. Please note that routine referrals can sometimes take up to 3-4 weeks to process. Please call our office if you haven't heard anything after this time frame.  Take care,  Vance Peper, NP Easy bruising

## 2022-09-11 NOTE — Progress Notes (Signed)
Acute Office Visit  Subjective:     Patient ID: Patricia Hampton, female    DOB: [birth year], 72 y.o.   MRN: [id number]  Chief Complaint  Patient presents with   Bleeding/Bruising    having bruising, request fasting labs    HPI Patient is in today for and she notes that a couple weeks ago she was washing dishes in the sink and scratched her hand and then noticed a bruise form I covered her entire hand.  The next morning she woke up but she also noted some bruising on her left hand.  She denies fevers.  She does take aspirin 81 mg daily.  She is also interested in having her cholesterol rechecked today.  She has been taking rosuvastatin 20 mg daily.  She denies chest pain and shortness of breath.  ROS See pertinent positives and negatives per HPI.    Objective:    BP (!) 138/96 (BP Location: Right Arm)   Pulse 72   Temp 98.7 F (37.1 C)   Ht 5' (1.524 m)   Wt 121 lb 12.8 oz (55.2 kg)   SpO2 99%   BMI 23.79 kg/m    Physical Exam Vitals and nursing note reviewed.  Constitutional:      General: She is not in acute distress.    Appearance: Normal appearance.  HENT:     Head: Normocephalic.  Eyes:     Conjunctiva/sclera: Conjunctivae normal.  Cardiovascular:     Rate and Rhythm: Normal rate and regular rhythm.     Pulses: Normal pulses.     Heart sounds: Normal heart sounds.  Pulmonary:     Effort: Pulmonary effort is normal.     Breath sounds: Normal breath sounds.  Musculoskeletal:     Cervical back: Normal range of motion.  Skin:    General: Skin is warm.     Findings: Bruising (fading bruise to right posterior hand) present.  Neurological:     General: No focal deficit present.     Mental Status: She is alert and oriented to person, place, and time.  Psychiatric:        Mood and Affect: Mood normal.        Behavior: Behavior normal.        Thought Content: Thought content normal.        Judgment: Judgment normal.      Assessment & Plan:   Problem  List Items Addressed This Visit       Digestive   GERD (gastroesophageal reflux disease)    Chronic, stable.  Continue pantoprazole 40 mg daily.  Will check magnesium levels today.  Follow-up in 6 months.      Relevant Orders   Magnesium     Other   HLD (hyperlipidemia)    Chronic, stable.  Continue rosuvastatin 20 mg daily.  Will check fasting lipid panel, CMP, CBC today.      Relevant Medications   EPINEPHrine (EPIPEN 2-PAK) 0.3 mg/0.3 mL IJ SOAJ injection   Other Relevant Orders   CBC with Differential/Platelet   Comprehensive metabolic panel   Lipid panel   Easy bruising - Primary    She has a fading bruise to her right posterior hand.  Reassured her that this is common with taking aspirin 81 mg daily and also thinning skin.  Will check CBC with differential today.  Follow-up as symptoms worsen or further concerns.      Relevant Orders   CBC with Differential/Platelet   Vitamin D insufficiency  She is currently taking a calcium-vitamin D supplement 2 tablets daily.  Will recheck vitamin D today.      Relevant Orders   VITAMIN D 25 Hydroxy (Vit-D Deficiency, Fractures)   Other Visit Diagnoses     Allergic reaction to insect sting, accidental or unintentional, initial encounter       Epi pen refill sent to pharmacy.   Relevant Medications   EPINEPHrine (EPIPEN 2-PAK) 0.3 mg/0.3 mL IJ SOAJ injection       Meds ordered this encounter  Medications   EPINEPHrine (EPIPEN 2-PAK) 0.3 mg/0.3 mL IJ SOAJ injection    Sig: Inject 0.3 mg into the muscle as needed for anaphylaxis.    Dispense:  2 each    Refill:  1    Return in about 6 months (around 03/12/2023) for HLD, Anxiety.  Charyl Dancer, NP

## 2022-09-11 NOTE — Assessment & Plan Note (Signed)
Chronic, stable.  Continue rosuvastatin 20 mg daily.  Will check fasting lipid panel, CMP, CBC today.

## 2022-09-11 NOTE — Assessment & Plan Note (Signed)
She has a fading bruise to her right posterior hand.  Reassured her that this is common with taking aspirin 81 mg daily and also thinning skin.  Will check CBC with differential today.  Follow-up as symptoms worsen or further concerns.

## 2022-09-11 NOTE — Assessment & Plan Note (Signed)
Chronic, stable.  Continue pantoprazole 40 mg daily.  Will check magnesium levels today.  Follow-up in 6 months.

## 2022-09-27 ENCOUNTER — Ambulatory Visit (INDEPENDENT_AMBULATORY_CARE_PROVIDER_SITE_OTHER): Payer: Medicare PPO

## 2022-09-27 VITALS — Ht 60.0 in | Wt 120.0 lb

## 2022-09-27 DIAGNOSIS — Z0000 Encounter for general adult medical examination without abnormal findings: Secondary | ICD-10-CM | POA: Diagnosis not present

## 2022-09-27 NOTE — Progress Notes (Signed)
I connected with  Patricia Hampton on 09/27/22 by a audio enabled telemedicine application and verified that I am speaking with the correct person using two identifiers.  Patient Location: Home  Provider Location: Office/Clinic  I discussed the limitations of evaluation and management by telemedicine. The patient expressed understanding and agreed to proceed.  Subjective:   Patricia Hampton is a 72 y.o. female who presents for Medicare Annual (Subsequent) preventive examination.  Patient Medicare AWV questionnaire was completed by the patient on 09/26/2022; I have confirmed that all information answered by patient is correct and no changes since this date.     Review of Systems     Cardiac Risk Factors include: advanced age (>68mn, >>88women);dyslipidemia     Objective:    Today's Vitals   09/27/22 1526  Weight: 120 lb (54.4 kg)  Height: 5' (1.524 m)   Body mass index is 23.44 kg/m.     09/27/2022    3:31 PM 09/12/2021   12:56 PM 03/14/2021    3:06 PM 10/16/2020    3:14 PM 08/29/2020    3:10 PM 08/18/2019    8:55 AM  Advanced Directives  Does Patient Have a Medical Advance Directive? Yes Yes Yes Yes Yes Yes  Type of AParamedicof AChirenoLiving will HJermynLiving will Living will;Healthcare Power of Attorney Living will;Healthcare Power of AHolmesLiving will H[place]pperopolisLiving will  Does patient want to make changes to medical advance directive?      No - Patient declined  Copy of H[place]urelin Chart? No - copy requested No - copy requested No - copy requested  No - copy requested No - copy requested    Current Medications (verified) Outpatient Encounter Medications as of 09/27/2022  Medication Sig   ALPRAZolam (XANAX) 0.25 MG tablet Take 1 tablet (0.25 mg total) by mouth at bedtime as needed for anxiety. No additional refill without office visit   aspirin EC  81 MG tablet Take 1 tablet (81 mg total) by mouth daily.   Biotin 1000 MCG tablet Take 1 tablet (1 mg total) by mouth daily.   Calcium Carbonate-Vitamin D 600-400 MG-UNIT tablet Take 1 tablet by mouth 2 (two) times daily.   EPINEPHrine (EPIPEN 2-PAK) 0.3 mg/0.3 mL IJ SOAJ injection Inject 0.3 mg into the muscle as needed for anaphylaxis.   Omega-3 Fatty Acids (FISH OIL) 1000 MG CAPS Take 2qd   pantoprazole (PROTONIX) 40 MG tablet TAKE 1 TABLET BY MOUTH 2 TIMES DAILY BEFORE A MEAL.   rosuvastatin (CRESTOR) 20 MG tablet Take 1 tablet (20 mg total) by mouth daily.   spironolactone (ALDACTONE) 100 MG tablet TAKE 1 TABLET BY MOUTH EVERY DAY   tretinoin (RETIN-A) 0.025 % cream Apply topically at bedtime.   valACYclovir (VALTREX) 1000 MG tablet TAKE 1 TABLET BY MOUTH 2 TIMES DAILY FOR EPISODIC TREATMENT   nortriptyline (PAMELOR) 10 MG capsule Take 1 capsule (10 mg total) by mouth at bedtime. (Patient not taking: Reported on 09/27/2022)   No facility-administered encounter medications on file as of 09/27/2022.    Allergies (verified) Bee venom   History: Past Medical History:  Diagnosis Date   Allergy    Anxiety    Cataract Nov 19, 2020   I had cataract surgery in my right eye. I have cataracts in my left eye, but it is not ready for surgery.   GERD (gastroesophageal reflux disease)    Hyperlipidemia  Osteopenia 02/19/2005   Urinary incontinence    Past Surgical History:  Procedure Laterality Date   bladder tack     BREAST EXCISIONAL BIOPSY Left    CESAREAN SECTION     COLONOSCOPY  08/2006   EYE SURGERY  Nov 23, 2021   Right eye cataract   REDUCTION MAMMAPLASTY Bilateral 05/13/2017   REPEAT CESAREAN SECTION     Family History  Problem Relation Age of Onset   Hyperlipidemia Mother    Heart attack Mother 12       Died of MI   Early death Mother    Stroke Father    Heart attack Father 15   Hypertension Sister    Depression Sister    Alcohol abuse Sister    Stroke Sister    Early  death Sister    Obesity Sister    Migraines Sister    Hyperlipidemia Brother    Cancer Brother        ESOPHAGEAL   Esophageal cancer Brother    Early death Brother    Diabetes Brother    Hypertension Brother    Depression Brother    Hyperlipidemia Brother    Cancer Maternal Grandmother        breast   Breast cancer Maternal Grandmother    Diabetes Paternal Grandfather    Colon cancer Neg Hx    Colon polyps Neg Hx    Rectal cancer Neg Hx    Stomach cancer Neg Hx    Social History   Socioeconomic History   Marital status: Married    Spouse name: Not on file   Number of children: 2   Years of education: Not on file   Highest education level: Not on file  Occupational History    Employer: [employer]  Tobacco Use   Smoking status: Never   Smokeless tobacco: Never  Vaping Use   Vaping Use: Never used  Substance and Sexual Activity   Alcohol use: Yes    Alcohol/week: 10.0 standard drinks of alcohol    Types: 5 Glasses of wine, 5 Cans of beer per week    Comment: 8-10 glasses wine per week per pt   Drug use: No   Sexual activity: Yes  Other Topics Concern   Not on file  Social History Narrative   Are you right handed or left handed? Right    Are you currently employed ? Retired   What is your current occupation?   Do you live at home alone? NO   Who lives with you? Husband   What type of home do you live in: 1 story or 2 story? 2       Social Determinants of Health   Financial Resource Strain: Low Risk  (09/26/2022)   Overall Financial Resource Strain (CARDIA)    Difficulty of Paying Living Expenses: Not hard at all  Food Insecurity: No Food Insecurity (09/27/2022)   Hunger Vital Sign    Worried About Running Out of Food in the Last Year: Never true    Ran Out of Food in the Last Year: Never true  Transportation Needs: No Transportation Needs (09/26/2022)   PRAPARE - Hydrologist (Medical): No    Lack of Transportation  (Non-Medical): No  Physical Activity: Sufficiently Active (09/26/2022)   Exercise Vital Sign    Days of Exercise per Week: 6 days    Minutes of Exercise per Session: 110 min  Stress: No Stress Concern Present (09/26/2022)  [place] Questionnaire    Feeling of Stress : Only a little  Social Connections: Unknown (09/26/2022)   Social Connection and Isolation Panel [NHANES]    Frequency of Communication with Friends and Family: More than three times a week    Frequency of Social Gatherings with Friends and Family: Once a week    Attends Religious Services: Not on Advertising copywriter or Organizations: Yes    Attends Music therapist: More than 4 times per year    Marital Status: Married    Tobacco Counseling Counseling given: Not Answered   Clinical Intake:  Pre-visit preparation completed: Yes  Pain : No/denies pain     Nutritional Status: BMI of 19-24  Normal Nutritional Risks: None Diabetes: No  How often do you need to have someone help you when you read instructions, pamphlets, or other written materials from your doctor or pharmacy?: 1 - Never  Diabetic? no  Interpreter Needed?: No  Information entered by :: NAllen LPN   Activities of Daily Living    09/27/2022    3:32 PM 09/26/2022    8:26 PM  In your present state of health, do you have any difficulty performing the following activities:  Hearing? 0 0  Vision? 0 0  Difficulty concentrating or making decisions? 0 0  Walking or climbing stairs? 0 0  Dressing or bathing? 0 0  Doing errands, shopping? 0 0  Preparing Food and eating ? N N  Using the Toilet? N N  In the past six months, have you accidently leaked urine? N N  Do you have problems with loss of bowel control? N N  Managing your Medications? N N  Managing your Finances? N N  Housekeeping or managing your Housekeeping? N N    Patient Care Team: Charyl Dancer, NP as PCP -  General (Internal Medicine) Minus Breeding, MD as PCP - Cardiology (Cardiology)  Indicate any recent Medical Services you may have received from other than [facility] providers in the past year (date may be approximate).     Assessment:   This is a routine wellness examination for [place].  Hearing/Vision screen Vision Screening - Comments:: Regular eye exams, [facility]  Dietary issues and exercise activities discussed: Current Exercise Habits: Home exercise routine, Type of exercise: walking, Time (Minutes): > 60, Frequency (Times/Week): 6, Weekly Exercise (Minutes/Week): 0   Goals Addressed             This Visit's Progress    Patient Stated       09/27/2022, wants to decrease alcohol intake       Depression Screen    09/27/2022    3:32 PM 09/11/2022    9:49 AM 06/04/2022    5:26 PM 09/12/2021   12:57 PM 09/12/2021   12:53 PM 03/14/2021    9:52 AM 08/29/2020    3:13 PM  PHQ 2/9 Scores  PHQ - 2 Score 0 0 0 0 0 0 0  PHQ- 9 Score  1         Fall Risk    09/26/2022    8:26 PM 09/11/2022    9:25 AM 09/12/2021   12:56 PM 08/29/2020    3:12 PM 08/18/2019    9:08 AM  [place] in the past year? 0 0 0 0 0  Number falls in past yr: 0 0 0 0 0  Injury with Fall? 0 0  0 0 0  Risk for fall due to : Medication side effect No Fall Risks No Fall Risks    Follow up Falls prevention discussed;Education provided;Falls evaluation completed Falls evaluation completed Falls evaluation completed Falls prevention discussed Education provided;Falls prevention discussed    FALL RISK PREVENTION PERTAINING TO THE HOME:  Any stairs in or around the home? Yes  If so, are there any without handrails? No  Home free of loose throw rugs in walkways, pet beds, electrical cords, etc? Yes  Adequate lighting in your home to reduce risk of falls? Yes   ASSISTIVE DEVICES UTILIZED TO PREVENT FALLS:  Life alert? No  Use of a cane, walker or w/c? No  Grab bars in the bathroom? No  Shower chair or  bench in shower? Yes  Elevated toilet seat or a handicapped toilet? No   TIMED UP AND GO:  Was the test performed? No .       Cognitive Function:        09/27/2022    3:35 PM  6CIT Screen  What Year? 0 points  What month? 0 points  What time? 0 points  Count back from 20 2 points  Months in reverse 0 points  Repeat phrase 0 points  Total Score 2 points    Immunizations Immunization History  Administered Date(s) Administered   Fluad Quad(high Dose 65+) 04/05/2019, 04/11/2022   Influenza,inj,Quad PF,6+ Mos 04/25/2015, 03/15/2017, 04/28/2018   Influenza-Unspecified 06/19/2020, 05/21/2021   PFIZER(Purple Top)SARS-COV-2 Vaccination 08/10/2019, 08/28/2019, 05/16/2020   Pneumococcal Conjugate-13 06/22/2018   Pneumococcal Polysaccharide-23 02/25/2020   Rabies, IM 02/22/2012, 02/25/2012, 02/29/2012, 03/07/2012   Rabies, intradermal 02/22/2012   Td 01/16/2005   Tdap 09/30/2016   Zoster Recombinat (Shingrix) 04/28/2018, 07/21/2018    TDAP status: Up to date  Flu Vaccine status: Up to date  Pneumococcal vaccine status: Up to date  Covid-19 vaccine status: Completed vaccines  Qualifies for Shingles Vaccine? Yes   Zostavax completed Yes   Shingrix Completed?: Yes  Screening Tests Health Maintenance  Topic Date Due   Medicare Annual Wellness (AWV)  09/12/2022   COVID-19 Vaccine (4 - 2023-24 season) 09/27/2022 (Originally 03/22/2022)   MAMMOGRAM  07/05/2023   COLONOSCOPY (Pts 45-72yr Insurance coverage will need to be confirmed)  02/10/2024   DTaP/Tdap/Td (3 - Td or Tdap) 10/01/2026   Pneumonia Vaccine 70 Years old  Completed   INFLUENZA VACCINE  Completed   DEXA SCAN  Completed   Hepatitis C Screening  Completed   Zoster Vaccines- Shingrix  Completed   HPV VACCINES  Aged Out    Health Maintenance  Health Maintenance Due  Topic Date Due   Medicare Annual Wellness (AWV)  09/12/2022    Colorectal cancer screening: Type of screening: Colonoscopy. Completed  02/10/2019. Repeat every 5 years  Mammogram status: Completed 07/04/2022. Repeat every year  Bone Density status: Completed 12/27/2020.   Lung Cancer Screening: (Low Dose CT Chest recommended if Age 70-80years, 30 pack-year currently smoking OR have quit w/in 15years.) does not qualify.   Lung Cancer Screening Referral: no  Additional Screening:  Hepatitis C Screening: does qualify; Completed 02/28/2016  Vision Screening: Recommended annual ophthalmology exams for early detection of glaucoma and other disorders of the eye. Is the patient up to date with their annual eye exam?  Yes  Who is the provider or what is the name of the office in which the patient attends annual [facility] exams[facility] If pt is not established with a provider, would they like  to be referred to a provider to establish care? No .   Dental Screening: Recommended annual dental exams for proper oral hygiene  Community Resource Referral / Chronic Care Management: CRR required this visit?  No   CCM required this visit?  No      Plan:     I have personally reviewed and noted the following in the patient's chart:   Medical and social history Use of alcohol, tobacco or illicit drugs  Current medications and supplements including opioid prescriptions. Patient is not currently taking opioid prescriptions. Functional ability and status Nutritional status Physical activity Advanced directives List of other physicians Hospitalizations, surgeries, and ER visits in previous 12 months Vitals Screenings to include cognitive, depression, and falls Referrals and appointments  In addition, I have reviewed and discussed with patient certain preventive protocols, quality metrics, and best practice recommendations. A written personalized care plan for preventive services as well as general preventive health recommendations were provided to patient.     Kellie Simmering, LPN   [id number]   Nurse Notes: none  Due to this  being a virtual visit, the after visit summary with patients personalized plan was offered to patient via mail or my-chart. Patient would like to access on my-chart

## 2022-09-27 NOTE — Patient Instructions (Signed)
[name] , Thank you for taking time to come for your Medicare Wellness Visit. I appreciate your ongoing commitment to your health goals. Please review the following plan we discussed and let me know if I can assist you in the future.   These are the goals we discussed:  Goals      Maintain healthy active lifestyle.     Patient Stated     Drink more water      Patient Stated     09/27/2022, wants to decrease alcohol intake        This is a list of the screening recommended for you and due dates:  Health Maintenance  Topic Date Due   COVID-19 Vaccine (4 - 2023-24 season) 09/27/2022*   Mammogram  07/05/2023   Medicare Annual Wellness Visit  09/27/2023   Colon Cancer Screening  02/10/2024   DTaP/Tdap/Td vaccine (3 - Td or Tdap) 10/01/2026   Pneumonia Vaccine  Completed   Flu Shot  Completed   DEXA scan (bone density measurement)  Completed   Hepatitis C Screening: USPSTF Recommendation to screen - Ages 30-79 yo.  Completed   Zoster (Shingles) Vaccine  Completed   HPV Vaccine  Aged Out  *Topic was postponed. The date shown is not the original due date.    Advanced directives: Please bring a copy of your POA (Power of Attorney) and/or Living Will to your next appointment.   Conditions/risks identified: none  Next appointment: Follow up in one year for your annual wellness visit    Preventive Care 65 Years and Older, Female Preventive care refers to lifestyle choices and visits with your health care provider that can promote health and wellness. What does preventive care include? A yearly physical exam. This is also called an annual well check. Dental exams once or twice a year. Routine eye exams. Ask your health care provider how often you should have your eyes checked. Personal lifestyle choices, including: Daily care of your teeth and gums. Regular physical activity. Eating a healthy diet. Avoiding tobacco and drug use. Limiting alcohol use. Practicing safe  sex. Taking low-dose aspirin every day. Taking vitamin and mineral supplements as recommended by your health care provider. What happens during an annual well check? The services and screenings done by your health care provider during your annual well check will depend on your age, overall health, lifestyle risk factors, and family history of disease. Counseling  Your health care provider may ask you questions about your: Alcohol use. Tobacco use. Drug use. Emotional well-being. Home and relationship well-being. Sexual activity. Eating habits. History of falls. Memory and ability to understand (cognition). Work and work Statistician. Reproductive health. Screening  You may have the following tests or measurements: Height, weight, and BMI. Blood pressure. Lipid and cholesterol levels. These may be checked every 5 years, or more frequently if you are over 35 years old. Skin check. Lung cancer screening. You may have this screening every year starting at age 32 if you have a 30-pack-year history of smoking and currently smoke or have quit within the past 15 years. Fecal occult blood test (FOBT) of the stool. You may have this test every year starting at age 76. Flexible sigmoidoscopy or colonoscopy. You may have a sigmoidoscopy every 5 years or a colonoscopy every 10 years starting at age 57. Hepatitis C blood test. Hepatitis B blood test. Sexually transmitted disease (STD) testing. Diabetes screening. This is done by checking your blood sugar (glucose) after you have not eaten for  a while (fasting). You may have this done every 1-3 years. Bone density scan. This is done to screen for osteoporosis. You may have this done starting at age 27. Mammogram. This may be done every 1-2 years. Talk to your health care provider about how often you should have regular mammograms. Talk with your health care provider about your test results, treatment options, and if necessary, the need for more  tests. Vaccines  Your health care provider may recommend certain vaccines, such as: Influenza vaccine. This is recommended every year. Tetanus, diphtheria, and acellular pertussis (Tdap, Td) vaccine. You may need a Td booster every 10 years. Zoster vaccine. You may need this after age 21. Pneumococcal 13-valent conjugate (PCV13) vaccine. One dose is recommended after age 61. Pneumococcal polysaccharide (PPSV23) vaccine. One dose is recommended after age 6. Talk to your health care provider about which screenings and vaccines you need and how often you need them. This information is not intended to replace advice given to you by your health care provider. Make sure you discuss any questions you have with your health care provider. Document Released: 08/04/2015 Document Revised: 03/27/2016 Document Reviewed: 05/09/2015 Elsevier Interactive Patient Education  2017 [employer] Prevention in the Home Falls can cause injuries. They can happen to people of all ages. There are many things you can do to make your home safe and to help prevent falls. What can I do on the outside of my home? Regularly fix the edges of walkways and driveways and fix any cracks. Remove anything that might make you trip as you walk through a door, such as a raised step or threshold. Trim any bushes or trees on the path to your home. Use bright outdoor lighting. Clear any walking paths of anything that might make someone trip, such as rocks or tools. Regularly check to see if handrails are loose or broken. Make sure that both sides of any steps have handrails. Any raised decks and porches should have guardrails on the edges. Have any leaves, snow, or ice cleared regularly. Use sand or salt on walking paths during winter. Clean up any spills in your garage right away. This includes oil or grease spills. What can I do in the bathroom? Use night lights. Install grab bars by the toilet and in the tub and shower.  Do not use towel bars as grab bars. Use non-skid mats or decals in the tub or shower. If you need to sit down in the shower, use a plastic, non-slip stool. Keep the floor dry. Clean up any water that spills on the floor as soon as it happens. Remove soap buildup in the tub or shower regularly. Attach bath mats securely with double-sided non-slip rug tape. Do not have throw rugs and other things on the floor that can make you trip. What can I do in the bedroom? Use night lights. Make sure that you have a light by your bed that is easy to reach. Do not use any sheets or blankets that are too big for your bed. They should not hang down onto the floor. Have a firm chair that has side arms. You can use this for support while you get dressed. Do not have throw rugs and other things on the floor that can make you trip. What can I do in the kitchen? Clean up any spills right away. Avoid walking on wet floors. Keep items that you use a lot in easy-to-reach places. If you need to reach something above  you, use a strong step stool that has a grab bar. Keep electrical cords out of the way. Do not use floor polish or wax that makes floors slippery. If you must use wax, use non-skid floor wax. Do not have throw rugs and other things on the floor that can make you trip. What can I do with my stairs? Do not leave any items on the stairs. Make sure that there are handrails on both sides of the stairs and use them. Fix handrails that are broken or loose. Make sure that handrails are as long as the stairways. Check any carpeting to make sure that it is firmly attached to the stairs. Fix any carpet that is loose or worn. Avoid having throw rugs at the top or bottom of the stairs. If you do have throw rugs, attach them to the floor with carpet tape. Make sure that you have a light switch at the top of the stairs and the bottom of the stairs. If you do not have them, ask someone to add them for you. What else  can I do to help prevent falls? Wear shoes that: Do not have high heels. Have rubber bottoms. Are comfortable and fit you well. Are closed at the toe. Do not wear sandals. If you use a stepladder: Make sure that it is fully opened. Do not climb a closed stepladder. Make sure that both sides of the stepladder are locked into place. Ask someone to hold it for you, if possible. Clearly mark and make sure that you can see: Any grab bars or handrails. First and last steps. Where the edge of each step is. Use tools that help you move around (mobility aids) if they are needed. These include: Canes. Walkers. Scooters. Crutches. Turn on the lights when you go into a dark area. Replace any light bulbs as soon as they burn out. Set up your furniture so you have a clear path. Avoid moving your furniture around. If any of your floors are uneven, fix them. If there are any pets around you, be aware of where they are. Review your medicines with your doctor. Some medicines can make you feel dizzy. This can increase your chance of falling. Ask your doctor what other things that you can do to help prevent falls. This information is not intended to replace advice given to you by your health care provider. Make sure you discuss any questions you have with your health care provider. Document Released: 05/04/2009 Document Revised: 12/14/2015 Document Reviewed: 08/12/2014 Elsevier Interactive Patient Education  2017 [employer].

## 2022-10-01 DIAGNOSIS — L719 Rosacea, unspecified: Secondary | ICD-10-CM | POA: Diagnosis not present

## 2022-10-01 DIAGNOSIS — L578 Other skin changes due to chronic exposure to nonionizing radiation: Secondary | ICD-10-CM | POA: Diagnosis not present

## 2022-10-01 DIAGNOSIS — L814 Other melanin hyperpigmentation: Secondary | ICD-10-CM | POA: Diagnosis not present

## 2022-10-01 DIAGNOSIS — Z79899 Other long term (current) drug therapy: Secondary | ICD-10-CM | POA: Diagnosis not present

## 2022-10-01 DIAGNOSIS — D225 Melanocytic nevi of trunk: Secondary | ICD-10-CM | POA: Diagnosis not present

## 2022-10-01 DIAGNOSIS — L821 Other seborrheic keratosis: Secondary | ICD-10-CM | POA: Diagnosis not present

## 2022-10-01 DIAGNOSIS — L658 Other specified nonscarring hair loss: Secondary | ICD-10-CM | POA: Diagnosis not present

## 2022-10-17 ENCOUNTER — Other Ambulatory Visit: Payer: Self-pay | Admitting: Nurse Practitioner

## 2022-10-17 DIAGNOSIS — F411 Generalized anxiety disorder: Secondary | ICD-10-CM

## 2022-10-17 NOTE — Telephone Encounter (Signed)
Refill request for  Xanax 0.25 mg LR 05/15/22, #30, 2 rf LOV 06/04/22 FOV  none scheduled  Please review and advise.  Thanks. Dm/cma

## 2022-10-28 ENCOUNTER — Other Ambulatory Visit: Payer: Self-pay | Admitting: Nurse Practitioner

## 2022-10-28 DIAGNOSIS — Z8619 Personal history of other infectious and parasitic diseases: Secondary | ICD-10-CM

## 2022-12-18 DIAGNOSIS — H43813 Vitreous degeneration, bilateral: Secondary | ICD-10-CM | POA: Diagnosis not present

## 2023-01-06 ENCOUNTER — Other Ambulatory Visit: Payer: Self-pay | Admitting: Nurse Practitioner

## 2023-01-06 ENCOUNTER — Other Ambulatory Visit: Payer: Self-pay | Admitting: Cardiology

## 2023-01-06 DIAGNOSIS — K219 Gastro-esophageal reflux disease without esophagitis: Secondary | ICD-10-CM

## 2023-01-06 DIAGNOSIS — Z8619 Personal history of other infectious and parasitic diseases: Secondary | ICD-10-CM

## 2023-01-06 NOTE — Telephone Encounter (Signed)
Requesting: VALACYCLOVIR HCL 1 GRAM TAB 1 Tablet  Last Visit: 09/11/2022 Next Visit: Visit date not found Last Refill: 10/28/2022  Please Advise

## 2023-01-10 ENCOUNTER — Encounter: Payer: Self-pay | Admitting: Nurse Practitioner

## 2023-01-10 ENCOUNTER — Other Ambulatory Visit: Payer: Self-pay | Admitting: Family

## 2023-01-10 MED ORDER — ACYCLOVIR 5 % EX OINT: 1.0000 | TOPICAL_OINTMENT | CUTANEOUS | 0 refills | Status: DC

## 2023-01-31 ENCOUNTER — Encounter: Payer: Self-pay | Admitting: Nurse Practitioner

## 2023-02-04 ENCOUNTER — Ambulatory Visit: Payer: Medicare PPO | Admitting: Neurology

## 2023-02-12 ENCOUNTER — Encounter: Payer: Self-pay | Admitting: Nurse Practitioner

## 2023-02-12 MED ORDER — ROSUVASTATIN CALCIUM 20 MG PO TABS: 20.0000 mg | ORAL_TABLET | Freq: Every day | ORAL | 0 refills | Status: DC

## 2023-02-18 ENCOUNTER — Encounter: Payer: Self-pay | Admitting: Nurse Practitioner

## 2023-02-19 ENCOUNTER — Encounter (INDEPENDENT_AMBULATORY_CARE_PROVIDER_SITE_OTHER): Payer: Self-pay

## 2023-02-26 ENCOUNTER — Other Ambulatory Visit: Payer: Self-pay | Admitting: Nurse Practitioner

## 2023-02-26 DIAGNOSIS — Z8619 Personal history of other infectious and parasitic diseases: Secondary | ICD-10-CM

## 2023-02-26 DIAGNOSIS — F411 Generalized anxiety disorder: Secondary | ICD-10-CM

## 2023-02-27 MED ORDER — VALACYCLOVIR HCL 1 G PO TABS
1000.0000 mg | ORAL_TABLET | ORAL | 0 refills | Status: DC | PRN
Start: 2023-02-27 — End: 2023-06-23

## 2023-03-10 ENCOUNTER — Encounter: Payer: Self-pay | Admitting: Nurse Practitioner

## 2023-03-12 ENCOUNTER — Ambulatory Visit (INDEPENDENT_AMBULATORY_CARE_PROVIDER_SITE_OTHER): Payer: Medicare PPO | Admitting: Nurse Practitioner

## 2023-03-12 ENCOUNTER — Encounter: Payer: Self-pay | Admitting: Nurse Practitioner

## 2023-03-12 VITALS — BP 122/84 | HR 71 | Temp 97.0°F | Ht 60.0 in | Wt 122.8 lb

## 2023-03-12 DIAGNOSIS — E782 Mixed hyperlipidemia: Secondary | ICD-10-CM

## 2023-03-12 DIAGNOSIS — F5101 Primary insomnia: Secondary | ICD-10-CM | POA: Insufficient documentation

## 2023-03-12 DIAGNOSIS — M8580 Other specified disorders of bone density and structure, unspecified site: Secondary | ICD-10-CM | POA: Diagnosis not present

## 2023-03-12 DIAGNOSIS — E559 Vitamin D deficiency, unspecified: Secondary | ICD-10-CM

## 2023-03-12 DIAGNOSIS — Z0000 Encounter for general adult medical examination without abnormal findings: Secondary | ICD-10-CM | POA: Insufficient documentation

## 2023-03-12 DIAGNOSIS — K219 Gastro-esophageal reflux disease without esophagitis: Secondary | ICD-10-CM

## 2023-03-12 DIAGNOSIS — G44209 Tension-type headache, unspecified, not intractable: Secondary | ICD-10-CM | POA: Insufficient documentation

## 2023-03-12 DIAGNOSIS — F419 Anxiety disorder, unspecified: Secondary | ICD-10-CM | POA: Diagnosis not present

## 2023-03-12 LAB — LIPID PANEL
Cholesterol: 225 mg/dL — ABNORMAL HIGH (ref 0–200)
HDL: 86.8 mg/dL (ref >39.00)
LDL Cholesterol: 103 mg/dL — ABNORMAL HIGH (ref 0–99)
NonHDL: 137.76
Total CHOL/HDL Ratio: 3
Triglycerides: 174 mg/dL — ABNORMAL HIGH (ref 0.0–149.0)
VLDL: 34.8 mg/dL (ref 0.0–40.0)

## 2023-03-12 LAB — CBC WITH DIFFERENTIAL/PLATELET
Basophils Absolute: 0 10*3/uL (ref 0.0–0.1)
Basophils Relative: 0.5 % (ref 0.0–3.0)
Eosinophils Absolute: 0.2 10*3/uL (ref 0.0–0.7)
Eosinophils Relative: 3.5 % (ref 0.0–5.0)
HCT: 43.7 % (ref 36.0–46.0)
Hemoglobin: 14.6 g/dL (ref 12.0–15.0)
Lymphocytes Relative: 29.5 % (ref 12.0–46.0)
Lymphs Abs: 1.4 10*3/uL (ref 0.7–4.0)
MCHC: 33.5 g/dL (ref 30.0–36.0)
MCV: 100.5 fl — ABNORMAL HIGH (ref 78.0–100.0)
Monocytes Absolute: 0.4 10*3/uL (ref 0.1–1.0)
Monocytes Relative: 9.1 % (ref 3.0–12.0)
Neutro Abs: 2.8 10*3/uL (ref 1.4–7.7)
Neutrophils Relative %: 57.4 % (ref 43.0–77.0)
Platelets: 239 10*3/uL (ref 150.0–400.0)
RBC: 4.35 Mil/uL (ref 3.87–5.11)
RDW: 12.5 % (ref 11.5–15.5)
WBC: 4.9 10*3/uL (ref 4.0–10.5)

## 2023-03-12 LAB — COMPREHENSIVE METABOLIC PANEL
ALT: 45 U/L — ABNORMAL HIGH (ref 0–35)
AST: 34 U/L (ref 0–37)
Albumin: 4.4 g/dL (ref 3.5–5.2)
Alkaline Phosphatase: 55 U/L (ref 39–117)
BUN: 14 mg/dL (ref 6–23)
CO2: 28 meq/L (ref 19–32)
Calcium: 9.3 mg/dL (ref 8.4–10.5)
Chloride: 104 meq/L (ref 96–112)
Creatinine, Ser: 0.58 mg/dL (ref 0.40–1.20)
GFR: 92.06 mL/min (ref >60.00)
Glucose, Bld: 107 mg/dL — ABNORMAL HIGH (ref 70–99)
Potassium: 3.9 meq/L (ref 3.5–5.1)
Sodium: 140 meq/L (ref 135–145)
Total Bilirubin: 0.6 mg/dL (ref 0.2–1.2)
Total Protein: 6.4 g/dL (ref 6.0–8.3)

## 2023-03-12 MED ORDER — DOXEPIN HCL 3 MG PO TABS: 3.0000 mg | ORAL_TABLET | Freq: Every evening | ORAL | 1 refills | Status: DC | PRN

## 2023-03-12 NOTE — Assessment & Plan Note (Addendum)
Chronic (stable).  She is taking her Rosuvastatin 20 mg daily as prescribed. Will check lipid panel today.

## 2023-03-12 NOTE — Assessment & Plan Note (Signed)
Chronic (ongoing). GAD-7 score is 1 today however patient states she worries a lot. She states she has some family issues going on that has increased her anxiety. She also feels like her headaches may have came from her constant worrying. She continues to take her Xanax 0.25 mg as needed for anxiety which also helps her with sleeping at night.

## 2023-03-12 NOTE — Telephone Encounter (Signed)
Will discuss at appointment today.

## 2023-03-12 NOTE — Assessment & Plan Note (Addendum)
Chronic ( stable). Patient has a history of low vitamin D levels in the past. She denies any fatigue or tiredness at this time. Continues to take her calcium supplements daily. Will check Vitamin D 25 Hydroxy today.

## 2023-03-12 NOTE — Assessment & Plan Note (Addendum)
She states she is no longer having concerns with headaches at this time. She states the last headache she had was in January 2024. She feels  like her headaches are related to her worrying. She states  since she has not been experiencing headaches she does need to take the Nortriptyline 10 mg daily. She has currently stopped taking this medication. Will continue to follow up with her neurologist.

## 2023-03-12 NOTE — Assessment & Plan Note (Addendum)
Health Maintenance reviewed and updated. She states she will be scheduling her annual mammogram in November 2024. Discussed exercise and nutrition. Will check complete blood count and Comprehensive metabolic panel today.

## 2023-03-12 NOTE — Assessment & Plan Note (Addendum)
Patient shares that she is not having any GERD symptoms at this time. She states she is no longer experiencing the need to constantly clear her throat.  Patient feels she does not need to take Pantoprazole 40 mg daily.  She has stopped taking this medication 1 month ago.  Agreed with her stopping the medication.

## 2023-03-12 NOTE — Assessment & Plan Note (Addendum)
Patient states she is unable to sleep at night. She shares she tried melatonin however it was not effective. Prescribed her Doxepin HCL 3 mg tablet to take one tablet daily at bedtime.

## 2023-03-12 NOTE — Progress Notes (Signed)
BP 122/84 (BP Location: Left Arm)   Pulse 71   Temp (!) 97 F (36.1 C)   Ht 5' (1.524 m)   Wt 122 lb 12.8 oz (55.7 kg)   SpO2 99%   BMI 23.98 kg/m    Subjective:    Patient ID: Patricia Hampton, female    DOB: [birth year], 72 y.o.   MRN: [id number]  CC: Chief Complaint  Patient presents with   Medication Management    Discuss medications, sleep issues, fasting lab work, ears to be cleaned out    HPI: Patricia Hampton is a 72 y.o. female presenting on 03/12/2023 for comprehensive medical examination. Current medical complaints include: Daaiyah begins by stating she has stopped taking her pantoprazole 40 mg about 1 month ago. She was concerned if that was ok. She did not feel it was helpful and she had been taking it for a long time. She denies having any more issues with constantly clearing her throat, or GERD-like symptoms.  She also shares she was prescribed Nortriptyline 10 mg for headache management. She described her H/A being very bad at one time secondary to pain that started at the mid lateral bicep of her right arm radiating to the base of her neck.  She feels her headaches has improved. She states she felt her headaches were triggered more by stress and worrying. She denies having any headaches since January 2024. She reveals she has stopped taking the Nortriptyline. She shares she will be seeing her neurologist for a follow-up secondary to her soreness in her right lateral bicep that radiates to the base of her neck. She states she only notices this discomfort when she lays down. She takes tylenol to help with the pain. She states this pain started 1 year ago.  She denies any trauma to this extremity.  She has been dealing with the inability to sleep at night. She states when she lays down to rest she notices a soreness in the lateral part of her right bicep which occasionally now has started to radiate to her right wrist keeps her up. She states she only notices this  when she is laying down. She states this discomfort keeps her awake at night. She denies any injury to this arm. She states she takes her Xanax 0.25 mg as needed to help her with sleeping.     She currently lives with: husband  Menopausal Symptoms: no  Depression and Anxiety Screen done today and results listed below:     03/12/2023    9:57 AM 09/27/2022    3:32 PM 09/11/2022    9:49 AM 06/04/2022    5:26 PM 09/12/2021   12:57 PM  Depression screen PHQ 2/9  Decreased Interest 0 0 0 0 0  Down, Depressed, Hopeless 1 0 0 0 0  PHQ - 2 Score 1 0 0 0 0  Altered sleeping 3  1    Tired, decreased energy 0  0    Change in appetite 0  0    Feeling bad or failure about yourself  0  0    Trouble concentrating 0  0    Moving slowly or fidgety/restless 0  0    Suicidal thoughts 0  0    PHQ-9 Score 4  1    Difficult doing work/chores   Not difficult at all        03/12/2023    9:58 AM 09/11/2022    9:49 AM 03/14/2021    9:52 AM  GAD 7 : Generalized Anxiety Score  Nervous, Anxious, on Edge 1 1 0  Control/stop worrying 0 0 1  Worry too much - different things 0 0 1  Trouble relaxing 0 0 0  Restless 0 0 0  Easily annoyed or irritable 0 0 0  Afraid - awful might happen 0 0 0  Total GAD 7 Score 1 1 2   Anxiety Difficulty Not difficult at all Not difficult at all Not difficult at all    The patient does not have a history of falls. I did complete a risk assessment for falls. A plan of care for falls was not documented.   Past Medical History:  Past Medical History:  Diagnosis Date   Allergy    Anxiety    Cataract Nov 19, 2020   I had cataract surgery in my right eye. I have cataracts in my left eye, but it is not ready for surgery.   GERD (gastroesophageal reflux disease)    Hyperlipidemia    Osteopenia 02/19/2005   Urinary incontinence     Surgical History:  Past Surgical History:  Procedure Laterality Date   bladder tack     BREAST EXCISIONAL BIOPSY Left    CESAREAN SECTION      COLONOSCOPY  08/2006   EYE SURGERY  Nov 23, 2021   Right eye cataract   REDUCTION MAMMAPLASTY Bilateral 05/13/2017   REPEAT CESAREAN SECTION      Medications:  Current Outpatient Medications on File Prior to Visit  Medication Sig   acyclovir ointment (ZOVIRAX) 5 % Apply 1 Application topically every 3 (three) hours.   ALPRAZolam (XANAX) 0.25 MG tablet TAKE 1 TABLET BY MOUTH AT BEDTIME AS NEEDED FOR ANXIETY.   Biotin 1000 MCG tablet Take 1 tablet (1 mg total) by mouth daily.   Calcium Carbonate-Vitamin D 600-400 MG-UNIT tablet Take 1 tablet by mouth 2 (two) times daily.   EPINEPHrine (EPIPEN 2-PAK) 0.3 mg/0.3 mL IJ SOAJ injection Inject 0.3 mg into the muscle as needed for anaphylaxis.   Omega-3 Fatty Acids (FISH OIL) 1000 MG CAPS Take 2qd   rosuvastatin (CRESTOR) 20 MG tablet Take 1 tablet (20 mg total) by mouth daily. Need office visit for future refills.   spironolactone (ALDACTONE) 100 MG tablet TAKE 1 TABLET BY MOUTH EVERY DAY   tretinoin (RETIN-A) 0.025 % cream Apply topically at bedtime.   valACYclovir (VALTREX) 1000 MG tablet Take 1 tablet (1,000 mg total) by mouth as needed.   aspirin EC 81 MG tablet Take 1 tablet (81 mg total) by mouth daily. (Patient not taking: Reported on 03/12/2023)   No current facility-administered medications on file prior to visit.    Allergies:  Allergies  Allergen Reactions   Bee Venom Hives    Social History:  Social History   Socioeconomic History   Marital status: Married    Spouse name: Not on file   Number of children: 2   Years of education: Not on file   Highest education level: Bachelor's degree (e.g., BA, AB, BS)  Occupational History    Employer: [employer]  Tobacco Use   Smoking status: Never   Smokeless tobacco: Never  Vaping Use   Vaping status: Never Used  Substance and Sexual Activity   Alcohol use: Yes    Alcohol/week: 10.0 standard drinks of alcohol    Types: 5 Glasses of wine, 5 Cans of beer per week     Comment: 8-10 glasses wine per week per pt   Drug use:  No   Sexual activity: Yes  Other Topics Concern   Not on file  Social History Narrative   Are you right handed or left handed? Right    Are you currently employed ? Retired   What is your current occupation?   Do you live at home alone? NO   Who lives with you? Husband   What type of home do you live in: 1 story or 2 story? 2       Social Determinants of Health   Financial Resource Strain: Low Risk  (03/11/2023)   Overall Financial Resource Strain (CARDIA)    Difficulty of Paying Living Expenses: Not hard at all  Food Insecurity: No Food Insecurity (03/11/2023)   Hunger Vital Sign    Worried About Running Out of Food in the Last Year: Never true    Ran Out of Food in the Last Year: Never true  Transportation Needs: No Transportation Needs (03/11/2023)   PRAPARE - Administrator, Civil Service (Medical): No    Lack of Transportation (Non-Medical): No  Physical Activity: Sufficiently Active (03/11/2023)   Exercise Vital Sign    Days of Exercise per Week: 7 days    Minutes of Exercise per Session: 80 min  Stress: Stress Concern Present (03/11/2023)   [employer] of Occupational Health - Occupational Stress Questionnaire    Feeling of Stress : Rather much  Social Connections: Socially Integrated (03/11/2023)   Social Connection and Isolation Panel [NHANES]    Frequency of Communication with Friends and Family: More than three times a week    Frequency of Social Gatherings with Friends and Family: Twice a week    Attends Religious Services: More than 4 times per year    Active Member of [employer] or Organizations: Yes    Attends Engineer, structural: More than 4 times per year    Marital Status: Married  Catering manager Violence: Not At Risk (09/12/2021)   Humiliation, Afraid, Rape, and Kick questionnaire    Fear of Current or Ex-Partner: No    Emotionally Abused: No    Physically Abused: No    Sexually  Abused: No   Social History   Tobacco Use  Smoking Status Never  Smokeless Tobacco Never   Social History   Substance and Sexual Activity  Alcohol Use Yes   Alcohol/week: 10.0 standard drinks of alcohol   Types: 5 Glasses of wine, 5 Cans of beer per week   Comment: 8-10 glasses wine per week per pt    Family History:  Family History  Problem Relation Age of Onset   Hyperlipidemia Mother    Heart attack Mother 59       Died of MI   Early death Mother    Stroke Father    Heart attack Father 22   Hypertension Sister    Depression Sister    Alcohol abuse Sister    Stroke Sister    Early death Sister    Obesity Sister    Migraines Sister    Hyperlipidemia Brother    Cancer Brother        ESOPHAGEAL   Esophageal cancer Brother    Early death Brother    Diabetes Brother    Hypertension Brother    Depression Brother    Hyperlipidemia Brother    Cancer Maternal Grandmother        breast   Breast cancer Maternal Grandmother    Diabetes Paternal Grandfather    Colon cancer Neg Hx  Colon polyps Neg Hx    Rectal cancer Neg Hx    Stomach cancer Neg Hx     Past medical history, surgical history, medications, allergies, family history and social history reviewed with patient today and changes made to appropriate areas of the chart.   Review of Systems  Constitutional:  Negative for chills, fever and weight loss.  HENT:  Negative for congestion and sore throat.   Eyes:  Negative for pain and discharge.       Scheduled to have cataract surgery on right eye   Respiratory:  Negative for cough, shortness of breath and wheezing.   Cardiovascular:  Negative for chest pain and palpitations.  Gastrointestinal:  Negative for constipation, diarrhea, heartburn, nausea and vomiting.  Genitourinary:  Negative for dysuria and urgency.  Musculoskeletal:  Positive for joint pain.       Pain that is described as soreness in rt  lateral biceps that radiates to wrist when lying down.   Skin: Negative.   Neurological:  Negative for dizziness, tremors and headaches.  Endo/Heme/Allergies:  Does not bruise/bleed easily.  Psychiatric/Behavioral:  The patient is nervous/anxious and has insomnia.        States she worries a lot   All other ROS negative except what is listed above and in the HPI.      Objective:    BP 122/84 (BP Location: Left Arm)   Pulse 71   Temp (!) 97 F (36.1 C)   Ht 5' (1.524 m)   Wt 122 lb 12.8 oz (55.7 kg)   SpO2 99%   BMI 23.98 kg/m   Wt Readings from Last 3 Encounters:  03/12/23 122 lb 12.8 oz (55.7 kg)  09/27/22 120 lb (54.4 kg)  09/11/22 121 lb 12.8 oz (55.2 kg)    Physical Exam Constitutional:      General: She is not in acute distress.    Appearance: Normal appearance. She is not ill-appearing.  HENT:     Head: Normocephalic and atraumatic.     Right Ear: Tympanic membrane, ear canal and external ear normal.     Left Ear: Tympanic membrane, ear canal and external ear normal.     Nose: Nose normal.     Mouth/Throat:     Mouth: Mucous membranes are moist.     Pharynx: No oropharyngeal exudate or posterior oropharyngeal erythema.  Eyes:     General:        Right eye: No discharge.        Left eye: No discharge.     Extraocular Movements: Extraocular movements intact.  Cardiovascular:     Rate and Rhythm: Normal rate and regular rhythm.     Pulses: Normal pulses.     Heart sounds: Normal heart sounds. No murmur heard.    No friction rub. No gallop.  Pulmonary:     Effort: Pulmonary effort is normal. No respiratory distress.     Breath sounds: Normal breath sounds. No wheezing or rales.  Abdominal:     General: Abdomen is flat. Bowel sounds are normal. There is no distension.     Palpations: Abdomen is soft. There is no mass.     Tenderness: There is no abdominal tenderness.  Musculoskeletal:        General: Tenderness present.     Cervical back: Normal range of motion. No tenderness.     Comments: Mild sore sensation  when raising right arm.   Lymphadenopathy:     Cervical: No cervical adenopathy.  Skin:  General: Skin is warm and dry.     Findings: No erythema.  Neurological:     Mental Status: She is alert and oriented to person, place, and time.     Gait: Gait normal.  Psychiatric:        Mood and Affect: Mood normal.        Behavior: Behavior normal.        Thought Content: Thought content normal.        Judgment: Judgment normal.     Results for orders placed or performed in visit on 09/11/22  CBC with Differential/Platelet  Result Value Ref Range   WBC 4.9 4.0 - 10.5 K/uL   RBC 4.52 3.87 - 5.11 Mil/uL   Hemoglobin 15.1 (H) 12.0 - 15.0 g/dL   HCT 16.1 09.6 - 04.5 %   MCV 98.3 78.0 - 100.0 fl   MCHC 33.9 30.0 - 36.0 g/dL   RDW 40.9 81.1 - 91.4 %   Platelets 255.0 150.0 - 400.0 K/uL   Neutrophils Relative % 56.4 43.0 - 77.0 %   Lymphocytes Relative 27.9 12.0 - 46.0 %   Monocytes Relative 11.1 3.0 - 12.0 %   Eosinophils Relative 4.0 0.0 - 5.0 %   Basophils Relative 0.6 0.0 - 3.0 %   Neutro Abs 2.8 1.4 - 7.7 K/uL   Lymphs Abs 1.4 0.7 - 4.0 K/uL   Monocytes Absolute 0.5 0.1 - 1.0 K/uL   Eosinophils Absolute 0.2 0.0 - 0.7 K/uL   Basophils Absolute 0.0 0.0 - 0.1 K/uL  Comprehensive metabolic panel  Result Value Ref Range   Sodium 140 135 - 145 mEq/L   Potassium 4.4 3.5 - 5.1 mEq/L   Chloride 101 96 - 112 mEq/L   CO2 27 19 - 32 mEq/L   Glucose, Bld 95 70 - 99 mg/dL   BUN 14 6 - 23 mg/dL   Creatinine, Ser 7.82 0.40 - 1.20 mg/dL   Total Bilirubin 0.6 0.2 - 1.2 mg/dL   Alkaline Phosphatase 52 39 - 117 U/L   AST 36 0 - 37 U/L   ALT 39 (H) 0 - 35 U/L   Total Protein 7.1 6.0 - 8.3 g/dL   Albumin 4.9 3.5 - 5.2 g/dL   GFR 95.62 >13.08 mL/min   Calcium 9.8 8.4 - 10.5 mg/dL  Lipid panel  Result Value Ref Range   Cholesterol 216 (H) 0 - 200 mg/dL   Triglycerides 65.7 0.0 - 149.0 mg/dL   HDL 846.96 >29.52 mg/dL   VLDL 84.1 0.0 - 32.4 mg/dL   LDL Cholesterol 85 0 - 99 mg/dL   Total  CHOL/HDL Ratio 2    NonHDL 101.90   VITAMIN D 25 Hydroxy (Vit-D Deficiency, Fractures)  Result Value Ref Range   VITD 32.02 30.00 - 100.00 ng/mL  Magnesium  Result Value Ref Range   Magnesium 1.9 1.5 - 2.5 mg/dL      Assessment & Plan:   Problem List Items Addressed This Visit       Digestive   GERD (gastroesophageal reflux disease)    Patient shares that she is not having any GERD symptoms at this time. She states she is no longer experiencing the need to constantly clear her throat.  Patient feels she does not need to take Pantoprazole 40 mg daily.  She has stopped taking this medication 1 month ago.  Agreed with her stopping the medication.         Musculoskeletal and Integument   Osteopenia    Chronic (  stable). Dexa-scan in June 2022 revealed a T score of -1.9. Continue to take Calcium Carbonate -Vitamin D 600/400 mg daily. Will repeat Dexa-Scan in 3 years as recommended.          Other   HLD (hyperlipidemia)    Chronic (stable).  She is taking her Rosuvastatin 20 mg daily as prescribed. Will check lipid panel today.        Relevant Orders   CBC with Differential/Platelet   Comprehensive metabolic panel   Lipid panel   Anxiety    Chronic (ongoing). GAD-7 score is 1 today however patient states she worries a lot. She states she has some family issues going on that has increased her anxiety. She also feels like her headaches may have came from her constant worrying. She continues to take her Xanax 0.25 mg as needed for anxiety which also helps her with sleeping at night.       Vitamin D insufficiency    Chronic ( stable). Patient has a history of low vitamin D levels in the past. She denies any fatigue or tiredness at this time. Continues to take her calcium supplements daily. Will check Vitamin D 25 Hydroxy today.       Relevant Orders   VITAMIN D 25 Hydroxy (Vit-D Deficiency, Fractures)   Primary insomnia    Patient states she is unable to sleep at night. She shares  she tried melatonin however it was not effective. Prescribed her Doxepin HCL 3 mg tablet to take one tablet daily at bedtime.        Routine general medical examination at a health care facility - Primary    Health Maintenance reviewed and updated. She states she will be scheduling her annual mammogram in November 2024. Discussed exercise and nutrition. Will check complete blood count and Comprehensive metabolic panel today.        Acute non intractable tension-type headache    She states she is no longer having concerns with headaches at this time. She states the last headache she had was in January 2024. She feels  like her headaches are related to her worrying. She states  since she has not been experiencing headaches she does need to take the Nortriptyline 10 mg daily. She has currently stopped taking this medication. Will continue to follow up with her neurologist.         Follow up plan: Return in about 4 weeks (around 04/09/2023) for 4-6 weeks insomnia, can be virtual .   LABORATORY TESTING:  - Pap smear: not applicable  IMMUNIZATIONS:   - Tdap: Tetanus vaccination status reviewed: last tetanus booster within 10 years. - Influenza: No in stock - Pneumovax: Up to date - Prevnar: Up to date - HPV: Not applicable - Shingrix vaccine: Up to date  SCREENING: -Mammogram: Up to date  - Colonoscopy: Up to date  - Bone Density: Up to date   PATIENT COUNSELING:   Advised to take 1 mg of folate supplement per day if capable of pregnancy.   Sexuality: Discussed sexually transmitted diseases, partner selection, use of condoms, avoidance of unintended pregnancy  and contraceptive alternatives.   Advised to avoid cigarette smoking.  I discussed with the patient that most people either abstain from alcohol or drink within safe limits (<=14/week and <=4 drinks/occasion for males, <=7/weeks and <= 3 drinks/occasion for females) and that the risk for alcohol disorders and other health  effects rises proportionally with the number of drinks per week and how often a drinker exceeds  daily limits.  Discussed cessation/primary prevention of drug use and availability of treatment for abuse.   Diet: Encouraged to adjust caloric intake to maintain  or achieve ideal body weight, to reduce intake of dietary saturated fat and total fat, to limit sodium intake by avoiding high sodium foods and not adding table salt, and to maintain adequate dietary potassium and calcium preferably from fresh fruits, vegetables, and low-fat dairy products.    stressed the importance of regular exercise  Injury prevention: Discussed safety belts, safety helmets, smoke detector, smoking near bedding or upholstery.   Dental health: Discussed importance of regular tooth brushing, flossing, and dental visits.    NEXT PREVENTATIVE PHYSICAL DUE IN 1 YEAR. Return in about 4 weeks (around 04/09/2023) for 4-6 weeks insomnia, can be virtual .  [place] [place]

## 2023-03-12 NOTE — Patient Instructions (Addendum)
It was great to see you!  We are checking your labs today and will let you know the results via mychart/phone.   You can stop taking the protonix and nortiptyline   I recommend you start aspirin 81mg  daily.   Start doxepin 1 tablet at night as needed for sleep   You can try tylenol arthritis or voltaren gel to help with the pain in your arm. The pain is most likely coming from your neck. Try adjusting your pillows at night as well. You had an x-ray that showed arthritis at the C5-C6 level of your neck.   Let's follow-up in 4-6 weeks, sooner if you have concerns.  If a referral was placed today, you will be contacted for an appointment. Please note that routine referrals can sometimes take up to 3-4 weeks to process. Please call our office if you haven't heard anything after this time frame.  Take care,  Rodman Pickle, NP

## 2023-03-12 NOTE — Assessment & Plan Note (Addendum)
Chronic (stable). Dexa-scan in June 2022 revealed a T score of -1.9. Continue to take Calcium Carbonate -Vitamin D 600/400 mg daily. Will repeat Dexa-Scan in 3 years as recommended.

## 2023-03-13 ENCOUNTER — Encounter: Payer: Self-pay | Admitting: Nurse Practitioner

## 2023-03-14 ENCOUNTER — Encounter: Payer: Self-pay | Admitting: Cardiology

## 2023-03-14 LAB — VITAMIN D 25 HYDROXY (VIT D DEFICIENCY, FRACTURES): VITD: 54.3 ng/mL (ref 30.00–100.00)

## 2023-03-20 DIAGNOSIS — H2512 Age-related nuclear cataract, left eye: Secondary | ICD-10-CM | POA: Diagnosis not present

## 2023-03-20 DIAGNOSIS — H25012 Cortical age-related cataract, left eye: Secondary | ICD-10-CM | POA: Diagnosis not present

## 2023-03-20 DIAGNOSIS — Z961 Presence of intraocular lens: Secondary | ICD-10-CM | POA: Diagnosis not present

## 2023-03-20 DIAGNOSIS — H25042 Posterior subcapsular polar age-related cataract, left eye: Secondary | ICD-10-CM | POA: Diagnosis not present

## 2023-03-21 ENCOUNTER — Encounter: Payer: Self-pay | Admitting: Nurse Practitioner

## 2023-03-21 DIAGNOSIS — F411 Generalized anxiety disorder: Secondary | ICD-10-CM

## 2023-03-25 MED ORDER — ALPRAZOLAM 0.25 MG PO TABS
0.2500 mg | ORAL_TABLET | Freq: Every evening | ORAL | 2 refills | Status: DC | PRN
Start: 2023-03-25 — End: 2023-10-24

## 2023-03-25 NOTE — Addendum Note (Signed)
Addended by: Rodman Pickle A on: 03/25/2023 10:46 AM   Modules accepted: Orders

## 2023-04-01 ENCOUNTER — Encounter: Payer: Self-pay | Admitting: Student

## 2023-04-01 ENCOUNTER — Ambulatory Visit: Payer: Medicare PPO | Attending: Cardiology | Admitting: Student

## 2023-04-01 DIAGNOSIS — E785 Hyperlipidemia, unspecified: Secondary | ICD-10-CM | POA: Diagnosis not present

## 2023-04-01 DIAGNOSIS — L820 Inflamed seborrheic keratosis: Secondary | ICD-10-CM | POA: Diagnosis not present

## 2023-04-01 NOTE — Progress Notes (Signed)
Patient ID: [name]                 DOB: [birth year]                    MRN: [id number]      HPI: Patricia Hampton is a 72 y.o. female patient referred to lipid clinic by Dr.Hochrein. PMH is significant for HDL, GERD, family hx of premature CAD, Anxiety, family hx of stroke   Recent lipid lab done at PCP and her LDLc was slightly above goal. Patient was concern so patient was referred to lipid clinic  Patient presented today in good spirit. Reports she tolerates Crestor 20 mg well without any problem. She was unable to tolerate 40 mg dose and she had tried Lipitor and Pravastatin in the past which was d/c due to myalgia. We dicussed family history and LDLc goals. Considering family hx of premature ASCVD can consider LDLc goal <70 mg/dl. Patient's recent CAC score is 0. Her ALT level is slightly elevated - reports drinks 14-15 std drinks per week. Will reassess liver function again in 2 months once it normalize may consider adding ezetimibe to high intensity statin to lower LDLc.     Review Alcohol and carbohydrate intake -   Reviewed options for lowering LDL cholesterol, including ezetimibe, PCSK-9 inhibitors, bempedoic acid and inclisiran.  Discussed mechanisms of action, dosing, side effects and potential decreases in LDL cholesterol.  Also reviewed cost information and potential options for patient assistance.  Current Medications: Crestor 20 mg daily  Intolerances: Lipitor and Pravastatin  Risk Factors: family hx of premature ASCVD, 10 years ASCVD risk 9.7%  LDL goal: <70 mg/dl or lenient goal <409 mg/dl   Diet: low fat, low carbohydrate  Likes fish , does not eat lot of red meat  Eat out once a week   Exercise: walk - 4 miles per day and 3 times pre week gym classes   Family History:  Mom - MI age 55  Father- heart attack at age 49 and stroke in early 59's Brother - age 58 MI  Sister: stroke at age 51  Other siblings- high cholesterol Mom side aunts and uncles died  from hear attacks   Social History:  Alcohol: 1-2 glasses per night 14-15 std drinks per week    Labs: Lipid Panel     Component Value Date/Time   CHOL 225 (H) 03/12/2023 0959   CHOL 208 (H) 01/28/2022 1005   TRIG 174.0 (H) 03/12/2023 0959   HDL 86.80 03/12/2023 0959   HDL 91 01/28/2022 1005   CHOLHDL 3 03/12/2023 0959   VLDL 34.8 03/12/2023 0959   LDLCALC 103 (H) 03/12/2023 0959   LDLCALC 104 (H) 01/28/2022 1005   LDLDIRECT 134.6 08/24/2013 0953   LABVLDL 13 01/28/2022 1005    Past Medical History:  Diagnosis Date   Allergy    Anxiety    Cataract Nov 19, 2020   I had cataract surgery in my right eye. I have cataracts in my left eye, but it is not ready for surgery.   GERD (gastroesophageal reflux disease)    Hyperlipidemia    Osteopenia 02/19/2005   Urinary incontinence     Current Outpatient Medications on File Prior to Visit  Medication Sig Dispense Refill   acyclovir ointment (ZOVIRAX) 5 % Apply 1 Application topically every 3 (three) hours. 15 g 0   ALPRAZolam (XANAX) 0.25 MG tablet Take 1 tablet (0.25 mg total) by mouth at bedtime as  needed for anxiety. 30 tablet 2   aspirin EC 81 MG tablet Take 1 tablet (81 mg total) by mouth daily. (Patient not taking: Reported on 03/12/2023)     Biotin 1000 MCG tablet Take 1 tablet (1 mg total) by mouth daily. 90 tablet 3   Calcium Carbonate-Vitamin D 600-400 MG-UNIT tablet Take 1 tablet by mouth 2 (two) times daily. 180 tablet 3   EPINEPHrine (EPIPEN 2-PAK) 0.3 mg/0.3 mL IJ SOAJ injection Inject 0.3 mg into the muscle as needed for anaphylaxis. 2 each 1   Omega-3 Fatty Acids (FISH OIL) 1000 MG CAPS Take 2qd 180 capsule 3   rosuvastatin (CRESTOR) 20 MG tablet Take 1 tablet (20 mg total) by mouth daily. Need office visit for future refills. 90 tablet 0   spironolactone (ALDACTONE) 100 MG tablet TAKE 1 TABLET BY MOUTH EVERY DAY 90 tablet 1   tretinoin (RETIN-A) 0.025 % cream Apply topically at bedtime. 45 g 11   valACYclovir  (VALTREX) 1000 MG tablet Take 1 tablet (1,000 mg total) by mouth as needed. 10 tablet 0   No current facility-administered medications on file prior to visit.    Allergies  Allergen Reactions   Bee Venom Hives    Assessment/Plan:  1. Hyperlipidemia -  Problem  Hld (Hyperlipidemia)   Qualifier: Diagnosis of  By: Jillyn Hidden FNP, Mcarthur Rossetti Current Medications: Crestor 20 mg daily  Intolerances: Lipitor and Pravastatin  Risk Factors: family hx of premature ASCVD, 10 years ASCVD risk 9.7%  LDL goal: <70 mg/dl or lenient goal <063 mg/dl     HLD (hyperlipidemia) Assessment and Plan:  LDLc goal <100 mg/dl stricter goal <01 mg/dl considering family hx of premature ASCVD  Currently on Crestor 20 mg daily and tolerates it well  Discussed lifestyle interventions for lipid management - EtOH, Exercise and Diet  Discussed Zetia - MOA, S/E, coverage and primary prevention benefit when added on high intensity statins  ALT - 10 points above UNL so will repeat lab in 1 month if come back WNL will consider adding Zetia 10 mg to current statin  Patient to lower alcohol consumption- no more than 7 drinks per week    Thank you,  Carmela Hurt, Pharm.D [facility] A Division of [facility] [address], [place], [place] [address]  Phone: [phone]; Fax: [phone]

## 2023-04-01 NOTE — Assessment & Plan Note (Addendum)
Assessment and Plan:  LDLc goal <100 mg/dl stricter goal <40 mg/dl considering family hx of premature ASCVD  Currently on Crestor 20 mg daily and tolerates it well  Discussed lifestyle interventions for lipid management - EtOH, Exercise and Diet  Discussed Zetia - MOA, S/E, coverage and primary prevention benefit when added on high intensity statins  ALT - 10 points above UNL so will repeat lab in 1 month if come back WNL will consider adding Zetia 10 mg to current statin  Patient to lower alcohol consumption- no more than 7 drinks per week

## 2023-04-01 NOTE — Patient Instructions (Signed)
Your Results:             Your most recent labs Goal  Total Cholesterol 225 < 200  Triglycerides 174 < 150  HDL (happy/good cholesterol) 86.80 > 40  LDL (lousy/bad cholesterol 103 < 100 or <70    Medication changes: none  Continue taking Crestor 20 mg daily  Repeat lab on Oct 08,2024

## 2023-04-29 DIAGNOSIS — E785 Hyperlipidemia, unspecified: Secondary | ICD-10-CM | POA: Diagnosis not present

## 2023-04-30 LAB — COMPREHENSIVE METABOLIC PANEL
ALT: 40 [IU]/L — ABNORMAL HIGH (ref 0–32)
AST: 30 [IU]/L (ref 0–40)
Albumin: 4.7 g/dL (ref 3.9–4.9)
Alkaline Phosphatase: 63 [IU]/L (ref 44–121)
BUN/Creatinine Ratio: 16 (ref 12–28)
BUN: 9 mg/dL (ref 8–27)
Bilirubin Total: 0.6 mg/dL (ref 0.0–1.2)
CO2: 25 mmol/L (ref 20–29)
Calcium: 9.6 mg/dL (ref 8.7–10.3)
Chloride: 102 mmol/L (ref 96–106)
Creatinine, Ser: 0.56 mg/dL — ABNORMAL LOW (ref 0.57–1.00)
Globulin, Total: 1.9 g/dL (ref 1.5–4.5)
Glucose: 101 mg/dL — ABNORMAL HIGH (ref 70–99)
Potassium: 4.9 mmol/L (ref 3.5–5.2)
Sodium: 141 mmol/L (ref 134–144)
Total Protein: 6.6 g/dL (ref 6.0–8.5)
eGFR: 99 mL/min/{1.73_m2} (ref >59)

## 2023-05-01 ENCOUNTER — Other Ambulatory Visit (HOSPITAL_COMMUNITY): Payer: Self-pay

## 2023-05-01 ENCOUNTER — Telehealth: Payer: Self-pay | Admitting: Pharmacist

## 2023-05-06 ENCOUNTER — Telehealth: Payer: Self-pay | Admitting: Pharmacy Technician

## 2023-05-06 ENCOUNTER — Other Ambulatory Visit (HOSPITAL_COMMUNITY): Payer: Self-pay

## 2023-05-06 DIAGNOSIS — E785 Hyperlipidemia, unspecified: Secondary | ICD-10-CM

## 2023-05-06 NOTE — Telephone Encounter (Signed)
Pharmacy Patient Advocate Encounter   Received notification from Fax that prior authorization for repatha is required/requested.   Insurance verification completed.   The patient is insured through [facility] .   Per test claim: PA required; PA submitted to [facility] via Fax Key/confirmation #/EOC Faxed Status is pending

## 2023-05-06 NOTE — Telephone Encounter (Signed)
Pharmacy Patient Advocate Encounter  Received notification from [facility] that Prior Authorization for repatha has been DENIED.  Full denial letter will be uploaded to the media tab. See denial reason below.   PA #/Case ID/Reference #: [id number]

## 2023-05-08 NOTE — Addendum Note (Signed)
Addended by: Tylene Fantasia on: 05/08/2023 08:31 AM   Modules accepted: Orders

## 2023-05-08 NOTE — Telephone Encounter (Signed)
Spoke to patient about denial and discussed the PCSK9i approval criteria.  Patient does not meet any of the criteria  Patient will work on cutting down on alcohol and will repeat lipid lab and LFT on Jan 21,2025 will consider adding Zetia if LFT is WNL

## 2023-05-09 NOTE — Telephone Encounter (Signed)
See other encounter from 05/06/2023.

## 2023-05-14 DIAGNOSIS — H2512 Age-related nuclear cataract, left eye: Secondary | ICD-10-CM | POA: Diagnosis not present

## 2023-05-14 HISTORY — PX: CATARACT EXTRACTION: SUR2

## 2023-05-29 ENCOUNTER — Other Ambulatory Visit: Payer: Self-pay | Admitting: Nurse Practitioner

## 2023-05-29 ENCOUNTER — Encounter: Payer: Self-pay | Admitting: Nurse Practitioner

## 2023-05-29 MED ORDER — ROSUVASTATIN CALCIUM 20 MG PO TABS: 20.0000 mg | ORAL_TABLET | Freq: Every day | ORAL | 0 refills | Status: DC

## 2023-06-23 ENCOUNTER — Other Ambulatory Visit: Payer: Self-pay | Admitting: Nurse Practitioner

## 2023-06-23 ENCOUNTER — Encounter: Payer: Self-pay | Admitting: Nurse Practitioner

## 2023-06-23 DIAGNOSIS — Z8619 Personal history of other infectious and parasitic diseases: Secondary | ICD-10-CM

## 2023-06-23 MED ORDER — VALACYCLOVIR HCL 1 G PO TABS
1000.0000 mg | ORAL_TABLET | ORAL | 0 refills | Status: DC | PRN
Start: 2023-06-23 — End: 2023-07-24

## 2023-06-23 NOTE — Telephone Encounter (Signed)
Requesting: Valtrex 1000mg  Last Visit: 03/12/2023 Next Visit: Visit date not found Last Refill: 02/27/2023  Please Advise

## 2023-07-11 ENCOUNTER — Other Ambulatory Visit: Payer: Self-pay | Admitting: Nurse Practitioner

## 2023-07-11 DIAGNOSIS — Z1231 Encounter for screening mammogram for malignant neoplasm of breast: Secondary | ICD-10-CM

## 2023-07-22 ENCOUNTER — Encounter: Payer: Self-pay | Admitting: Nurse Practitioner

## 2023-07-22 ENCOUNTER — Other Ambulatory Visit: Payer: Self-pay | Admitting: Nurse Practitioner

## 2023-07-22 DIAGNOSIS — T63481A Toxic effect of venom of other arthropod, accidental (unintentional), initial encounter: Secondary | ICD-10-CM

## 2023-07-22 MED ORDER — EPINEPHRINE 0.3 MG/0.3ML IJ SOAJ: 0.3000 mg | INTRAMUSCULAR | 1 refills | Status: AC | PRN

## 2023-07-22 NOTE — Telephone Encounter (Signed)
 Patient's immunization record in [facility] updated.

## 2023-07-22 NOTE — Telephone Encounter (Signed)
 Requesting: Epi-Pen Last Visit: 03/12/2023 Next Visit: Visit date not found Last Refill: 09/11/2022  Please Advise

## 2023-07-24 ENCOUNTER — Other Ambulatory Visit: Payer: Self-pay | Admitting: Nurse Practitioner

## 2023-07-24 DIAGNOSIS — Z8619 Personal history of other infectious and parasitic diseases: Secondary | ICD-10-CM

## 2023-07-24 MED ORDER — VALACYCLOVIR HCL 1 G PO TABS
1000.0000 mg | ORAL_TABLET | ORAL | 0 refills | Status: DC | PRN
Start: 2023-07-24 — End: 2023-11-21

## 2023-07-24 NOTE — Telephone Encounter (Signed)
 Requesting: valACYclovir (VALTREX) 1000 MG tablet  Last Visit: 03/12/2023 Next Visit: Visit date not found Last Refill: 06/23/2023  Please Advise

## 2023-08-11 ENCOUNTER — Telehealth: Payer: Self-pay | Admitting: Cardiology

## 2023-08-11 DIAGNOSIS — E785 Hyperlipidemia, unspecified: Secondary | ICD-10-CM

## 2023-08-11 NOTE — Telephone Encounter (Signed)
Pt wants to discuss getting labs done

## 2023-08-12 ENCOUNTER — Other Ambulatory Visit: Payer: Self-pay | Admitting: Nurse Practitioner

## 2023-08-12 DIAGNOSIS — Z1231 Encounter for screening mammogram for malignant neoplasm of breast: Secondary | ICD-10-CM

## 2023-08-12 NOTE — Telephone Encounter (Signed)
Will go for lipid and LFT Jan 22,2025. Ig ALT WNL will add Zetia. Previously Repatha was denied. Patient has to try and fail Zetia before going on injectables

## 2023-08-14 LAB — HEPATIC FUNCTION PANEL
ALT: 37 [IU]/L — ABNORMAL HIGH (ref 0–32)
AST: 27 [IU]/L (ref 0–40)
Albumin: 4.7 g/dL (ref 3.9–4.9)
Alkaline Phosphatase: 62 [IU]/L (ref 44–121)
Bilirubin Total: 0.5 mg/dL (ref 0.0–1.2)
Bilirubin, Direct: 0.14 mg/dL (ref 0.00–0.40)
Total Protein: 6.5 g/dL (ref 6.0–8.5)

## 2023-08-14 LAB — LIPID PANEL
Chol/HDL Ratio: 2.8 {ratio} (ref 0.0–4.4)
Cholesterol, Total: 211 mg/dL — ABNORMAL HIGH (ref 100–199)
HDL: 75 mg/dL (ref >39)
LDL Chol Calc (NIH): 103 mg/dL — ABNORMAL HIGH (ref 0–99)
Triglycerides: 196 mg/dL — ABNORMAL HIGH (ref 0–149)
VLDL Cholesterol Cal: 33 mg/dL (ref 5–40)

## 2023-08-15 ENCOUNTER — Telehealth: Payer: Self-pay | Admitting: Pharmacy Technician

## 2023-08-15 ENCOUNTER — Other Ambulatory Visit (HOSPITAL_COMMUNITY): Payer: Self-pay

## 2023-08-15 NOTE — Telephone Encounter (Signed)
Pharmacy Patient Advocate Encounter   Received notification from CoverMyMeds that prior authorization for EPINEPHrine 0.3MG /0.3ML auto-injectors is required/requested.   Insurance verification completed.   The patient is insured through [facility] .   Per test claim: The current 30 day co-pay is, $47.00.  No PA needed at this time. This test claim was processed through [facility]- copay amounts may vary at other pharmacies due to pharmacy/plan contracts, or as the patient moves through the different stages of their insurance plan.

## 2023-08-18 ENCOUNTER — Other Ambulatory Visit (HOSPITAL_COMMUNITY): Payer: Self-pay

## 2023-08-18 ENCOUNTER — Telehealth: Payer: Self-pay | Admitting: Pharmacy Technician

## 2023-08-18 DIAGNOSIS — E785 Hyperlipidemia, unspecified: Secondary | ICD-10-CM

## 2023-08-18 NOTE — Telephone Encounter (Signed)
-----   Message from Tylene Fantasia sent at 08/18/2023  9:13 AM EST ----- Please initiate PA for PCSK9i ----- Message ----- From: Rollene Rotunda, MD Sent: 08/17/2023  12:09 PM EST To: Tylene Fantasia, RPH  Hi,  Will she qualify for PCSK9 now?  She is on Zetia. ----- Message ----- From: Nell Range Lab Results In Sent: 08/14/2023   7:35 PM EST To: Rollene Rotunda, MD

## 2023-08-18 NOTE — Telephone Encounter (Signed)
Pharmacy Patient Advocate Encounter   Received notification from  results  that prior authorization for repatha is required/requested.   Insurance verification completed.   The patient is insured through [facility] ADVANTAGE/RX ADVANCE .   Per test claim: PA required; PA submitted to above mentioned insurance via CoverMyMeds Key/confirmation #/EOC [facility] Status is pending

## 2023-08-20 ENCOUNTER — Other Ambulatory Visit (HOSPITAL_COMMUNITY): Payer: Self-pay

## 2023-08-20 NOTE — Telephone Encounter (Signed)
Pharmacy Patient Advocate Encounter  Received notification from [facility] that Prior Authorization for repatha has been DENIED.  Full denial letter will be uploaded to the media tab. See denial reason below.  Submitted with E78.5   PA #/Case ID/Reference #: [id number]

## 2023-08-21 MED ORDER — ROSUVASTATIN CALCIUM 40 MG PO TABS: 40.0000 mg | ORAL_TABLET | Freq: Every day | ORAL | 3 refills | Status: DC

## 2023-08-21 NOTE — Telephone Encounter (Signed)
Spoke to patient about the Repatha denial and insurance criteria. Patient has family hx of premature ASCVD, her CAC score in 2022 was 0 and her untreated LDLc never been >190 mg/dl. Per [place] Criteria for Familial Hypercholesterolemia (FH) she would get 2 points which indicates unlikely FH.   Patient reports she had tolerated Crestor 40 mg well in the past. She would like to try that Given her above goal TG and LDL will increase current statin dose rosuvastatin 20 to 40 mg with close monitoring on LFT ( hx of enzyme elevation due to alcohol may be for statin)   Prescription for 40 mg Crestor sent to  preferred pharmacy. Patient is aware to go for lab on April 30,2025.

## 2023-08-21 NOTE — Addendum Note (Signed)
Addended by: Tylene Fantasia on: 08/21/2023 08:50 AM   Modules accepted: Orders

## 2023-09-02 ENCOUNTER — Ambulatory Visit
Admission: RE | Admit: 2023-09-02 | Discharge: 2023-09-02 | Disposition: A | Discharge status: Home / Self Care | Payer: Medicare PPO | Source: Ambulatory Visit

## 2023-09-02 DIAGNOSIS — Z1231 Encounter for screening mammogram for malignant neoplasm of breast: Secondary | ICD-10-CM

## 2023-09-04 ENCOUNTER — Encounter: Payer: Self-pay | Admitting: Nurse Practitioner

## 2023-09-08 ENCOUNTER — Encounter: Payer: Self-pay | Admitting: Nurse Practitioner

## 2023-09-08 MED ORDER — ACYCLOVIR 5 % EX OINT: 1.0000 | TOPICAL_OINTMENT | CUTANEOUS | 0 refills | Status: DC

## 2023-09-09 ENCOUNTER — Encounter: Payer: Self-pay | Admitting: Nurse Practitioner

## 2023-09-29 ENCOUNTER — Ambulatory Visit (INDEPENDENT_AMBULATORY_CARE_PROVIDER_SITE_OTHER)

## 2023-09-29 DIAGNOSIS — Z0000 Encounter for general adult medical examination without abnormal findings: Secondary | ICD-10-CM | POA: Diagnosis not present

## 2023-09-29 NOTE — Progress Notes (Signed)
 Subjective:   Patricia Hampton is a 73 y.o. who presents for a Medicare Wellness preventive visit.  Visit Complete: Virtual I connected with  Claud Kelp on 09/29/23 by a audio enabled telemedicine application and verified that I am speaking with the correct person using two identifiers.  Patient Location: Home  Provider Location: Office/Clinic  I discussed the limitations of evaluation and management by telemedicine. The patient expressed understanding and agreed to proceed.  Vital Signs: Because this visit was a virtual/telehealth visit, some criteria may be missing or patient reported. Any vitals not documented were not able to be obtained and vitals that have been documented are patient reported.  VideoError- Librarian, academic were attempted between this provider and patient, however failed, due to patient having technical difficulties OR patient did not have access to video capability.  We continued and completed visit with audio only.   AWV Questionnaire: No: Patient Medicare AWV questionnaire was not completed prior to this visit.  Cardiac Risk Factors include: advanced age (>16men, >51 women);dyslipidemia     Objective:    Today's Vitals   There is no height or weight on file to calculate BMI.     09/29/2023   11:40 AM 09/27/2022    3:31 PM 09/12/2021   12:56 PM 03/14/2021    3:06 PM 10/16/2020    3:14 PM 08/29/2020    3:10 PM 08/18/2019    8:55 AM  Advanced Directives  Does Patient Have a Medical Advance Directive? Yes Yes Yes Yes Yes Yes Yes  Type of Estate agent of [place];Living will Healthcare Power of [place];Living will Healthcare Power of [place];Living will Living will;Healthcare Power of Attorney Living will;Healthcare Power of [employer] Power of [place];Living will Healthcare Power of [place];Living will  Does patient want to make changes to medical advance directive?       No - Patient  declined  Copy of Healthcare Power of Attorney in Chart? No - copy requested No - copy requested No - copy requested No - copy requested  No - copy requested No - copy requested    Current Medications (verified) Outpatient Encounter Medications as of 09/29/2023  Medication Sig   acyclovir ointment (ZOVIRAX) 5 % Apply 1 Application topically every 3 (three) hours.   aspirin EC 81 MG tablet Take 1 tablet (81 mg total) by mouth daily.   Biotin 1000 MCG tablet Take 1 tablet (1 mg total) by mouth daily.   Calcium Carbonate-Vitamin D 600-400 MG-UNIT tablet Take 1 tablet by mouth 2 (two) times daily.   EPINEPHrine (EPIPEN 2-PAK) 0.3 mg/0.3 mL IJ SOAJ injection Inject 0.3 mg into the muscle as needed for anaphylaxis.   Omega-3 Fatty Acids (FISH OIL) 1000 MG CAPS Take 2qd   rosuvastatin (CRESTOR) 40 MG tablet Take 1 tablet (40 mg total) by mouth daily.   spironolactone (ALDACTONE) 100 MG tablet TAKE 1 TABLET BY MOUTH EVERY DAY   tretinoin (RETIN-A) 0.025 % cream Apply topically at bedtime.   valACYclovir (VALTREX) 1000 MG tablet Take 1 tablet (1,000 mg total) by mouth as needed.   ALPRAZolam (XANAX) 0.25 MG tablet Take 1 tablet (0.25 mg total) by mouth at bedtime as needed for anxiety. (Patient not taking: Reported on 09/29/2023)   No facility-administered encounter medications on file as of 09/29/2023.    Allergies (verified) Bee venom   History: Past Medical History:  Diagnosis Date   Allergy    Anxiety    Cataract Nov 19, 2020  I had cataract surgery in my right eye. I have cataracts in my left eye, but it is not ready for surgery.   GERD (gastroesophageal reflux disease)    Hyperlipidemia    Osteopenia 02/19/2005   Urinary incontinence    Past Surgical History:  Procedure Laterality Date   bladder tack     BREAST EXCISIONAL BIOPSY Left    CATARACT EXTRACTION Left 05/14/2023   CESAREAN SECTION     COLONOSCOPY  08/2006   EYE SURGERY  Nov 23, 2021   Right eye cataract   REDUCTION  MAMMAPLASTY Bilateral 05/13/2017   REPEAT CESAREAN SECTION     Family History  Problem Relation Age of Onset   Hyperlipidemia Mother    Heart attack Mother 47       Died of MI   Early death Mother    Stroke Father    Heart attack Father 97   Hypertension Sister    Depression Sister    Alcohol abuse Sister    Stroke Sister    Early death Sister    Obesity Sister    Migraines Sister    Hyperlipidemia Brother    Cancer Brother        ESOPHAGEAL   Esophageal cancer Brother    Early death Brother    Diabetes Brother    Hypertension Brother    Depression Brother    Hyperlipidemia Brother    Cancer Maternal Grandmother        breast   Breast cancer Maternal Grandmother    Diabetes Paternal Grandfather    Colon cancer Neg Hx    Colon polyps Neg Hx    Rectal cancer Neg Hx    Stomach cancer Neg Hx    Social History   Socioeconomic History   Marital status: Married    Spouse name: Not on file   Number of children: 2   Years of education: Not on file   Highest education level: Bachelor's degree (e.g., BA, AB, BS)  Occupational History    Employer: [employer]  Tobacco Use   Smoking status: Never   Smokeless tobacco: Never  Vaping Use   Vaping status: Never Used  Substance and Sexual Activity   Alcohol use: Yes    Alcohol/week: 10.0 standard drinks of alcohol    Types: 5 Glasses of wine, 5 Cans of beer per week    Comment: 8-10 glasses wine per week per pt   Drug use: No   Sexual activity: Yes  Other Topics Concern   Not on file  Social History Narrative   Are you right handed or left handed? Right    Are you currently employed ? Retired   What is your current occupation?   Do you live at home alone? NO   Who lives with you? Husband   What type of home do you live in: 1 story or 2 story? 2       Social Drivers of Corporate investment banker Strain: Low Risk  (09/29/2023)   Overall Financial Resource Strain (CARDIA)    Difficulty of Paying Living  Expenses: Not hard at all  Food Insecurity: No Food Insecurity (09/29/2023)   Hunger Vital Sign    Worried About Running Out of Food in the Last Year: Never true    Ran Out of Food in the Last Year: Never true  Transportation Needs: No Transportation Needs (09/29/2023)   PRAPARE - Administrator, Civil Service (Medical): No  Lack of Transportation (Non-Medical): No  Physical Activity: Sufficiently Active (09/29/2023)   Exercise Vital Sign    Days of Exercise per Week: 6 days    Minutes of Exercise per Session: 60 min  Stress: No Stress Concern Present (09/29/2023)   [employer] of Occupational Health - Occupational Stress Questionnaire    Feeling of Stress : Not at all  Social Connections: Socially Integrated (09/29/2023)   Social Connection and Isolation Panel [NHANES]    Frequency of Communication with Friends and Family: More than three times a week    Frequency of Social Gatherings with Friends and Family: Twice a week    Attends Religious Services: More than 4 times per year    Active Member of [employer] or Organizations: Yes    Attends Engineer, structural: More than 4 times per year    Marital Status: Married    Tobacco Counseling Counseling given: Not Answered    Clinical Intake:  Pre-visit preparation completed: Yes  Pain : No/denies pain     Nutritional Risks: None Diabetes: No  How often do you need to have someone help you when you read instructions, pamphlets, or other written materials from your doctor or pharmacy?: 1 - Never  Interpreter Needed?: No  Information entered by :: NAllen LPN   Activities of Daily Living     09/29/2023   11:33 AM  In your present state of health, do you have any difficulty performing the following activities:  Hearing? 0  Vision? 0  Difficulty concentrating or making decisions? 0  Walking or climbing stairs? 0  Dressing or bathing? 0  Doing errands, shopping? 0  Preparing Food and eating ? N   Using the Toilet? N  In the past six months, have you accidently leaked urine? N  Do you have problems with loss of bowel control? N  Managing your Medications? N  Managing your Finances? N  Housekeeping or managing your Housekeeping? N    Patient Care Team: Gerre Scull, NP as PCP - General (Internal Medicine) Rollene Rotunda, MD as PCP - Cardiology (Cardiology)  Indicate any recent Medical Services you may have received from other than [facility] providers in the past year (date may be approximate).     Assessment:   This is a routine wellness examination for [place].  Hearing/Vision screen Hearing Screening - Comments:: Denies hearing issues Vision Screening - Comments:: Regular eye exams, [facility]   Goals Addressed             This Visit's Progress    Patient Stated       09/29/2023, wants to lower cholesterol       Depression Screen     09/29/2023   11:41 AM 03/12/2023    9:57 AM 09/27/2022    3:32 PM 09/11/2022    9:49 AM 06/04/2022    5:26 PM 09/12/2021   12:57 PM 09/12/2021   12:53 PM  PHQ 2/9 Scores  PHQ - 2 Score 0 1 0 0 0 0 0  PHQ- 9 Score 1 4  1        Fall Risk     09/29/2023   11:40 AM 03/12/2023    9:15 AM 09/26/2022    8:26 PM 09/11/2022    9:25 AM 09/12/2021   12:56 PM  Fall Risk   Falls in the past year? 0 0 0 0 0  Number falls in past yr: 0 0 0 0 0  Injury with Fall? 0 0 0  0 0  Risk for fall due to : Medication side effect No Fall Risks Medication side effect No Fall Risks No Fall Risks  Follow up Falls prevention discussed;Falls evaluation completed Falls evaluation completed Falls prevention discussed;Education provided;Falls evaluation completed Falls evaluation completed Falls evaluation completed    MEDICARE RISK AT HOME:  Medicare Risk at Home Any stairs in or around the home?: Yes If so, are there any without handrails?: No Home free of loose throw rugs in walkways, pet beds, electrical cords, etc?: Yes Adequate lighting in your  home to reduce risk of falls?: Yes Life alert?: No Use of a cane, walker or w/c?: No Grab bars in the bathroom?: No Shower chair or bench in shower?: Yes Elevated toilet seat or a handicapped toilet?: No  TIMED UP AND GO:  Was the test performed?  No  Cognitive Function: 6CIT completed        09/29/2023   11:42 AM 09/27/2022    3:35 PM  6CIT Screen  What Year? 0 points 0 points  What month? 0 points 0 points  What time? 0 points 0 points  Count back from 20 0 points 2 points  Months in reverse 0 points 0 points  Repeat phrase 0 points 0 points  Total Score 0 points 2 points    Immunizations Immunization History  Administered Date(s) Administered   Fluad Quad(high Dose 65+) 04/05/2019, 04/11/2022   Influenza,inj,Quad PF,6+ Mos 04/25/2015, 03/15/2017, 04/28/2018   Influenza-Unspecified 06/19/2020, 05/21/2021, 03/27/2023   PFIZER(Purple Top)SARS-COV-2 Vaccination 08/10/2019, 08/28/2019, 05/16/2020   Pneumococcal Conjugate-13 06/22/2018   Pneumococcal Polysaccharide-23 02/25/2020   Rabies, IM 02/22/2012, 02/25/2012, 02/29/2012, 03/07/2012   Rabies, intradermal 02/22/2012   Rsv, Bivalent, Protein Subunit Rsvpref,pf (Abrysvo) 08/02/2022   Td 01/16/2005   Tdap 09/30/2016   Zoster Recombinant(Shingrix) 04/28/2018, 07/21/2018   Zoster, Live 05/11/2013    Screening Tests Health Maintenance  Topic Date Due   COVID-19 Vaccine (4 - 2024-25 season) 03/23/2023   Colonoscopy  02/10/2024   MAMMOGRAM  09/01/2024   Medicare Annual Wellness (AWV)  09/28/2024   DTaP/Tdap/Td (3 - Td or Tdap) 10/01/2026   Pneumonia Vaccine 52+ Years old  Completed   INFLUENZA VACCINE  Completed   DEXA SCAN  Completed   Hepatitis C Screening  Completed   Zoster Vaccines- Shingrix  Completed   HPV VACCINES  Aged Out    Health Maintenance  Health Maintenance Due  Topic Date Due   COVID-19 Vaccine (4 - 2024-25 season) 03/23/2023   Health Maintenance Items Addressed: Declines covid  vaccine  Additional Screening:  Vision Screening: Recommended annual ophthalmology exams for early detection of glaucoma and other disorders of the eye.  Dental Screening: Recommended annual dental exams for proper oral hygiene  Community Resource Referral / Chronic Care Management: CRR required this visit?  No   CCM required this visit?  No     Plan:     I have personally reviewed and noted the following in the patient's chart:   Medical and social history Use of alcohol, tobacco or illicit drugs  Current medications and supplements including opioid prescriptions. Patient is not currently taking opioid prescriptions. Functional ability and status Nutritional status Physical activity Advanced directives List of other physicians Hospitalizations, surgeries, and ER visits in previous 12 months Vitals Screenings to include cognitive, depression, and falls Referrals and appointments  In addition, I have reviewed and discussed with patient certain preventive protocols, quality metrics, and best practice recommendations. A written personalized care plan for preventive services as  well as general preventive health recommendations were provided to patient.     Barb Merino, LPN   1/61/0960   After Visit Summary: (MyChart) Due to this being a telephonic visit, the after visit summary with patients personalized plan was offered to patient via MyChart   Notes: Nothing significant to report at this time.

## 2023-09-29 NOTE — Patient Instructions (Signed)
 [name] , Thank you for taking time to come for your Medicare Wellness Visit. I appreciate your ongoing commitment to your health goals. Please review the following plan we discussed and let me know if I can assist you in the future.   Referrals/Orders/Follow-Ups/Clinician Recommendations: none  This is a list of the screening recommended for you and due dates:  Health Maintenance  Topic Date Due   COVID-19 Vaccine (4 - 2024-25 season) 03/23/2023   Colon Cancer Screening  02/10/2024   Mammogram  09/01/2024   Medicare Annual Wellness Visit  09/28/2024   DTaP/Tdap/Td vaccine (3 - Td or Tdap) 10/01/2026   Pneumonia Vaccine  Completed   Flu Shot  Completed   DEXA scan (bone density measurement)  Completed   Hepatitis C Screening  Completed   Zoster (Shingles) Vaccine  Completed   HPV Vaccine  Aged Out    Advanced directives: (Copy Requested) Please bring a copy of your health care power of attorney and living will to the office to be added to your chart at your convenience. You can mail to [facility] [address] [place], [place] [address] or email to [email or web address]  Next Medicare Annual Wellness Visit scheduled for next year: Yes  insert Preventive Care attachment Insert FALL PREVENTION attachment if needed

## 2023-10-14 ENCOUNTER — Encounter: Payer: Medicare PPO | Admitting: Nurse Practitioner

## 2023-10-24 ENCOUNTER — Encounter: Payer: Self-pay | Admitting: Nurse Practitioner

## 2023-10-24 ENCOUNTER — Ambulatory Visit: Payer: Medicare PPO | Admitting: Nurse Practitioner

## 2023-10-24 VITALS — BP 128/88 | HR 67 | Temp 97.5°F | Ht 60.0 in | Wt 124.0 lb

## 2023-10-24 DIAGNOSIS — E782 Mixed hyperlipidemia: Secondary | ICD-10-CM | POA: Diagnosis not present

## 2023-10-24 DIAGNOSIS — E559 Vitamin D deficiency, unspecified: Secondary | ICD-10-CM

## 2023-10-24 DIAGNOSIS — F419 Anxiety disorder, unspecified: Secondary | ICD-10-CM

## 2023-10-24 LAB — CBC WITH DIFFERENTIAL/PLATELET
Basophils Absolute: 0 10*3/uL (ref 0.0–0.1)
Basophils Relative: 0.5 % (ref 0.0–3.0)
Eosinophils Absolute: 0.2 10*3/uL (ref 0.0–0.7)
Eosinophils Relative: 3 % (ref 0.0–5.0)
HCT: 40.2 % (ref 36.0–46.0)
Hemoglobin: 13.7 g/dL (ref 12.0–15.0)
Lymphocytes Relative: 33.5 % (ref 12.0–46.0)
Lymphs Abs: 1.7 10*3/uL (ref 0.7–4.0)
MCHC: 33.9 g/dL (ref 30.0–36.0)
MCV: 100.5 fl — ABNORMAL HIGH (ref 78.0–100.0)
Monocytes Absolute: 0.5 10*3/uL (ref 0.1–1.0)
Monocytes Relative: 9.7 % (ref 3.0–12.0)
Neutro Abs: 2.7 10*3/uL (ref 1.4–7.7)
Neutrophils Relative %: 53.3 % (ref 43.0–77.0)
Platelets: 246 10*3/uL (ref 150.0–400.0)
RBC: 4 Mil/uL (ref 3.87–5.11)
RDW: 13 % (ref 11.5–15.5)
WBC: 5 10*3/uL (ref 4.0–10.5)

## 2023-10-24 LAB — COMPREHENSIVE METABOLIC PANEL WITH GFR
ALT: 28 U/L (ref 0–35)
AST: 24 U/L (ref 0–37)
Albumin: 4.6 g/dL (ref 3.5–5.2)
Alkaline Phosphatase: 45 U/L (ref 39–117)
BUN: 14 mg/dL (ref 6–23)
CO2: 29 meq/L (ref 19–32)
Calcium: 9.2 mg/dL (ref 8.4–10.5)
Chloride: 101 meq/L (ref 96–112)
Creatinine, Ser: 0.63 mg/dL (ref 0.40–1.20)
GFR: 89.86 mL/min (ref >60.00)
Glucose, Bld: 101 mg/dL — ABNORMAL HIGH (ref 70–99)
Potassium: 4 meq/L (ref 3.5–5.1)
Sodium: 138 meq/L (ref 135–145)
Total Bilirubin: 0.6 mg/dL (ref 0.2–1.2)
Total Protein: 6.6 g/dL (ref 6.0–8.3)

## 2023-10-24 LAB — LIPID PANEL
Cholesterol: 184 mg/dL (ref 0–200)
HDL: 89.7 mg/dL (ref >39.00)
LDL Cholesterol: 80 mg/dL (ref 0–99)
NonHDL: 94.28
Total CHOL/HDL Ratio: 2
Triglycerides: 71 mg/dL (ref 0.0–149.0)
VLDL: 14.2 mg/dL (ref 0.0–40.0)

## 2023-10-24 LAB — VITAMIN D 25 HYDROXY (VIT D DEFICIENCY, FRACTURES): VITD: 49.15 ng/mL (ref 30.00–100.00)

## 2023-10-24 NOTE — Assessment & Plan Note (Signed)
 She has discontinued Xanax and manages anxiety with meditation and prayer, experiencing no withdrawal symptoms.

## 2023-10-24 NOTE — Progress Notes (Signed)
   Established Patient Office Visit  Subjective   Patient ID: Patricia Hampton, female    DOB: 1950-09-13  Age: 73 y.o. MRN: [id number]  Chief Complaint  Patient presents with   Hyperlipidemia    Follow up, patient is fasting to have lab work done    HPI  Discussed the use of AI scribe software for clinical note transcription with the patient, who gave verbal consent to proceed.  History of Present Illness   The patient, with a history of hyperlipidemia, presents for a follow-up after an increase in rosuvastatin. She reports feeling fine since the medication adjustment and denies any chest pain or shortness of breath. She is also taking vitamin D 5000 units daily.  The patient has stopped taking Xanax for sleep about three months ago and reports no withdrawal symptoms. She is managing her worry and anxiety through meditation and prayer.        ROS See pertinent positives and negatives per HPI.    Objective:     BP 128/88 (BP Location: Left Arm, Patient Position: Sitting, Cuff Size: Small)   Pulse 67   Temp (!) 97.5 F (36.4 C)   Ht 5' (1.524 m)   Wt 124 lb (56.2 kg)   SpO2 97%   BMI 24.22 kg/m    Physical Exam Vitals and nursing note reviewed.  Constitutional:      General: She is not in acute distress.    Appearance: Normal appearance.  HENT:     Head: Normocephalic.  Eyes:     Conjunctiva/sclera: Conjunctivae normal.  Cardiovascular:     Rate and Rhythm: Normal rate and regular rhythm.     Pulses: Normal pulses.     Heart sounds: Normal heart sounds.  Pulmonary:     Effort: Pulmonary effort is normal.     Breath sounds: Normal breath sounds.  Musculoskeletal:     Cervical back: Normal range of motion.  Skin:    General: Skin is warm.  Neurological:     General: No focal deficit present.     Mental Status: She is alert and oriented to person, place, and time.  Psychiatric:        Mood and Affect: Mood normal.        Behavior: Behavior normal.         Thought Content: Thought content normal.        Judgment: Judgment normal.    The 10-year ASCVD risk score (Arnett DK, et al., 2019) is: 9.1%    Assessment & Plan:   Problem List Items Addressed This Visit       Other   HLD (hyperlipidemia) - Primary   Chronic, ongoing. Rosuvastatin increased to 40mg  a few months ago, she reports no muscle cramps. Check CMP, CBC, lipid panel today.       Relevant Orders   CBC with Differential/Platelet   Comprehensive metabolic panel with GFR   Lipid panel   Anxiety   She has discontinued Xanax and manages anxiety with meditation and prayer, experiencing no withdrawal symptoms.       Vitamin D insufficiency   She takes 5000 units daily. Vitamin D levels will be checked with today's lab results for adequacy.      Relevant Orders   VITAMIN D 25 Hydroxy (Vit-D Deficiency, Fractures)    Return in about 5 months (around 03/25/2024) for CPE.    Gerre Scull, NP

## 2023-10-24 NOTE — Patient Instructions (Signed)
 It was great to see you!  We are checking your labs today and will let you know the results via mychart/phone.   Let's follow-up in 5 months, sooner if you have concerns.  If a referral was placed today, you will be contacted for an appointment. Please note that routine referrals can sometimes take up to 3-4 weeks to process. Please call our office if you haven't heard anything after this time frame.  Take care,  Rodman Pickle, NP

## 2023-10-24 NOTE — Assessment & Plan Note (Signed)
 Chronic, ongoing. Rosuvastatin increased to 40mg  a few months ago, she reports no muscle cramps. Check CMP, CBC, lipid panel today.

## 2023-10-24 NOTE — Assessment & Plan Note (Signed)
 She takes 5000 units daily. Vitamin D levels will be checked with today's lab results for adequacy.

## 2023-11-21 ENCOUNTER — Other Ambulatory Visit: Payer: Self-pay | Admitting: Nurse Practitioner

## 2023-11-21 ENCOUNTER — Other Ambulatory Visit: Payer: Self-pay | Admitting: Cardiology

## 2023-11-21 DIAGNOSIS — Z8619 Personal history of other infectious and parasitic diseases: Secondary | ICD-10-CM

## 2023-11-21 DIAGNOSIS — E785 Hyperlipidemia, unspecified: Secondary | ICD-10-CM

## 2023-11-21 MED ORDER — ROSUVASTATIN CALCIUM 40 MG PO TABS
40.0000 mg | ORAL_TABLET | Freq: Every day | ORAL | 3 refills | Status: DC
Start: 2023-11-21 — End: 2024-02-18

## 2023-11-21 MED ORDER — VALACYCLOVIR HCL 1 G PO TABS: 1000.0000 mg | ORAL_TABLET | ORAL | 0 refills | Status: DC | PRN

## 2023-11-21 NOTE — Telephone Encounter (Signed)
 Requesting: valACYclovir  (VALTREX ) 1000 MG tablet  Last Visit: 10/24/2023 Next Visit: Visit date not found Last Refill: 07/24/2023  Please Advise

## 2023-12-05 ENCOUNTER — Encounter: Payer: Self-pay | Admitting: Internal Medicine

## 2023-12-16 ENCOUNTER — Encounter: Payer: Self-pay | Admitting: Nurse Practitioner

## 2023-12-16 NOTE — Telephone Encounter (Signed)
 I called patient and offered a earlier appointment with another provider and she wanted to keep appointment with Lauren on Tuesday.

## 2023-12-23 ENCOUNTER — Ambulatory Visit (INDEPENDENT_AMBULATORY_CARE_PROVIDER_SITE_OTHER): Admitting: Nurse Practitioner

## 2023-12-23 ENCOUNTER — Ambulatory Visit (HOSPITAL_BASED_OUTPATIENT_CLINIC_OR_DEPARTMENT_OTHER)
Admission: RE | Admit: 2023-12-23 | Discharge: 2023-12-23 | Disposition: A | Discharge status: Home / Self Care | Source: Ambulatory Visit | Attending: Nurse Practitioner | Admitting: Nurse Practitioner

## 2023-12-23 ENCOUNTER — Encounter: Payer: Self-pay | Admitting: Nurse Practitioner

## 2023-12-23 VITALS — BP 110/70 | HR 71 | Temp 97.2°F | Ht 60.0 in | Wt 124.4 lb

## 2023-12-23 DIAGNOSIS — G8929 Other chronic pain: Secondary | ICD-10-CM

## 2023-12-23 DIAGNOSIS — M25511 Pain in right shoulder: Secondary | ICD-10-CM

## 2023-12-23 DIAGNOSIS — M79601 Pain in right arm: Secondary | ICD-10-CM | POA: Diagnosis not present

## 2023-12-23 NOTE — Patient Instructions (Signed)
 It was great to see you!  Start ibuprofen 600mg  3 times a day with food for 1 week   Start using ice/heat to your arm, whichever feels better  We will get an xray of your shoulder.  [facility] [address] [place], [place] [address]  Let's follow-up if symptoms worsen or don't improve   Take care,  Rheba Cedar, NP

## 2023-12-23 NOTE — Progress Notes (Signed)
 Acute Office Visit  Subjective:     Patient ID: Patricia Hampton, female    DOB: 02-14-1951, 73 y.o.   MRN: [id number]  Chief Complaint  Patient presents with   Shoulder Pain    Right arm/shoulder pain for 1 month, trouble with range of motion   HPI:  Discussed the use of AI scribe software for clinical note transcription with the patient, who gave verbal consent to proceed.  History of Present Illness   Patricia COMES "Patricia Hampton" is a 73 year old female who presents with right shoulder and arm pain.  She has experienced right shoulder and arm pain for about a month, which worsens at night and with movement, especially when moving her arm behind her back. The pain radiates down the arm without a specific injury. A similar episode occurred several years ago and resolved spontaneously.  Ibuprofen provides some relief. She has not used heat or ice. She recently began kickboxing, which may have aggravated the condition.   There is no weakness in the arm or hand, no pain upon touch, and no significant elbow pain. She has a limited range of motion, particularly when lifting the arm behind her, and mild discomfort in the lower arm.      ROS See pertinent positives and negatives per HPI.     Objective:    BP 110/70 (BP Location: Left Arm, Patient Position: Sitting, Cuff Size: Small)   Pulse 71   Temp (!) 97.2 F (36.2 C)   Ht 5' (1.524 m)   Wt 124 lb 6.4 oz (56.4 kg)   SpO2 98%   BMI 24.30 kg/m    Physical Exam Vitals and nursing note reviewed.  Constitutional:      General: She is not in acute distress.    Appearance: Normal appearance.  HENT:     Head: Normocephalic.  Eyes:     Conjunctiva/sclera: Conjunctivae normal.  Pulmonary:     Effort: Pulmonary effort is normal.  Musculoskeletal:        General: Tenderness (right shoulder slight) present. No swelling.     Cervical back: Normal range of motion.     Comments: Right shoulder ROM limited due to pain, empty  can test negative, grip strength equal  Skin:    General: Skin is warm.  Neurological:     General: No focal deficit present.     Mental Status: She is alert and oriented to person, place, and time.  Psychiatric:        Mood and Affect: Mood normal.        Behavior: Behavior normal.        Thought Content: Thought content normal.        Judgment: Judgment normal.      Assessment & Plan:   Right Shoulder and Arm Pain   Right shoulder and arm pain has persisted for one month, likely worsened by recent kickboxing. Possible causes include arthritis, tendonitis, or musculoskeletal strain. There is slight improvement with ibuprofen. An x-ray is needed for baseline and to assess any pathology. Discussed ibuprofen's gastrointestinal side effects and potential to raise blood pressure, with naproxen as an alternative. Order an x-ray of the right shoulder and elbow. Advise taking ibuprofen 600 mg three times daily with food for one week. Recommend applying heat or ice to the affected area. Instruct to report if symptoms do not improve or worsen.    No orders of the defined types were placed in this encounter.   Return if  symptoms worsen or fail to improve.  Odette Benjamin, NP

## 2023-12-24 ENCOUNTER — Encounter: Payer: Self-pay | Admitting: Nurse Practitioner

## 2023-12-25 ENCOUNTER — Ambulatory Visit: Payer: Self-pay | Admitting: Nurse Practitioner

## 2023-12-31 ENCOUNTER — Ambulatory Visit (AMBULATORY_SURGERY_CENTER)

## 2023-12-31 VITALS — Ht 60.0 in | Wt 123.0 lb

## 2023-12-31 DIAGNOSIS — Z860100 Personal history of colon polyps, unspecified: Secondary | ICD-10-CM

## 2023-12-31 MED ORDER — NA SULFATE-K SULFATE-MG SULF 17.5-3.13-1.6 GM/177ML PO SOLN: 1.0000 | Freq: Once | ORAL | 0 refills | Status: AC

## 2023-12-31 NOTE — Progress Notes (Signed)

## 2024-01-19 NOTE — Progress Notes (Unsigned)
 [facility] History and Physical   Primary Care Physician:  Nedra Tinnie LABOR, NP   Reason for Procedure:  History of colon polyps  Plan:    Colonoscopy     HPI: Patricia Hampton is a 73 y.o. female status post removal of 3 diminutive adenomas and a hyperplastic polyp in 2020. Other findings include diverticulosis in the ascending, descending and sigmoid colon.  Past Medical History:  Diagnosis Date   Allergy    Anxiety    Cataract Nov 19, 2020   I had cataract surgery in my right eye. I have cataracts in my left eye, but it is not ready for surgery.   GERD (gastroesophageal reflux disease)    Hyperlipidemia    Osteopenia 02/19/2005   Urinary incontinence     Past Surgical History:  Procedure Laterality Date   bladder tack     BREAST EXCISIONAL BIOPSY Left    CATARACT EXTRACTION Left 05/14/2023   CESAREAN SECTION     COLONOSCOPY  08/2006   EYE SURGERY  Nov 23, 2021   Right eye cataract   REDUCTION MAMMAPLASTY Bilateral 05/13/2017   REPEAT CESAREAN SECTION        Current Outpatient Medications  Medication Sig Dispense Refill   aspirin  EC 81 MG tablet Take 1 tablet (81 mg total) by mouth daily.     Calcium  Carbonate-Vitamin D  600-400 MG-UNIT tablet Take 1 tablet by mouth 2 (two) times daily. 180 tablet 3   cholecalciferol  (VITAMIN D3) 25 MCG (1000 UNIT) tablet Take 5,000 Units by mouth daily.     Omega-3 Fatty Acids (FISH OIL ) 1000 MG CAPS Take 2qd 180 capsule 3   rosuvastatin  (CRESTOR ) 40 MG tablet Take 1 tablet (40 mg total) by mouth daily. 90 tablet 3   acyclovir  ointment (ZOVIRAX ) 5 % Apply 1 Application topically every 3 (three) hours. (Patient not taking: Reported on 12/31/2023) 15 g 0   EPINEPHrine  (EPIPEN  2-PAK) 0.3 mg/0.3 mL IJ SOAJ injection Inject 0.3 mg into the muscle as needed for anaphylaxis. (Patient not taking: Reported on 01/20/2024) 2 each 1   tretinoin  (RETIN-A ) 0.025 % cream Apply topically at bedtime. 45 g 11   valACYclovir  (VALTREX )  1000 MG tablet Take 1 tablet (1,000 mg total) by mouth as needed. 10 tablet 0   Current Facility-Administered Medications  Medication Dose Route Frequency Provider Last Rate Last Admin   0.9 %  sodium chloride  infusion  500 mL Intravenous Once Avram Lupita BRAVO, MD        Allergies as of 01/20/2024 - Review Complete 01/20/2024  Allergen Reaction Noted   Bee venom Hives 03/15/2022    Family History  Problem Relation Age of Onset   Hyperlipidemia Mother    Heart attack Mother 81       Died of MI   Early death Mother    Stroke Father    Heart attack Father 42   Hypertension Sister    Depression Sister    Alcohol abuse Sister    Stroke Sister    Early death Sister    Obesity Sister    Migraines Sister    Hyperlipidemia Brother    Cancer Brother        ESOPHAGEAL   Esophageal cancer Brother    Early death Brother    Diabetes Brother    Hypertension Brother    Depression Brother    Hyperlipidemia Brother    Cancer Maternal Grandmother        breast   Breast cancer Maternal  Grandmother    Diabetes Paternal Grandfather    Colon cancer Neg Hx    Colon polyps Neg Hx    Rectal cancer Neg Hx    Stomach cancer Neg Hx     Social History   Socioeconomic History   Marital status: Married    Spouse name: Not on file   Number of children: 2   Years of education: Not on file   Highest education level: Bachelor's degree (e.g., BA, AB, BS)  Occupational History    Employer: [employer]  Tobacco Use   Smoking status: Never   Smokeless tobacco: Never  Vaping Use   Vaping status: Never Used  Substance and Sexual Activity   Alcohol use: Yes    Alcohol/week: 10.0 standard drinks of alcohol    Types: 5 Glasses of wine, 5 Cans of beer per week    Comment: 8-10 glasses wine per week per pt   Drug use: No   Sexual activity: Yes  Other Topics Concern   Not on file  Social History Narrative   Are you right handed or left handed? Right    Are you currently employed ?  Retired   What is your current occupation?   Do you live at home alone? NO   Who lives with you? Husband   What type of home do you live in: 1 story or 2 story? 2       Social Drivers of Corporate investment banker Strain: Low Risk  (10/23/2023)   Overall Financial Resource Strain (CARDIA)    Difficulty of Paying Living Expenses: Not hard at all  Food Insecurity: No Food Insecurity (10/23/2023)   Hunger Vital Sign    Worried About Running Out of Food in the Last Year: Never true    Ran Out of Food in the Last Year: Never true  Transportation Needs: No Transportation Needs (10/23/2023)   PRAPARE - Administrator, Civil Service (Medical): No    Lack of Transportation (Non-Medical): No  Physical Activity: Sufficiently Active (10/23/2023)   Exercise Vital Sign    Days of Exercise per Week: 5 days    Minutes of Exercise per Session: 60 min  Stress: Stress Concern Present (10/23/2023)   [employer] of Occupational Health - Occupational Stress Questionnaire    Feeling of Stress : To some extent  Social Connections: Socially Integrated (10/23/2023)   Social Connection and Isolation Panel    Frequency of Communication with Friends and Family: More than three times a week    Frequency of Social Gatherings with Friends and Family: More than three times a week    Attends Religious Services: More than 4 times per year    Active Member of [employer] or Organizations: Yes    Attends Engineer, structural: More than 4 times per year    Marital Status: Married  Catering manager Violence: Not At Risk (09/29/2023)   Humiliation, Afraid, Rape, and Kick questionnaire    Fear of Current or Ex-Partner: No    Emotionally Abused: No    Physically Abused: No    Sexually Abused: No    Review of Systems:  All other review of systems negative except as mentioned in the HPI.  Physical Exam: Vital signs BP 132/66   Pulse 65   Temp 97.6 F (36.4 C)   Ht 5' (1.524 m)   Wt 123 lb (55.8 kg)    SpO2 96%   BMI 24.02 kg/m   General:  Alert,  Well-developed, well-nourished, pleasant and cooperative in NAD Lungs:  Clear throughout to auscultation.   Heart:  Regular rate and rhythm; no murmurs, clicks, rubs,  or gallops. Abdomen:  Soft, nontender and nondistended. Normal bowel sounds.   Neuro/Psych:  Alert and cooperative. Normal mood and affect. A and O x 3   @Logan Vegh  CHARLENA Commander, MD, [facility] [phone] (pager) 01/20/2024 9:13 AM@

## 2024-01-20 ENCOUNTER — Encounter: Payer: Self-pay | Admitting: Internal Medicine

## 2024-01-20 ENCOUNTER — Ambulatory Visit: Admitting: Internal Medicine

## 2024-01-20 VITALS — BP 142/60 | HR 54 | Temp 97.6°F | Resp 10 | Ht 60.0 in | Wt 123.0 lb

## 2024-01-20 DIAGNOSIS — D124 Benign neoplasm of descending colon: Secondary | ICD-10-CM

## 2024-01-20 DIAGNOSIS — Z1211 Encounter for screening for malignant neoplasm of colon: Secondary | ICD-10-CM | POA: Diagnosis not present

## 2024-01-20 DIAGNOSIS — D125 Benign neoplasm of sigmoid colon: Secondary | ICD-10-CM

## 2024-01-20 DIAGNOSIS — K5730 Diverticulosis of large intestine without perforation or abscess without bleeding: Secondary | ICD-10-CM

## 2024-01-20 DIAGNOSIS — K644 Residual hemorrhoidal skin tags: Secondary | ICD-10-CM

## 2024-01-20 DIAGNOSIS — Z860101 Personal history of adenomatous and serrated colon polyps: Secondary | ICD-10-CM | POA: Diagnosis not present

## 2024-01-20 DIAGNOSIS — E785 Hyperlipidemia, unspecified: Secondary | ICD-10-CM | POA: Diagnosis not present

## 2024-01-20 DIAGNOSIS — Z860100 Personal history of colon polyps, unspecified: Secondary | ICD-10-CM

## 2024-01-20 DIAGNOSIS — F419 Anxiety disorder, unspecified: Secondary | ICD-10-CM | POA: Diagnosis not present

## 2024-01-20 DIAGNOSIS — D122 Benign neoplasm of ascending colon: Secondary | ICD-10-CM

## 2024-01-20 DIAGNOSIS — Z860102 Personal history of hyperplastic colon polyps: Secondary | ICD-10-CM | POA: Diagnosis not present

## 2024-01-20 DIAGNOSIS — K635 Polyp of colon: Secondary | ICD-10-CM

## 2024-01-20 MED ORDER — SODIUM CHLORIDE 0.9 % IV SOLN: 500.0000 mL | Freq: Once | INTRAVENOUS | Status: DC

## 2024-01-20 NOTE — Progress Notes (Signed)
 Called to room to assist during endoscopic procedure.  Patient ID and intended procedure confirmed with present staff. Received instructions for my participation in the procedure from the performing physician.

## 2024-01-20 NOTE — Patient Instructions (Addendum)
 Three polyps were removed today.  I will let you know pathology results and when to have another routine colonoscopy by mail and/or My Chart.  I also saw diverticulosis as before.  I appreciate the opportunity to care for you. Lupita CHARLENA Commander, MD, [facility]  Resume previous diet Continue present medications  YOU HAD AN ENDOSCOPIC PROCEDURE TODAY AT THE [facility]:   Refer to the procedure report that was given to you for any specific questions about what was found during the examination.  If the procedure report does not answer your questions, please call your gastroenterologist to clarify.  If you requested that your care partner not be given the details of your procedure findings, then the procedure report has been included in a sealed envelope for you to review at your convenience later.  YOU SHOULD EXPECT: Some feelings of bloating in the abdomen. Passage of more gas than usual.  Walking can help get rid of the air that was put into your GI tract during the procedure and reduce the bloating. If you had a lower endoscopy (such as a colonoscopy or flexible sigmoidoscopy) you may notice spotting of blood in your stool or on the toilet paper. If you underwent a bowel prep for your procedure, you may not have a normal bowel movement for a few days.  Please Note:  You might notice some irritation and congestion in your nose or some drainage.  This is from the oxygen used during your procedure.  There is no need for concern and it should clear up in a day or so.  SYMPTOMS TO REPORT IMMEDIATELY:  Following lower endoscopy (colonoscopy or flexible sigmoidoscopy):  Excessive amounts of blood in the stool  Significant tenderness or worsening of abdominal pains  Swelling of the abdomen that is new, acute  Fever of 100F or higher  For urgent or emergent issues, a gastroenterologist can be reached at any hour by calling [phone]. Do not use MyChart messaging for urgent concerns.     DIET:  We do recommend a small meal at first, but then you may proceed to your regular diet.  Drink plenty of fluids but you should avoid alcoholic beverages for 24 hours.  ACTIVITY:  You should plan to take it easy for the rest of today and you should NOT DRIVE or use heavy machinery until tomorrow (because of the sedation medicines used during the test).    FOLLOW UP: Our staff will call the number listed on your records the next business day following your procedure.  We will call around 7:15- 8:00 am to check on you and address any questions or concerns that you may have regarding the information given to you following your procedure. If we do not reach you, we will leave a message.     If any biopsies were taken you will be contacted by phone or by letter within the next 1-3 weeks.  Please call us  at [phone] if you have not heard about the biopsies in 3 weeks.    SIGNATURES/CONFIDENTIALITY: You and/or your care partner have signed paperwork which will be entered into your electronic medical record.  These signatures attest to the fact that that the information above on your After Visit Summary has been reviewed and is understood.  Full responsibility of the confidentiality of this discharge information lies with you and/or your care-partner.   Resume previous diet Continue present medications

## 2024-01-20 NOTE — Progress Notes (Signed)
 Sedate, gd SR, tolerated procedure well, VSS, report to RN

## 2024-01-20 NOTE — Progress Notes (Signed)
 Pt's states no medical or surgical changes since previsit or office visit.

## 2024-01-20 NOTE — Op Note (Signed)
 [facility] Patient Name: Patricia Hampton Procedure Date: 01/20/2024 9:02 AM MRN: [id number] Endoscopist: Lupita FORBES Commander , MD, [id number] Age: 73 Referring MD:  Date of Birth: [birth year] Gender: Female Account #: [id number] Procedure:                Colonoscopy Indications:              Surveillance: Personal history of adenomatous                            polyps on last colonoscopy 5 years ago, Last                            colonoscopy: 2020 Medicines:                Monitored Anesthesia Care Procedure:                Pre-Anesthesia Assessment:                           - Prior to the procedure, a History and Physical                            was performed, and patient medications and                            allergies were reviewed. The patient's tolerance of                            previous anesthesia was also reviewed. The risks                            and benefits of the procedure and the sedation                            options and risks were discussed with the patient.                            All questions were answered, and informed consent                            was obtained. Prior Anticoagulants: The patient has                            taken no anticoagulant or antiplatelet agents. ASA                            Grade Assessment: II - A patient with mild systemic                            disease. After reviewing the risks and benefits,                            the patient was deemed in satisfactory condition to  undergo the procedure.                           After obtaining informed consent, the colonoscope                            was passed under direct vision. Throughout the                            procedure, the patient's blood pressure, pulse, and                            oxygen saturations were monitored continuously. The                            Olympus Scope [id number] was introduced  through the                            anus and advanced to the the cecum, identified by                            appendiceal orifice and ileocecal valve. The                            colonoscopy was performed without difficulty. The                            patient tolerated the procedure well. The quality                            of the bowel preparation was adequate. The bowel                            preparation used was SUPREP via split dose                            instruction. The ileocecal valve, appendiceal                            orifice, and rectum were photographed. Scope In: 9:20:29 AM Scope Out: 9:40:34 AM Scope Withdrawal Time: 0 hours 18 minutes 5 seconds  Total Procedure Duration: 0 hours 20 minutes 5 seconds  Findings:                 The perianal and digital rectal examinations were                            normal except for a small anal tag/old hemorrhoid.                           Three sessile polyps were found in the sigmoid                            colon, descending colon and ascending colon. The  polyps were 4 to 12 mm in size. These polyps were                            removed with a cold snare. Resection and retrieval                            were complete. Verification of patient                            identification for the specimen was done. Estimated                            blood loss was minimal.                           Multiple diverticula were found in the sigmoid                            colon, descending colon and ascending colon.                           The exam was otherwise without abnormality on                            direct and retroflexion views. Complications:            No immediate complications. Estimated Blood Loss:     Estimated blood loss was minimal. Impression:               - Three 4 to 12 mm polyps in the sigmoid colon, in                            the descending colon  and in the ascending colon,                            removed with a cold snare. Resected and retrieved.                           - Diverticulosis in the sigmoid colon, in the                            descending colon and in the ascending colon.                           - The examination was otherwise normal on direct                            and retroflexion views.                           - Personal history of colonic polyps. 3 diminutive                            adenomas in 2020 Recommendation:           -  Patient has a contact number available for                            emergencies. The signs and symptoms of potential                            delayed complications were discussed with the                            patient. Return to normal activities tomorrow.                            Written discharge instructions were provided to the                            patient.                           - Resume previous diet.                           - Continue present medications.                           - Repeat colonoscopy is recommended for                            surveillance. The colonoscopy date will be                            determined after pathology results from today's                            exam become available for review. Lupita FORBES Commander, MD 01/20/2024 9:48:48 AM This report has been signed electronically.

## 2024-01-21 ENCOUNTER — Telehealth: Payer: Self-pay | Admitting: *Deleted

## 2024-01-21 NOTE — Telephone Encounter (Signed)
 Attempted post procedure follow up call.  No answer - LVM.

## 2024-01-22 LAB — SURGICAL PATHOLOGY

## 2024-01-25 ENCOUNTER — Ambulatory Visit: Payer: Self-pay | Admitting: Internal Medicine

## 2024-01-27 ENCOUNTER — Encounter: Payer: Self-pay | Admitting: Internal Medicine

## 2024-01-27 ENCOUNTER — Encounter: Payer: Self-pay | Admitting: Nurse Practitioner

## 2024-01-27 DIAGNOSIS — G8929 Other chronic pain: Secondary | ICD-10-CM

## 2024-01-28 NOTE — Addendum Note (Signed)
 Addended by: Lavaeh Bau A on: 01/28/2024 12:25 PM   Modules accepted: Orders

## 2024-02-10 ENCOUNTER — Ambulatory Visit: Admitting: Orthopaedic Surgery

## 2024-02-18 ENCOUNTER — Encounter: Payer: Self-pay | Admitting: Nurse Practitioner

## 2024-02-18 DIAGNOSIS — Z8619 Personal history of other infectious and parasitic diseases: Secondary | ICD-10-CM

## 2024-02-18 DIAGNOSIS — E785 Hyperlipidemia, unspecified: Secondary | ICD-10-CM

## 2024-02-18 MED ORDER — VALACYCLOVIR HCL 1 G PO TABS: 1000.0000 mg | ORAL_TABLET | ORAL | 0 refills | Status: DC | PRN

## 2024-02-18 MED ORDER — ROSUVASTATIN CALCIUM 40 MG PO TABS: 40.0000 mg | ORAL_TABLET | Freq: Every day | ORAL | 3 refills | Status: DC

## 2024-02-18 NOTE — Telephone Encounter (Signed)
 Requesting: Rosuvastatin  request 90 day supply, valACYclovir  (VALTREX ) 1000 MG tablet  Last Visit: 12/23/2023 Next Visit: Visit date not found Last Refill: 11/21/2023 by Dr. Lynwood Schilling, 11/21/2023  Please Advise

## 2024-02-18 NOTE — Telephone Encounter (Signed)
 Copied from CRM [id number]. Topic: Clinical - Prescription Issue >> Feb 18, 2024  1:59 PM Suzen RAMAN wrote: Reason for CRM: Jereld from [facility] would like a call back pertaining to patient medication valACYclovir  (VALTREX ) 1000 MG tablet. Pharmacy would like to know what the medication frequency would be current script written as Take 1 tablet (1,000 mg total) by mouth as needed.  CB#[phone]

## 2024-02-18 NOTE — Addendum Note (Signed)
 Addended by: Yolande Skoda A on: 02/18/2024 03:53 PM   Modules accepted: Orders

## 2024-03-15 DIAGNOSIS — M7541 Impingement syndrome of right shoulder: Secondary | ICD-10-CM | POA: Diagnosis not present

## 2024-03-15 DIAGNOSIS — M25511 Pain in right shoulder: Secondary | ICD-10-CM | POA: Diagnosis not present

## 2024-04-09 ENCOUNTER — Other Ambulatory Visit: Payer: Self-pay | Admitting: Family

## 2024-04-09 ENCOUNTER — Encounter: Payer: Self-pay | Admitting: Nurse Practitioner

## 2024-04-09 MED ORDER — ALPRAZOLAM 0.25 MG PO TABS: 0.2500 mg | ORAL_TABLET | Freq: Every evening | ORAL | 0 refills | Status: DC | PRN

## 2024-04-09 NOTE — Telephone Encounter (Signed)
 Requesting: Alprazolam  0.25mg  Last Visit: 12/23/2023 Next Visit: Visit date not found Last Refill: 03/25/2023  Please Advise

## 2024-04-20 ENCOUNTER — Encounter: Payer: Self-pay | Admitting: Nurse Practitioner

## 2024-04-27 DIAGNOSIS — M25539 Pain in unspecified wrist: Secondary | ICD-10-CM | POA: Diagnosis not present

## 2024-04-27 DIAGNOSIS — M25529 Pain in unspecified elbow: Secondary | ICD-10-CM | POA: Diagnosis not present

## 2024-04-27 DIAGNOSIS — M25519 Pain in unspecified shoulder: Secondary | ICD-10-CM | POA: Diagnosis not present

## 2024-05-24 ENCOUNTER — Encounter: Payer: Self-pay | Admitting: Radiology

## 2024-05-24 ENCOUNTER — Encounter: Payer: Self-pay | Admitting: Nurse Practitioner

## 2024-05-24 DIAGNOSIS — E785 Hyperlipidemia, unspecified: Secondary | ICD-10-CM

## 2024-05-25 MED ORDER — ROSUVASTATIN CALCIUM 40 MG PO TABS: 40.0000 mg | ORAL_TABLET | Freq: Every day | ORAL | 0 refills | Status: DC

## 2024-05-25 NOTE — Telephone Encounter (Signed)
 Requesting: rosuvastatin  (CRESTOR ) 40 MG tablet (Expired)  Last Visit: 12/23/2023 Next Visit: Visit date not found Last Refill: 02/18/2024  Please Advise

## 2024-06-14 MED ORDER — ALPRAZOLAM 0.25 MG PO TABS: 0.2500 mg | ORAL_TABLET | Freq: Every evening | ORAL | 0 refills | Status: DC | PRN

## 2024-06-14 NOTE — Addendum Note (Signed)
 Addended by: GLADIS CLAUDENE GRATE Y on: 06/14/2024 10:42 AM   Modules accepted: Orders

## 2024-06-14 NOTE — Telephone Encounter (Signed)
 Requesting: Alprazolam  Last Visit: 12/23/2023 Next Visit: 07/19/2024 Last Refill: 04/09/2024  Please Advise

## 2024-07-04 ENCOUNTER — Emergency Department (HOSPITAL_BASED_OUTPATIENT_CLINIC_OR_DEPARTMENT_OTHER)

## 2024-07-04 ENCOUNTER — Other Ambulatory Visit: Payer: Self-pay

## 2024-07-04 ENCOUNTER — Emergency Department (HOSPITAL_BASED_OUTPATIENT_CLINIC_OR_DEPARTMENT_OTHER)
Admission: EM | Admit: 2024-07-04 | Discharge: 2024-07-04 | Disposition: A | Discharge status: Home / Self Care | Attending: Emergency Medicine | Admitting: Emergency Medicine

## 2024-07-04 DIAGNOSIS — Z7982 Long term (current) use of aspirin: Secondary | ICD-10-CM | POA: Diagnosis not present

## 2024-07-04 DIAGNOSIS — R200 Anesthesia of skin: Secondary | ICD-10-CM | POA: Insufficient documentation

## 2024-07-04 DIAGNOSIS — R202 Paresthesia of skin: Secondary | ICD-10-CM | POA: Diagnosis present

## 2024-07-04 LAB — CBC WITH DIFFERENTIAL/PLATELET
Abs Immature Granulocytes: 0.01 K/uL (ref 0.00–0.07)
Basophils Absolute: 0 K/uL (ref 0.0–0.1)
Basophils Relative: 1 %
Eosinophils Absolute: 0.2 K/uL (ref 0.0–0.5)
Eosinophils Relative: 3 %
HCT: 42.4 % (ref 36.0–46.0)
Hemoglobin: 14.6 g/dL (ref 12.0–15.0)
Immature Granulocytes: 0 %
Lymphocytes Relative: 29 %
Lymphs Abs: 2.2 K/uL (ref 0.7–4.0)
MCH: 34 pg (ref 26.0–34.0)
MCHC: 34.4 g/dL (ref 30.0–36.0)
MCV: 98.6 fL (ref 80.0–100.0)
Monocytes Absolute: 0.6 K/uL (ref 0.1–1.0)
Monocytes Relative: 8 %
Neutro Abs: 4.4 K/uL (ref 1.7–7.7)
Neutrophils Relative %: 59 %
Platelets: 245 K/uL (ref 150–400)
RBC: 4.3 MIL/uL (ref 3.87–5.11)
RDW: 12.2 % (ref 11.5–15.5)
WBC: 7.3 K/uL (ref 4.0–10.5)
nRBC: 0 % (ref 0.0–0.2)

## 2024-07-04 LAB — BASIC METABOLIC PANEL WITH GFR
Anion gap: 12 (ref 5–15)
BUN: 17 mg/dL (ref 8–23)
CO2: 26 mmol/L (ref 22–32)
Calcium: 9.8 mg/dL (ref 8.9–10.3)
Chloride: 103 mmol/L (ref 98–111)
Creatinine, Ser: 0.73 mg/dL (ref 0.44–1.00)
GFR, Estimated: >60 mL/min (ref >60)
Glucose, Bld: 134 mg/dL — ABNORMAL HIGH (ref 70–99)
Potassium: 3.9 mmol/L (ref 3.5–5.1)
Sodium: 141 mmol/L (ref 135–145)

## 2024-07-04 LAB — TROPONIN T, HIGH SENSITIVITY
Troponin T High Sensitivity: <15 ng/L (ref 0–19)
Troponin T High Sensitivity: <15 ng/L (ref 0–19)

## 2024-07-04 MED ORDER — FLUORESCEIN SODIUM 1 MG OP STRP
1.0000 | ORAL_STRIP | Freq: Once | OPHTHALMIC | Status: DC
  Filled 2024-07-04: qty 1

## 2024-07-04 MED ORDER — TETRACAINE HCL 0.5 % OP SOLN
2.0000 [drp] | Freq: Once | OPHTHALMIC | Status: DC
  Filled 2024-07-04: qty 4

## 2024-07-04 NOTE — ED Notes (Signed)
 Call to Lab to add orders to blood previously sent.

## 2024-07-04 NOTE — ED Triage Notes (Signed)
 Reports intermittent left hand tingling that has resolved. States had episode of left hand and left foot tingling on Tuesday.  Resolved after 30 mins. Symptoms returned today around 1400 but completely resolved at this time.

## 2024-07-04 NOTE — ED Provider Notes (Incomplete)
  EMERGENCY DEPARTMENT AT [facility] Provider Note   CSN: [id number] Arrival date & time: 07/04/24  1600     Patient presents with: Tingling   Patricia Hampton is a 73 y.o. female past medical history of hyperlipidemia and a family history of stroke and heart attack who presents emergency department with chief complaint of numbness and tingling.  Patient reports that last Tuesday she developed numbness and tingling in her left hand that spread to her mid forearm and numbness and tingling in her left foot followed by onset of a global headache and difficulty focusing her eyes.  She states that she took some ibuprofen and applied some pressure to her head which sometimes helps with headaches and fell asleep and all of her symptoms resolved.  She has a past medical history of headaches but has not had 1 in last 2 years and  {Add pertinent medical, surgical, social history, OB history to HPI:32947} HPI     Prior to Admission medications  Medication Sig Start Date End Date Taking? Authorizing Provider  acyclovir  ointment (ZOVIRAX ) 5 % Apply 1 Application topically every 3 (three) hours. Patient not taking: Reported on 12/31/2023 09/08/23   Nedra Tinnie LABOR, NP  ALPRAZolam  (XANAX ) 0.25 MG tablet Take 1 tablet (0.25 mg total) by mouth at bedtime as needed for anxiety. 06/14/24   McElwee, Lauren A, NP  aspirin  EC 81 MG tablet Take 1 tablet (81 mg total) by mouth daily. 12/07/18   Jenetta Holmes HERO, MD  Calcium  Carbonate-Vitamin D  600-400 MG-UNIT tablet Take 1 tablet by mouth 2 (two) times daily. 05/21/18   Aron, Talia M, MD  cholecalciferol  (VITAMIN D3) 25 MCG (1000 UNIT) tablet Take 5,000 Units by mouth daily.    [provider]  EPINEPHrine  (EPIPEN  2-PAK) 0.3 mg/0.3 mL IJ SOAJ injection Inject 0.3 mg into the muscle as needed for anaphylaxis. Patient not taking: Reported on 01/20/2024 07/22/23   Nedra Tinnie LABOR, NP  Omega-3 Fatty Acids (FISH OIL ) 1000 MG CAPS Take 2qd  05/21/18   Aron, Talia M, MD  rosuvastatin  (CRESTOR ) 40 MG tablet Take 1 tablet (40 mg total) by mouth daily. 05/25/24 08/23/24  McElwee, Lauren A, NP  tretinoin  (RETIN-A ) 0.025 % cream Apply topically at bedtime. 10/11/21   Nche, Roselie Rockford, NP  valACYclovir  (VALTREX ) 1000 MG tablet Take 1 tablet (1,000 mg total) by mouth as needed. Take 1 tablet daily for 5 days as needed for HSV outbreak 02/18/24   McElwee, Tinnie LABOR, NP    Allergies: Bee venom    Review of Systems  Updated Vital Signs BP (!) 131/90 (BP Location: Right Arm)   Pulse 80   Temp 97.9 F (36.6 C) (Oral)   Resp 18   SpO2 99%   Physical Exam  (all labs ordered are listed, but only abnormal results are displayed) Labs Reviewed - No data to display  EKG: None  Radiology: No results found.  {Document cardiac monitor, telemetry assessment procedure when appropriate:32947} Procedures   Medications Ordered in the ED - No data to display    {Click here for ABCD2, HEART and other calculators REFRESH Note before signing:1}                              Medical Decision Making Amount and/or Complexity of Data Reviewed Labs: ordered. Radiology: ordered.   ***  {Document critical care time when appropriate  Document review of labs and clinical decision tools  ie CHADS2VASC2, etc  Document your independent review of radiology images and any outside records  Document your discussion with family members, caretakers and with consultants  Document social determinants of health affecting pt's care  Document your decision making why or why not admission, treatments were needed:32947:::1}   Final diagnoses:  None    ED Discharge Orders     None

## 2024-07-04 NOTE — ED Notes (Signed)
 Call to lab to add trop to blood previously drawn

## 2024-07-04 NOTE — ED Provider Notes (Signed)
°  Physical Exam  BP (!) 131/90 (BP Location: Right Arm)   Pulse 80   Temp 97.9 F (36.6 C) (Oral)   Resp 18   SpO2 99%   Physical Exam  Procedures  Procedures  ED Course / MDM    Medical Decision Making Amount and/or Complexity of Data Reviewed Labs: ordered. Radiology: ordered. ECG/medicine tests: ordered.   N/t in left hand and foot, extended to left f/a.  Difficult focusing vision.  Spontaneous resolution 1 week prior.  Recurrence of heft hand n/t that extended to elbow, resolved PTA.  Hx of h/a w/o migraine.  Plan to consult to neuro.  Asymptomatic at present.  Reassessed patient, troponins do not show any elevation and EKG is unremarkable.  Given that she has had no recurrence of symptoms and had relatively low risk symptoms, plan at this time is to follow-up with neurology and ambulatory referral for the same has been placed.  She has a normal neurologic evaluation and reexamination and states that she has no change in her condition since initial presentation and evaluation.  Given this we will discharge with outpatient follow-up as previously discussed.      Myriam Dorn BROCKS, [place] 07/04/24 2136    Jerrol Agent, MD 07/05/24 [phone]

## 2024-07-04 NOTE — ED Notes (Signed)
 Patient transported to CT

## 2024-07-19 ENCOUNTER — Encounter: Admitting: Nurse Practitioner

## 2024-07-19 NOTE — Progress Notes (Unsigned)
 "  NEUROLOGY FOLLOW UP OFFICE NOTE  Patricia Hampton [id number]  Assessment/Plan:   Migraine with aura, without status migrainosus, not intractable  As symptoms involved the contralateral side, would still rule out stroke or arterial stenosis - MRI of brain and MRA head and neck As her spells are infrequent, would not start a migraine preventative. If she should have another episode, to treat the headache, will have her try samples of Ubrelvy.  Triptans are contraindicated as her migraines present with stroke-like symptoms/hemiplegic migraine. Follow up 6 months.  Subjective:  Patricia Hampton is a 73 year old female with HLD whom I previously saw for  headache presents for numbness and tingling.  History supplemented by ED note.  CT personally reviewed.  Discussed the use of AI scribe software for clinical note transcription with the patient, who gave verbal consent to proceed.  History of Present Illness Patricia Hampton is a 73 year old female with migraines who presents with recurrent episodes of left-sided numbness and headache.  She has a history of migraines since 2021, in which she will develop paresthesias of the right hand radiating up the arm and to the right side of her face lasting 10 minutes and followed by a bilateral occipital headache.  No associated nausea, vomiting, photophobia, phonophobia, visual disturbance.  No associated numbness or weakness of the arm.  Headache lasts a day.  She thinks stress may be a trigger as around that time, a family member had committed suicide.  Wrapping a tight band around her head helps.  She denies any neck pain.  It is not severe but uncomfortable enough to affect concentration.  She has tried NSAIDs, Fioricet, rizatriptan , muscle relaxants, and gabapentin  with little benefit.  In early December 2025, she had a similar headache but it involved the left arm and face, as well as some blurred vision, which was followed by a  headache.  Then on 12/14, she experienced another episode of left-sided numbness and visual disturbance without a headache. Her husband suggested she seek medical evaluation due to a family history of strokes and heart attacks.  No associated nausea, light sensitivity, or sound sensitivity during these episodes. She notes that stress, such as a recent move and family issues, may have contributed to her symptoms.  Seen in the ED on 12/14.  CT head showed chronic small vessel ischemic changes but no acute intracranial abnormality.  CT cervical spine showed mild degenerative changes at C5-C6 but otherwise unremarkable.  EKG and troponins unremarkable.     She had similar occipital headaches years ago (without the arm and back discomfort) which was triggered by stress.      PAST MEDICAL HISTORY: Past Medical History:  Diagnosis Date   Allergy    Anxiety    Cataract Nov 19, 2020   I had cataract surgery in my right eye. I have cataracts in my left eye, but it is not ready for surgery.   GERD (gastroesophageal reflux disease)    Hyperlipidemia    Osteopenia 02/19/2005   Urinary incontinence     MEDICATIONS: Medications Ordered Prior to Encounter[1]  ALLERGIES: Allergies[2]  FAMILY HISTORY: Family History  Problem Relation Age of Onset   Hyperlipidemia Mother    Heart attack Mother 56       Died of MI   Early death Mother    Stroke Father    Heart attack Father 38   Hypertension Sister    Depression Sister    Alcohol abuse  Sister    Stroke Sister    Early death Sister    Obesity Sister    Migraines Sister    Hyperlipidemia Brother    Cancer Brother        ESOPHAGEAL   Esophageal cancer Brother    Early death Brother    Diabetes Brother    Hypertension Brother    Depression Brother    Hyperlipidemia Brother    Cancer Maternal Grandmother        breast   Breast cancer Maternal Grandmother    Diabetes Paternal Grandfather    Colon cancer Neg Hx    Colon polyps Neg Hx     Rectal cancer Neg Hx    Stomach cancer Neg Hx       Objective:  Blood pressure 138/68, pulse 79, height 5' (1.524 m), weight 123 lb 3.2 oz (55.9 kg), SpO2 98%. General: No acute distress.  Patient appears well-groomed.   Head:  Normocephalic/atraumatic Eyes:  Fundi examined but not visualized Neck: supple, no paraspinal tenderness, full range of motion Heart:  Regular rate and rhythm Neurological Exam: alert and oriented.  Speech fluent and not dysarthric, language intact.  CN II-XII intact. Bulk and tone normal, muscle strength 5/5 throughout.  Sensation to light touch intact.  Deep tendon reflexes 2+ throughout, toes downgoing.  Finger to nose testing intact.  Gait normal, Romberg negative.   Juliene Dunnings, DO  CC:  Dorn Dec, PA  Tinnie Harada, NP          [1]  Current Outpatient Medications on File Prior to Visit  Medication Sig Dispense Refill   acyclovir  ointment (ZOVIRAX ) 5 % Apply 1 Application topically every 3 (three) hours. (Patient not taking: Reported on 12/31/2023) 15 g 0   ALPRAZolam  (XANAX ) 0.25 MG tablet Take 1 tablet (0.25 mg total) by mouth at bedtime as needed for anxiety. 30 tablet 0   aspirin  EC 81 MG tablet Take 1 tablet (81 mg total) by mouth daily.     Calcium  Carbonate-Vitamin D  600-400 MG-UNIT tablet Take 1 tablet by mouth 2 (two) times daily. 180 tablet 3   cholecalciferol  (VITAMIN D3) 25 MCG (1000 UNIT) tablet Take 5,000 Units by mouth daily.     EPINEPHrine  (EPIPEN  2-PAK) 0.3 mg/0.3 mL IJ SOAJ injection Inject 0.3 mg into the muscle as needed for anaphylaxis. (Patient not taking: Reported on 01/20/2024) 2 each 1   Omega-3 Fatty Acids (FISH OIL ) 1000 MG CAPS Take 2qd 180 capsule 3   rosuvastatin  (CRESTOR ) 40 MG tablet Take 1 tablet (40 mg total) by mouth daily. 90 tablet 0   tretinoin  (RETIN-A ) 0.025 % cream Apply topically at bedtime. 45 g 11   valACYclovir  (VALTREX ) 1000 MG tablet Take 1 tablet (1,000 mg total) by mouth as needed. Take 1 tablet  daily for 5 days as needed for HSV outbreak 10 tablet 0   No current facility-administered medications on file prior to visit.  [2]  Allergies Allergen Reactions   Bee Venom Hives   "

## 2024-07-20 ENCOUNTER — Encounter: Payer: Self-pay | Admitting: Neurology

## 2024-07-20 ENCOUNTER — Ambulatory Visit (INDEPENDENT_AMBULATORY_CARE_PROVIDER_SITE_OTHER): Payer: Self-pay | Admitting: Neurology

## 2024-07-20 VITALS — BP 138/68 | HR 79 | Ht 60.0 in | Wt 123.2 lb

## 2024-07-20 DIAGNOSIS — G43109 Migraine with aura, not intractable, without status migrainosus: Secondary | ICD-10-CM

## 2024-07-20 NOTE — Patient Instructions (Signed)
 I suspect migraine but will check MRI to rule out other possible causes MRI of brain without contrast MRA of head and neck  If you should get another episode of numbness, take Ubrelvy at earliest onset of the headache (not numbness).  May repeat after 2 hours.  Maximum 2 tablets in 24 hours.  If that is not effective, then next time, take at the earliest onset of the arm numbness.  Let me know if it works

## 2024-07-20 NOTE — Progress Notes (Signed)
 Medication Samples have been provided to the patient.  Drug name: holland       Strength: 100 mg        Qty: 2  LOT: [id number]  Exp.Date: 6/27/4/28  Dosing instructions: as needed  The patient has been instructed regarding the correct time, dose, and frequency of taking this medication, including desired effects and most common side effects.   Patricia Hampton 2:49 PM 07/20/2024

## 2024-08-02 ENCOUNTER — Encounter: Payer: Self-pay | Admitting: Nurse Practitioner

## 2024-08-02 DIAGNOSIS — R7301 Impaired fasting glucose: Secondary | ICD-10-CM

## 2024-08-02 DIAGNOSIS — E559 Vitamin D deficiency, unspecified: Secondary | ICD-10-CM

## 2024-08-02 DIAGNOSIS — E782 Mixed hyperlipidemia: Secondary | ICD-10-CM

## 2024-08-03 ENCOUNTER — Encounter: Payer: Self-pay | Admitting: Neurology

## 2024-08-03 NOTE — Telephone Encounter (Signed)
 Copied from CRM #[id number]. Topic: Clinical - Request for Lab/Test Order >> Aug 03, 2024  8:16 AM Eva FALCON wrote: Reason for CRM: Pt is scheduled for her physical for Thursday 1/15. She is requesting lab orders to be placed so she doesn't have to fast all day. She is wondering if she can walk in to any lab to have them done or once the order is placed she can only get them done at [place]? Please call once orders have been placed. [phone].

## 2024-08-04 ENCOUNTER — Other Ambulatory Visit: Payer: Self-pay | Admitting: Nurse Practitioner

## 2024-08-04 ENCOUNTER — Other Ambulatory Visit

## 2024-08-04 LAB — BASIC METABOLIC PANEL WITH GFR
Creatinine: 0.6 (ref 0.5–1.1)
Glucose: 95

## 2024-08-04 LAB — CBC AND DIFFERENTIAL
HCT: 44 (ref 36–46)
Hemoglobin: 14.9 (ref 12.0–16.0)
Platelets: 242 K/uL (ref 150–400)

## 2024-08-04 LAB — LIPID PANEL
Cholesterol: 175 (ref 0–200)
HDL: 97 — AB (ref 35–70)
LDL Cholesterol: 65
LDl/HDL Ratio: 1.8

## 2024-08-04 LAB — COMPREHENSIVE METABOLIC PANEL WITH GFR
Albumin: 4.6 (ref 3.5–5.0)
Globulin: 1.9
eGFR: 94

## 2024-08-04 LAB — CBC: RBC: 4.46 (ref 3.87–5.11)

## 2024-08-04 LAB — HEPATIC FUNCTION PANEL: Bilirubin, Total: 0.6

## 2024-08-04 NOTE — Telephone Encounter (Signed)
 Pt called, stating [facility] had not received the order. I verified the fax# and it is correct.

## 2024-08-04 NOTE — Telephone Encounter (Signed)
 Copied from CRM [id number]. Topic: Clinical - Lab/Test Results >> Aug 03, 2024  5:36 PM Alfonso ORN wrote: Reason for CRM: pt called to confirm if lab order was sent to requested [facility] . Please call to confirm

## 2024-08-04 NOTE — Telephone Encounter (Addendum)
 I called and spoke with patient and notified her that I wil fax orders over. She said that someone from our office said that they were faxed yesterday. Order faxed to [phone].

## 2024-08-05 ENCOUNTER — Ambulatory Visit: Payer: Self-pay | Admitting: Nurse Practitioner

## 2024-08-05 ENCOUNTER — Encounter: Payer: Self-pay | Admitting: Nurse Practitioner

## 2024-08-05 ENCOUNTER — Ambulatory Visit: Admitting: Nurse Practitioner

## 2024-08-05 VITALS — BP 112/70 | HR 71 | Temp 97.8°F

## 2024-08-05 DIAGNOSIS — Z0000 Encounter for general adult medical examination without abnormal findings: Secondary | ICD-10-CM

## 2024-08-05 DIAGNOSIS — R7303 Prediabetes: Secondary | ICD-10-CM | POA: Diagnosis not present

## 2024-08-05 DIAGNOSIS — E785 Hyperlipidemia, unspecified: Secondary | ICD-10-CM | POA: Diagnosis not present

## 2024-08-05 DIAGNOSIS — Z1231 Encounter for screening mammogram for malignant neoplasm of breast: Secondary | ICD-10-CM | POA: Diagnosis not present

## 2024-08-05 DIAGNOSIS — M8580 Other specified disorders of bone density and structure, unspecified site: Secondary | ICD-10-CM

## 2024-08-05 DIAGNOSIS — F419 Anxiety disorder, unspecified: Secondary | ICD-10-CM | POA: Diagnosis not present

## 2024-08-05 DIAGNOSIS — E559 Vitamin D deficiency, unspecified: Secondary | ICD-10-CM

## 2024-08-05 LAB — COMPREHENSIVE METABOLIC PANEL WITH GFR
ALT: 34 IU/L — ABNORMAL HIGH (ref 0–32)
AST: 25 IU/L (ref 0–40)
Albumin: 4.6 g/dL (ref 3.8–4.8)
Alkaline Phosphatase: 54 IU/L (ref 49–135)
BUN/Creatinine Ratio: 19 (ref 12–28)
BUN: 12 mg/dL (ref 8–27)
Bilirubin Total: 0.6 mg/dL (ref 0.0–1.2)
CO2: 23 mmol/L (ref 20–29)
Calcium: 9.2 mg/dL (ref 8.7–10.3)
Chloride: 101 mmol/L (ref 96–106)
Creatinine, Ser: 0.64 mg/dL (ref 0.57–1.00)
Globulin, Total: 1.9 g/dL (ref 1.5–4.5)
Glucose: 95 mg/dL (ref 70–99)
Potassium: 4.2 mmol/L (ref 3.5–5.2)
Sodium: 139 mmol/L (ref 134–144)
Total Protein: 6.5 g/dL (ref 6.0–8.5)
eGFR: 94 mL/min/1.73

## 2024-08-05 LAB — CBC WITH DIFFERENTIAL/PLATELET
Basophils Absolute: 0 x10E3/uL (ref 0.0–0.2)
Basos: 0 %
EOS (ABSOLUTE): 0.2 x10E3/uL (ref 0.0–0.4)
Eos: 2 %
Hematocrit: 43.5 % (ref 34.0–46.6)
Hemoglobin: 14.9 g/dL (ref 11.1–15.9)
Immature Grans (Abs): 0 x10E3/uL (ref 0.0–0.1)
Immature Granulocytes: 0 %
Lymphocytes Absolute: 1.8 x10E3/uL (ref 0.7–3.1)
Lymphs: 24 %
MCH: 33.4 pg — ABNORMAL HIGH (ref 26.6–33.0)
MCHC: 34.3 g/dL (ref 31.5–35.7)
MCV: 98 fL — ABNORMAL HIGH (ref 79–97)
Monocytes Absolute: 0.6 x10E3/uL (ref 0.1–0.9)
Monocytes: 7 %
Neutrophils Absolute: 4.9 x10E3/uL (ref 1.4–7.0)
Neutrophils: 67 %
Platelets: 242 x10E3/uL (ref 150–450)
RBC: 4.46 x10E6/uL (ref 3.77–5.28)
RDW: 12.1 % (ref 11.7–15.4)
WBC: 7.5 x10E3/uL (ref 3.4–10.8)

## 2024-08-05 LAB — LIPID PANEL
Chol/HDL Ratio: 1.8 ratio (ref 0.0–4.4)
Cholesterol, Total: 175 mg/dL (ref 100–199)
HDL: 97 mg/dL
LDL Chol Calc (NIH): 65 mg/dL (ref 0–99)
Triglycerides: 72 mg/dL (ref 0–149)
VLDL Cholesterol Cal: 13 mg/dL (ref 5–40)

## 2024-08-05 LAB — HEMOGLOBIN A1C
Est. average glucose Bld gHb Est-mCnc: 123 mg/dL
Hgb A1c MFr Bld: 5.9 % — ABNORMAL HIGH (ref 4.8–5.6)

## 2024-08-05 LAB — VITAMIN D 25 HYDROXY (VIT D DEFICIENCY, FRACTURES): Vit D, 25-Hydroxy: 67.7 ng/mL (ref 30.0–100.0)

## 2024-08-05 MED ORDER — ALPRAZOLAM 0.25 MG PO TABS: 0.2500 mg | ORAL_TABLET | Freq: Every evening | ORAL | 1 refills | Status: AC | PRN

## 2024-08-05 MED ORDER — ROSUVASTATIN CALCIUM 40 MG PO TABS: 40.0000 mg | ORAL_TABLET | Freq: Every day | ORAL | 3 refills | Status: AC

## 2024-08-05 NOTE — Patient Instructions (Signed)
 It was great to see you!  We will add on a B12 level to your labs from yesterday   Start a B12 supplement 500mcg daily   Keep up the great work with exercise and eating healthy   Let's follow-up in 1 year, sooner if you have concerns.  If a referral was placed today, you will be contacted for an appointment. Please note that routine referrals can sometimes take up to 3-4 weeks to process. Please call our office if you haven't heard anything after this time frame.  Take care,  Tinnie Harada, NP

## 2024-08-05 NOTE — Assessment & Plan Note (Addendum)
 Chronic, stable. Dexa-scan in June 2022 revealed a T score of -1.9. Continue to take Calcium  Carbonate -Vitamin D  600/400 mg daily. Dexa-Scan Due 2027

## 2024-08-05 NOTE — Progress Notes (Signed)
 "  BP 112/70 (BP Location: Left Arm, Patient Position: Sitting, Cuff Size: Normal)   Pulse 71   Temp 97.8 F (36.6 C)   SpO2 97%    Subjective:    Patient ID: Patricia Hampton, female    DOB: 11-Feb-1951, 74 y.o.   MRN: [id number]  CC: Chief Complaint  Patient presents with   Annual Exam    No concerns and go over recent labs    HPI: Patricia Hampton is a 74 y.o. female presenting on 08/05/2024 for comprehensive medical examination. Current medical complaints include:none  She currently lives with: husband Menopausal Symptoms: no  Depression and Anxiety Screen done today and results listed below:     08/05/2024    2:24 PM 10/24/2023   10:49 AM 09/29/2023   11:41 AM 03/12/2023    9:57 AM 09/27/2022    3:32 PM  Depression screen PHQ 2/9  Decreased Interest 0 0 0 0 0  Down, Depressed, Hopeless 0 0 0 1 0  PHQ - 2 Score 0 0 0 1 0  Altered sleeping 1 1 1 3    Tired, decreased energy 0 0 0 0   Change in appetite 0 0 0 0   Feeling bad or failure about yourself  0 0 0 0   Trouble concentrating 0 0 0 0   Moving slowly or fidgety/restless 0 0 0 0   Suicidal thoughts 0 0 0 0   PHQ-9 Score 1 1  1  4     Difficult doing work/chores Not difficult at all Not difficult at all Not difficult at all       Data saved with a previous flowsheet row definition      08/05/2024    2:24 PM 10/24/2023   10:49 AM 03/12/2023    9:58 AM 09/11/2022    9:49 AM  GAD 7 : Generalized Anxiety Score  Nervous, Anxious, on Edge 1 0 1 1  Control/stop worrying 0 1 0 0  Worry too much - different things 0 1 0 0  Trouble relaxing 0 0 0 0  Restless 0 0 0 0  Easily annoyed or irritable 0 0 0 0  Afraid - awful might happen 0 0 0 0  Total GAD 7 Score 1 2 1 1   Anxiety Difficulty Not difficult at all Not difficult at all Not difficult at all Not difficult at all    The patient does not have a history of falls. I did not complete a risk assessment for falls. A plan of care for falls was not documented.   Past  Medical History:  Past Medical History:  Diagnosis Date   Allergy    Anxiety    Cataract Nov 19, 2020   I had cataract surgery in my right eye. I have cataracts in my left eye, but it is not ready for surgery.   GERD (gastroesophageal reflux disease)    Hyperlipidemia    Osteopenia 02/19/2005   Urinary incontinence     Surgical History:  Past Surgical History:  Procedure Laterality Date   bladder tack     BREAST EXCISIONAL BIOPSY Left    CATARACT EXTRACTION Left 05/14/2023   CESAREAN SECTION     COLONOSCOPY  08/2006   EYE SURGERY  Nov 23, 2021   Right eye cataract   REDUCTION MAMMAPLASTY Bilateral 05/13/2017   REPEAT CESAREAN SECTION      Medications:  Current Outpatient Medications on File Prior to Visit  Medication Sig   aspirin  EC  81 MG tablet Take 1 tablet (81 mg total) by mouth daily.   Calcium  Carbonate-Vitamin D  600-400 MG-UNIT tablet Take 1 tablet by mouth 2 (two) times daily.   cholecalciferol  (VITAMIN D3) 25 MCG (1000 UNIT) tablet Take 5,000 Units by mouth daily.   EPINEPHrine  (EPIPEN  2-PAK) 0.3 mg/0.3 mL IJ SOAJ injection Inject 0.3 mg into the muscle as needed for anaphylaxis.   Omega-3 Fatty Acids (FISH OIL ) 1000 MG CAPS Take 2qd   tretinoin  (RETIN-A ) 0.025 % cream Apply topically at bedtime.   valACYclovir  (VALTREX ) 1000 MG tablet Take 1 tablet (1,000 mg total) by mouth as needed. Take 1 tablet daily for 5 days as needed for HSV outbreak   No current facility-administered medications on file prior to visit.    Allergies:  Allergies[1]  Social History:  Social History   Socioeconomic History   Marital status: Married    Spouse name: Not on file   Number of children: 2   Years of education: Not on file   Highest education level: Bachelor's degree (e.g., BA, AB, BS)  Occupational History    Employer: [employer]  Tobacco Use   Smoking status: Never   Smokeless tobacco: Never  Vaping Use   Vaping status: Never Used  Substance and Sexual  Activity   Alcohol use: Yes    Alcohol/week: 4.0 standard drinks of alcohol    Types: 4 Glasses of wine per week    Comment: 8-10 glasses wine per week per pt   Drug use: Never   Sexual activity: Yes    Birth control/protection: Post-menopausal  Other Topics Concern   Not on file  Social History Narrative   Are you right handed or left handed? Right    Are you currently employed ? Retired   What is your current occupation?   Do you live at home alone? NO   Who lives with you? Husband   What type of home do you live in: 1 story or 2 story? 2       Social Drivers of Health   Tobacco Use: Low Risk (08/05/2024)   Patient History    Smoking Tobacco Use: Never    Smokeless Tobacco Use: Never    Passive Exposure: Not on file  Financial Resource Strain: Low Risk (08/04/2024)   Overall Financial Resource Strain (CARDIA)    Difficulty of Paying Living Expenses: Not hard at all  Food Insecurity: No Food Insecurity (08/04/2024)   Epic    Worried About Programme Researcher, Broadcasting/film/video in the Last Year: Never true    Ran Out of Food in the Last Year: Never true  Transportation Needs: No Transportation Needs (08/04/2024)   Epic    Lack of Transportation (Medical): No    Lack of Transportation (Non-Medical): No  Physical Activity: Sufficiently Active (08/04/2024)   Exercise Vital Sign    Days of Exercise per Week: 5 days    Minutes of Exercise per Session: 60 min  Stress: No Stress Concern Present (08/04/2024)   [employer] of Occupational Health - Occupational Stress Questionnaire    Feeling of Stress: Only a little  Social Connections: Socially Integrated (08/04/2024)   Social Connection and Isolation Panel    Frequency of Communication with Friends and Family: More than three times a week    Frequency of Social Gatherings with Friends and Family: Once a week    Attends Religious Services: More than 4 times per year    Active Member of [employer] or Organizations: Yes  Attends Banker  Meetings: More than 4 times per year    Marital Status: Married  Catering Manager Violence: Not At Risk (09/29/2023)   Humiliation, Afraid, Rape, and Kick questionnaire    Fear of Current or Ex-Partner: No    Emotionally Abused: No    Physically Abused: No    Sexually Abused: No  Depression (PHQ2-9): Low Risk (08/05/2024)   Depression (PHQ2-9)    PHQ-2 Score: 1  Alcohol Screen: Low Risk (08/04/2024)   Alcohol Screen    Last Alcohol Screening Score (AUDIT): 3  Housing: Low Risk (08/04/2024)   Epic    Unable to Pay for Housing in the Last Year: No    Number of Times Moved in the Last Year: 1    Homeless in the Last Year: No  Utilities: Not At Risk (09/29/2023)   [facility] Utilities    Threatened with loss of utilities: No  Health Literacy: Adequate Health Literacy (09/29/2023)   B1300 Health Literacy    Frequency of need for help with medical instructions: Never   Tobacco Use History[2] Social History   Substance and Sexual Activity  Alcohol Use Yes   Alcohol/week: 4.0 standard drinks of alcohol   Types: 4 Glasses of wine per week   Comment: 8-10 glasses wine per week per pt    Family History:  Family History  Problem Relation Age of Onset   Hyperlipidemia Mother    Heart attack Mother 18       Died of MI   Early death Mother    Stroke Father    Heart attack Father 55   Hyperlipidemia Father    Hypertension Sister    Depression Sister    Alcohol abuse Sister    Stroke Sister    Early death Sister    Obesity Sister    Migraines Sister    Hyperlipidemia Brother    Cancer Brother        ESOPHAGEAL   Esophageal cancer Brother    Early death Brother    Diabetes Brother    Hypertension Brother    Depression Brother    Hyperlipidemia Brother    Cancer Maternal Grandmother        breast   Breast cancer Maternal Grandmother    Diabetes Paternal Grandfather    Colon cancer Neg Hx    Colon polyps Neg Hx    Rectal cancer Neg Hx    Stomach cancer Neg Hx     Past medical  history, surgical history, medications, allergies, family history and social history reviewed with patient today and changes made to appropriate areas of the chart.   Review of Systems  Constitutional: Negative.   HENT: Negative.    Eyes: Negative.   Respiratory: Negative.    Cardiovascular: Negative.   Gastrointestinal: Negative.   Genitourinary: Negative.   Musculoskeletal: Negative.   Skin: Negative.   Neurological: Negative.   Psychiatric/Behavioral: Negative.     All other ROS negative except what is listed above and in the HPI.      Objective:    BP 112/70 (BP Location: Left Arm, Patient Position: Sitting, Cuff Size: Normal)   Pulse 71   Temp 97.8 F (36.6 C)   SpO2 97%   Wt Readings from Last 3 Encounters:  07/20/24 123 lb 3.2 oz (55.9 kg)  01/20/24 123 lb (55.8 kg)  12/31/23 123 lb (55.8 kg)    Physical Exam Vitals and nursing note reviewed.  Constitutional:      General: She  is not in acute distress.    Appearance: Normal appearance.  HENT:     Head: Normocephalic and atraumatic.     Right Ear: Tympanic membrane, ear canal and external ear normal.     Left Ear: Tympanic membrane, ear canal and external ear normal.     Mouth/Throat:     Mouth: Mucous membranes are moist.     Pharynx: No posterior oropharyngeal erythema.  Eyes:     Conjunctiva/sclera: Conjunctivae normal.  Cardiovascular:     Rate and Rhythm: Normal rate and regular rhythm.     Pulses: Normal pulses.     Heart sounds: Normal heart sounds.  Pulmonary:     Effort: Pulmonary effort is normal.     Breath sounds: Normal breath sounds.  Abdominal:     Palpations: Abdomen is soft.     Tenderness: There is no abdominal tenderness.  Musculoskeletal:        General: Normal range of motion.     Cervical back: Normal range of motion and neck supple.     Right lower leg: No edema.     Left lower leg: No edema.  Lymphadenopathy:     Cervical: No cervical adenopathy.  Skin:    General: Skin is  warm and dry.  Neurological:     General: No focal deficit present.     Mental Status: She is alert and oriented to person, place, and time.     Cranial Nerves: No cranial nerve deficit.     Coordination: Coordination normal.     Gait: Gait normal.  Psychiatric:        Mood and Affect: Mood normal.        Behavior: Behavior normal.        Thought Content: Thought content normal.        Judgment: Judgment normal.     Results for orders placed or performed in visit on 08/04/24  CBC with Differential/Platelet   Collection Time: 08/04/24 10:17 AM  Result Value Ref Range   WBC 7.5 3.4 - 10.8 x10E3/uL   RBC 4.46 3.77 - 5.28 x10E6/uL   Hemoglobin 14.9 11.1 - 15.9 g/dL   Hematocrit 56.4 65.9 - 46.6 %   MCV 98 (H) 79 - 97 fL   MCH 33.4 (H) 26.6 - 33.0 pg   MCHC 34.3 31.5 - 35.7 g/dL   RDW 87.8 88.2 - 84.5 %   Platelets 242 150 - 450 x10E3/uL   Neutrophils 67 Not Estab. %   Lymphs 24 Not Estab. %   Monocytes 7 Not Estab. %   Eos 2 Not Estab. %   Basos 0 Not Estab. %   Neutrophils Absolute 4.9 1.4 - 7.0 x10E3/uL   Lymphocytes Absolute 1.8 0.7 - 3.1 x10E3/uL   Monocytes Absolute 0.6 0.1 - 0.9 x10E3/uL   EOS (ABSOLUTE) 0.2 0.0 - 0.4 x10E3/uL   Basophils Absolute 0.0 0.0 - 0.2 x10E3/uL   Immature Granulocytes 0 Not Estab. %   Immature Grans (Abs) 0.0 0.0 - 0.1 x10E3/uL  Comprehensive metabolic panel with GFR   Collection Time: 08/04/24 10:17 AM  Result Value Ref Range   Glucose 95 70 - 99 mg/dL   BUN 12 8 - 27 mg/dL   Creatinine, Ser 9.35 0.57 - 1.00 mg/dL   eGFR 94 >40 fO/fpw/8.26   BUN/Creatinine Ratio 19 12 - 28   Sodium 139 134 - 144 mmol/L   Potassium 4.2 3.5 - 5.2 mmol/L   Chloride 101 96 - 106 mmol/L  CO2 23 20 - 29 mmol/L   Calcium  9.2 8.7 - 10.3 mg/dL   Total Protein 6.5 6.0 - 8.5 g/dL   Albumin 4.6 3.8 - 4.8 g/dL   Globulin, Total 1.9 1.5 - 4.5 g/dL   Bilirubin Total 0.6 0.0 - 1.2 mg/dL   Alkaline Phosphatase 54 49 - 135 IU/L   AST 25 0 - 40 IU/L   ALT 34 (H) 0  - 32 IU/L  Lipid panel   Collection Time: 08/04/24 10:17 AM  Result Value Ref Range   Cholesterol, Total 175 100 - 199 mg/dL   Triglycerides 72 0 - 149 mg/dL   HDL 97 >60 mg/dL   VLDL Cholesterol Cal 13 5 - 40 mg/dL   LDL Chol Calc (NIH) 65 0 - 99 mg/dL   Chol/HDL Ratio 1.8 0.0 - 4.4 ratio  Hemoglobin A1c   Collection Time: 08/04/24 10:17 AM  Result Value Ref Range   Hgb A1c MFr Bld 5.9 (H) 4.8 - 5.6 %   Est. average glucose Bld gHb Est-mCnc 123 mg/dL  VITAMIN D  25 Hydroxy (Vit-D Deficiency, Fractures)   Collection Time: 08/04/24 10:17 AM  Result Value Ref Range   Vit D, 25-Hydroxy 67.7 30.0 - 100.0 ng/mL  Vitamin B12   Collection Time: 08/04/24 10:17 AM  Result Value Ref Range   Vitamin B-12 404 232 - 1,245 pg/mL  Specimen status report   Collection Time: 08/04/24 10:17 AM  Result Value Ref Range   specimen status report Comment       Assessment & Plan:   Problem List Items Addressed This Visit       Musculoskeletal and Integument   Osteopenia   Chronic, stable. Dexa-scan in June 2022 revealed a T score of -1.9. Continue to take Calcium  Carbonate -Vitamin D  600/400 mg daily. Dexa-Scan Due 2027        Other   HLD (hyperlipidemia)   Chronic, stable. Continue Rosuvastatin  40mg . Recent cholesterol well controlled.       Relevant Medications   rosuvastatin  (CRESTOR ) 40 MG tablet   Anxiety   Chronic, stable. Continue xanax  0.25mg  daily as needed. She rarely uses this.       Relevant Medications   ALPRAZolam  (XANAX ) 0.25 MG tablet   Vitamin D  insufficiency   She takes 5000 units daily. Vitamin D  normal. Continue daily supplement.       Routine general medical examination at a health care facility - Primary   Health maintenance reviewed and updated. Discussed nutrition, exercise. Follow-up 1 year.        Prediabetes   Chronic, stable. Most recent A1c 5.9%. Continue focus on healthy eating and exercise.       Other Visit Diagnoses       Encounter for  screening mammogram for malignant neoplasm of breast       mammogram ordered today.   Relevant Orders   MM 3D SCREENING MAMMOGRAM BILATERAL BREAST        Follow up plan: Return in about 1 year (around 08/05/2025) for CPE.   LABORATORY TESTING:  - Pap smear: not applicable  IMMUNIZATIONS:   - Tdap: Tetanus vaccination status reviewed: last tetanus booster within 10 years. - Influenza: Up to date - Pneumovax: Up to date - Prevnar: Up to date - HPV: Not applicable - Shingrix vaccine: Up to date  SCREENING: -Mammogram: Up to date  - Colonoscopy: Up to date  - Bone Density: up to date   PATIENT COUNSELING:   Advised to take 1 mg  of folate supplement per day if capable of pregnancy.   Sexuality: Discussed sexually transmitted diseases, partner selection, use of condoms, avoidance of unintended pregnancy  and contraceptive alternatives.   Advised to avoid cigarette smoking.  I discussed with the patient that most people either abstain from alcohol or drink within safe limits (<=14/week and <=4 drinks/occasion for males, <=7/weeks and <= 3 drinks/occasion for females) and that the risk for alcohol disorders and other health effects rises proportionally with the number of drinks per week and how often a drinker exceeds daily limits.  Discussed cessation/primary prevention of drug use and availability of treatment for abuse.   Diet: Encouraged to adjust caloric intake to maintain  or achieve ideal body weight, to reduce intake of dietary saturated fat and total fat, to limit sodium intake by avoiding high sodium foods and not adding table salt, and to maintain adequate dietary potassium and calcium  preferably from fresh fruits, vegetables, and low-fat dairy products.    stressed the importance of regular exercise  Injury prevention: Discussed safety belts, safety helmets, smoke detector, smoking near bedding or upholstery.   Dental health: Discussed importance of regular tooth  brushing, flossing, and dental visits.    NEXT PREVENTATIVE PHYSICAL DUE IN 1 YEAR. Return in about 1 year (around 08/05/2025) for CPE.  Tinnie DELENA Harada, NP  I,Emily Lagle,acting as a scribe for [employer], NP.,have documented all relevant documentation on the behalf of Essa Wenk DELENA Harada, NP.  I, Tinnie DELENA Harada, NP, have reviewed all documentation for this visit. The documentation on 08/05/2024 for the exam, diagnosis, procedures, and orders are all accurate and complete.     [1]  Allergies Allergen Reactions   Bee Venom Hives  [2]  Social History Tobacco Use  Smoking Status Never  Smokeless Tobacco Never   "

## 2024-08-05 NOTE — Telephone Encounter (Signed)
 Left message for patient to return call.

## 2024-08-05 NOTE — Assessment & Plan Note (Addendum)
 Health maintenance reviewed and updated. Discussed nutrition, exercise. Follow-up 1 year.

## 2024-08-05 NOTE — Assessment & Plan Note (Addendum)
 Chronic, stable. Continue Rosuvastatin  40mg . Recent cholesterol well controlled.

## 2024-08-05 NOTE — Assessment & Plan Note (Addendum)
 She takes 5000 units daily. Vitamin D  normal. Continue daily supplement.

## 2024-08-05 NOTE — Telephone Encounter (Unsigned)
 Copied from CRM #[id number]. Topic: Clinical - Request for Lab/Test Order >> Aug 05, 2024 10:27 AM Viola FALCON wrote: Reason for CRM: Patient returned Brittanys call - she said [facility] has received orders.

## 2024-08-05 NOTE — Assessment & Plan Note (Addendum)
 Chronic, stable. Continue xanax  0.25mg  daily as needed. She rarely uses this.

## 2024-08-05 NOTE — Assessment & Plan Note (Deleted)
Patient states she is unable to sleep at night. She shares she tried melatonin however it was not effective. Prescribed her Doxepin HCL 3 mg tablet to take one tablet daily at bedtime.

## 2024-08-06 DIAGNOSIS — R7303 Prediabetes: Secondary | ICD-10-CM | POA: Insufficient documentation

## 2024-08-06 NOTE — Assessment & Plan Note (Signed)
 Chronic, stable. Most recent A1c 5.9%. Continue focus on healthy eating and exercise.

## 2024-08-07 LAB — SPECIMEN STATUS REPORT

## 2024-08-07 LAB — VITAMIN B12: Vitamin B-12: 404 pg/mL (ref 232–1245)

## 2024-08-11 ENCOUNTER — Ambulatory Visit
Admission: RE | Admit: 2024-08-11 | Discharge: 2024-08-11 | Disposition: A | Source: Ambulatory Visit | Attending: Neurology

## 2024-08-11 DIAGNOSIS — G43109 Migraine with aura, not intractable, without status migrainosus: Secondary | ICD-10-CM

## 2024-08-11 MED ORDER — GADOPICLENOL 0.5 MMOL/ML IV SOLN
5.5000 mL | Freq: Once | INTRAVENOUS | Status: AC | PRN
  Administered 2024-08-11: 5.5 mL via INTRAVENOUS

## 2024-08-16 ENCOUNTER — Encounter: Payer: Self-pay | Admitting: Nurse Practitioner

## 2024-08-16 DIAGNOSIS — Z8619 Personal history of other infectious and parasitic diseases: Secondary | ICD-10-CM

## 2024-08-17 ENCOUNTER — Ambulatory Visit: Payer: Self-pay | Admitting: Neurology

## 2024-08-17 MED ORDER — ACYCLOVIR 5 % EX OINT: 1.0000 | TOPICAL_OINTMENT | CUTANEOUS | 0 refills | Status: AC

## 2024-08-17 MED ORDER — VALACYCLOVIR HCL 1 G PO TABS: 1000.0000 mg | ORAL_TABLET | ORAL | 0 refills | Status: AC | PRN

## 2024-08-17 NOTE — Addendum Note (Signed)
 Addended by: Radford Pease A on: 08/17/2024 09:04 AM   Modules accepted: Orders

## 2024-10-01 ENCOUNTER — Ambulatory Visit

## 2024-10-05 ENCOUNTER — Ambulatory Visit

## 2025-02-22 ENCOUNTER — Ambulatory Visit: Payer: Self-pay | Admitting: Neurology
